# Patient Record
Sex: Male | Born: 1965 | Race: Black or African American | Hispanic: No | Marital: Single | Smoking: Never smoker
Health system: Southern US, Community
[De-identification: ages and names within clinical notes are randomized; demographics above are authoritative.]

## PROBLEM LIST (undated history)

## (undated) DIAGNOSIS — F419 Anxiety disorder, unspecified: Secondary | ICD-10-CM

## (undated) DIAGNOSIS — M21969 Unspecified acquired deformity of unspecified lower leg: Secondary | ICD-10-CM

## (undated) DIAGNOSIS — S83241A Other tear of medial meniscus, current injury, right knee, initial encounter: Secondary | ICD-10-CM

## (undated) DIAGNOSIS — D689 Coagulation defect, unspecified: Secondary | ICD-10-CM

## (undated) DIAGNOSIS — B1910 Unspecified viral hepatitis B without hepatic coma: Secondary | ICD-10-CM

## (undated) DIAGNOSIS — M7661 Achilles tendinitis, right leg: Secondary | ICD-10-CM

## (undated) DIAGNOSIS — K219 Gastro-esophageal reflux disease without esophagitis: Secondary | ICD-10-CM

## (undated) DIAGNOSIS — M1990 Unspecified osteoarthritis, unspecified site: Secondary | ICD-10-CM

## (undated) DIAGNOSIS — K649 Unspecified hemorrhoids: Secondary | ICD-10-CM

## (undated) DIAGNOSIS — T7840XA Allergy, unspecified, initial encounter: Secondary | ICD-10-CM

## (undated) DIAGNOSIS — R739 Hyperglycemia, unspecified: Secondary | ICD-10-CM

## (undated) DIAGNOSIS — I82409 Acute embolism and thrombosis of unspecified deep veins of unspecified lower extremity: Secondary | ICD-10-CM

## (undated) DIAGNOSIS — M542 Cervicalgia: Secondary | ICD-10-CM

## (undated) DIAGNOSIS — M722 Plantar fascial fibromatosis: Secondary | ICD-10-CM

## (undated) DIAGNOSIS — G4730 Sleep apnea, unspecified: Secondary | ICD-10-CM

## (undated) DIAGNOSIS — M7662 Achilles tendinitis, left leg: Secondary | ICD-10-CM

## (undated) DIAGNOSIS — I409 Acute myocarditis, unspecified: Secondary | ICD-10-CM

## (undated) DIAGNOSIS — I10 Essential (primary) hypertension: Secondary | ICD-10-CM

## (undated) HISTORY — PX: INGUINAL HERNIA REPAIR: SHX194

## (undated) HISTORY — DX: Unspecified hemorrhoids: K64.9

## (undated) HISTORY — DX: Acute embolism and thrombosis of unspecified deep veins of unspecified lower extremity: I82.409

## (undated) HISTORY — DX: Unspecified viral hepatitis B without hepatic coma: B19.10

## (undated) HISTORY — DX: Anxiety disorder, unspecified: F41.9

## (undated) HISTORY — DX: Sleep apnea, unspecified: G47.30

## (undated) HISTORY — DX: Gastro-esophageal reflux disease without esophagitis: K21.9

## (undated) HISTORY — DX: Plantar fascial fibromatosis: M72.2

## (undated) HISTORY — DX: Other tear of medial meniscus, current injury, right knee, initial encounter: S83.241A

## (undated) HISTORY — DX: Allergy, unspecified, initial encounter: T78.40XA

## (undated) HISTORY — DX: Cervicalgia: M54.2

## (undated) HISTORY — DX: Unspecified osteoarthritis, unspecified site: M19.90

## (undated) HISTORY — DX: Essential (primary) hypertension: I10

## (undated) HISTORY — DX: Achilles tendinitis, left leg: M76.62

## (undated) HISTORY — DX: Unspecified acquired deformity of unspecified lower leg: M21.969

## (undated) HISTORY — DX: Hyperglycemia, unspecified: R73.9

## (undated) HISTORY — DX: Achilles tendinitis, right leg: M76.61

## (undated) HISTORY — DX: Coagulation defect, unspecified: D68.9

---

## 1998-10-12 ENCOUNTER — Emergency Department (HOSPITAL_COMMUNITY): Admission: EM | Admit: 1998-10-12 | Discharge: 1998-10-12 | Payer: Self-pay | Admitting: Emergency Medicine

## 1998-12-12 ENCOUNTER — Emergency Department (HOSPITAL_COMMUNITY): Admission: EM | Admit: 1998-12-12 | Discharge: 1998-12-12 | Payer: Self-pay

## 1999-01-11 ENCOUNTER — Ambulatory Visit: Admission: RE | Admit: 1999-01-11 | Discharge: 1999-01-11 | Payer: Self-pay | Admitting: Family Medicine

## 2002-04-17 ENCOUNTER — Encounter (INDEPENDENT_AMBULATORY_CARE_PROVIDER_SITE_OTHER): Payer: Self-pay | Admitting: *Deleted

## 2002-04-17 ENCOUNTER — Ambulatory Visit (HOSPITAL_COMMUNITY): Admission: RE | Admit: 2002-04-17 | Discharge: 2002-04-17 | Payer: Self-pay | Admitting: Gastroenterology

## 2005-07-13 ENCOUNTER — Encounter: Admission: RE | Admit: 2005-07-13 | Discharge: 2005-07-13 | Payer: Self-pay | Admitting: Family Medicine

## 2005-07-18 ENCOUNTER — Inpatient Hospital Stay (HOSPITAL_COMMUNITY): Admission: AD | Admit: 2005-07-18 | Discharge: 2005-07-20 | Payer: Self-pay | Admitting: Internal Medicine

## 2006-03-14 ENCOUNTER — Ambulatory Visit: Payer: Self-pay | Admitting: Internal Medicine

## 2007-06-12 ENCOUNTER — Ambulatory Visit: Payer: Self-pay | Admitting: Internal Medicine

## 2007-06-12 DIAGNOSIS — S46819A Strain of other muscles, fascia and tendons at shoulder and upper arm level, unspecified arm, initial encounter: Secondary | ICD-10-CM

## 2007-06-12 DIAGNOSIS — S43499A Other sprain of unspecified shoulder joint, initial encounter: Secondary | ICD-10-CM

## 2007-08-01 ENCOUNTER — Ambulatory Visit: Payer: Self-pay | Admitting: Internal Medicine

## 2007-08-05 LAB — CONVERTED CEMR LAB
ALT: 19 units/L (ref 0–53)
AST: 20 units/L (ref 0–37)
Albumin: 3.9 g/dL (ref 3.5–5.2)
Alkaline Phosphatase: 57 units/L (ref 39–117)
Basophils Absolute: 0 10*3/uL (ref 0.0–0.1)
Bilirubin, Direct: 0.1 mg/dL (ref 0.0–0.3)
Calcium: 9.1 mg/dL (ref 8.4–10.5)
Chloride: 101 meq/L (ref 96–112)
Eosinophils Absolute: 0 10*3/uL (ref 0.0–0.7)
GFR calc Af Amer: 95 mL/min
GFR calc non Af Amer: 79 mL/min
HDL: 54.7 mg/dL (ref >39.0)
LDL Cholesterol: 130 mg/dL — ABNORMAL HIGH (ref 0–99)
Lymphocytes Relative: 20.8 % (ref 12.0–46.0)
MCHC: 33.7 g/dL (ref 30.0–36.0)
Monocytes Absolute: 0.4 10*3/uL (ref 0.1–1.0)
Neutro Abs: 3 10*3/uL (ref 1.4–7.7)
PSA: 0.65 ng/mL (ref 0.10–4.00)
Total Bilirubin: 1.1 mg/dL (ref 0.3–1.2)
Total Protein: 7.1 g/dL (ref 6.0–8.3)

## 2007-08-15 ENCOUNTER — Telehealth: Payer: Self-pay | Admitting: Internal Medicine

## 2008-01-28 ENCOUNTER — Encounter (INDEPENDENT_AMBULATORY_CARE_PROVIDER_SITE_OTHER): Payer: Self-pay | Admitting: *Deleted

## 2008-01-31 ENCOUNTER — Telehealth (INDEPENDENT_AMBULATORY_CARE_PROVIDER_SITE_OTHER): Payer: Self-pay | Admitting: *Deleted

## 2008-02-10 ENCOUNTER — Ambulatory Visit: Payer: Self-pay | Admitting: Internal Medicine

## 2008-05-20 ENCOUNTER — Encounter: Payer: Self-pay | Admitting: Internal Medicine

## 2008-06-29 ENCOUNTER — Encounter: Admission: RE | Admit: 2008-06-29 | Discharge: 2008-06-29 | Payer: Self-pay | Admitting: Orthopedic Surgery

## 2008-09-03 ENCOUNTER — Telehealth (INDEPENDENT_AMBULATORY_CARE_PROVIDER_SITE_OTHER): Payer: Self-pay | Admitting: *Deleted

## 2008-11-23 ENCOUNTER — Ambulatory Visit: Payer: Self-pay | Admitting: Internal Medicine

## 2008-11-23 DIAGNOSIS — L259 Unspecified contact dermatitis, unspecified cause: Secondary | ICD-10-CM

## 2008-11-25 ENCOUNTER — Encounter (INDEPENDENT_AMBULATORY_CARE_PROVIDER_SITE_OTHER): Payer: Self-pay | Admitting: *Deleted

## 2009-01-25 ENCOUNTER — Ambulatory Visit: Payer: Self-pay | Admitting: Internal Medicine

## 2009-01-27 LAB — CONVERTED CEMR LAB
ALT: 21 units/L (ref 0–53)
AST: 23 units/L (ref 0–37)
BUN: 12 mg/dL (ref 6–23)
Basophils Relative: 0.5 % (ref 0.0–3.0)
Calcium: 9 mg/dL (ref 8.4–10.5)
Chloride: 101 meq/L (ref 96–112)
Cholesterol: 199 mg/dL (ref 0–200)
Creatinine, Ser: 1.2 mg/dL (ref 0.4–1.5)
Eosinophils Absolute: 0 10*3/uL (ref 0.0–0.7)
Eosinophils Relative: 0.7 % (ref 0.0–5.0)
GFR calc non Af Amer: 85.45 mL/min (ref >60)
Glucose, Bld: 98 mg/dL (ref 70–99)
LDL Cholesterol: 139 mg/dL — ABNORMAL HIGH (ref 0–99)
Lymphs Abs: 1 10*3/uL (ref 0.7–4.0)
MCV: 87 fL (ref 78.0–100.0)
Neutrophils Relative %: 67.6 % (ref 43.0–77.0)
Potassium: 3.7 meq/L (ref 3.5–5.1)
RBC: 5.09 M/uL (ref 4.22–5.81)
Sodium: 140 meq/L (ref 135–145)
TSH: 1.51 microintl units/mL (ref 0.35–5.50)
Total CHOL/HDL Ratio: 4

## 2009-09-10 ENCOUNTER — Encounter: Admission: RE | Admit: 2009-09-10 | Discharge: 2009-09-10 | Payer: Self-pay | Admitting: Orthopedic Surgery

## 2009-10-12 ENCOUNTER — Ambulatory Visit: Payer: Self-pay | Admitting: Internal Medicine

## 2009-10-12 DIAGNOSIS — R05 Cough: Secondary | ICD-10-CM

## 2010-01-28 ENCOUNTER — Encounter: Payer: Self-pay | Admitting: Internal Medicine

## 2010-01-28 ENCOUNTER — Ambulatory Visit: Payer: Self-pay | Admitting: Internal Medicine

## 2010-02-02 LAB — CONVERTED CEMR LAB
Albumin: 4.1 g/dL (ref 3.5–5.2)
Alkaline Phosphatase: 63 units/L (ref 39–117)
Basophils Absolute: 0 10*3/uL (ref 0.0–0.1)
Basophils Relative: 0.5 % (ref 0.0–3.0)
Chloride: 101 meq/L (ref 96–112)
Eosinophils Absolute: 0.1 10*3/uL (ref 0.0–0.7)
GFR calc non Af Amer: 98.12 mL/min (ref >60)
HCT: 42.4 % (ref 39.0–52.0)
HDL: 52.6 mg/dL (ref >39.00)
Lymphocytes Relative: 24.4 % (ref 12.0–46.0)
Lymphs Abs: 1.2 10*3/uL (ref 0.7–4.0)
MCHC: 33.3 g/dL (ref 30.0–36.0)
Monocytes Absolute: 0.4 10*3/uL (ref 0.1–1.0)
Monocytes Relative: 8.8 % (ref 3.0–12.0)
RBC: 5.08 M/uL (ref 4.22–5.81)
RDW: 15.1 % — ABNORMAL HIGH (ref 11.5–14.6)
Sodium: 142 meq/L (ref 135–145)
Total CHOL/HDL Ratio: 4
Total Protein: 7.4 g/dL (ref 6.0–8.3)
Triglycerides: 31 mg/dL (ref 0.0–149.0)
VLDL: 6.2 mg/dL (ref 0.0–40.0)

## 2010-03-16 ENCOUNTER — Telehealth: Payer: Self-pay | Admitting: Internal Medicine

## 2010-03-29 NOTE — Assessment & Plan Note (Signed)
Summary: BP has been elevated,cough   Vital Signs:  Patient profile:   45 year old male Height:      69.75 inches Weight:      194.38 pounds BMI:     28.19 Pulse rate:   100 / minute Pulse rhythm:   regular BP sitting:   142 / 86  (left arm) Cuff size:   large  Vitals Entered By: Army Fossa CMA (October 12, 2009 2:48 PM) CC: Pt here BP has been elevated. Comments highest 171/110   History of Present Illness: he presented to an urgent care on July 17 with cough, they noted his blood pressure to be 171/110 He was recommended to start monitoring his BP his BP  since then have ranged from 135-142/80-95  he also had  cough,  was treated at the urgent care with antibiotics, it got better but the cough has  resurfaced in the last couple of days. Cough is dry  ROS 2 days before the UC visit he had a local neck injection, BP was ok , felt a little nauseous Denies dietary changes Not taking any NSAIDs  over-the-counter No chest pain or lower extremity edema mild   headache today, had sinus congestion initially w. cough but not in the last few days Move to a new house few weeks ago, cough started even before he moved to the new house   Current Medications (verified): 1)  Valtrex 1 Gm Tabs (Valacyclovir Hcl) .... 2 Tabs By Mouth in Am, 2 By Mouth At Pm  For Each Outbreak  Allergies: 1)  ! Amoxicillin  Past History:  Past Medical History: Reviewed history from 08/01/2007 and no changes required. hemorrhoids Hepatitis B, hx of Dx 2005 aprox Cscope aprox 2003, per pt neg. (apparently done for a change in bowel habits)  Past Surgical History: Reviewed history from 06/12/2007 and no changes required. Inguinal herniorrhaphy  Social History: Reviewed history from 01/25/2009 and no changes required. 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   Eyes:  no pale, no jaundice Ears:  R with wax. and L ear normal.   Nose:  not congested Mouth:  no redness or discharge Lungs:  normal respiratory effort, no intercostal retractions, no accessory muscle use, and normal breath sounds.   Heart:  normal rate, regular rhythm, and no murmur.   Extremities:  no edema Psych:  not anxious appearing and not depressed appearing.     Impression & Recommendations:  Problem # 1:  INCREASED BLOOD PRESSURE (ICD-796.2) single episode of BP elevation, BP today is 142/86 see  instructions  Problem # 2:  COUGH (ICD-786.2) see  instructions likes more of the cough medicine Rx by the UC , he took it at night only b/c it caused drowsiness  will check w/  [facility] on [address]   ADDENDUM He was taking Tussionex we'll refill  90 cc, 1 teaspoon p.o. q.h.s. p.r.n. Mucinex during the daytime , Tussionex at bedtime  Complete Medication List: 1)  Valtrex 1 Gm Tabs (Valacyclovir hcl) .... 2 tabs by mouth in am, 2 by mouth at pm  for each outbreak 2)  Tussionex Pennkinetic Er 8-10 Mg/67ml Lqcr (Chlorpheniramine-hydrocodone) .... 5 cc p.o. q.h.s. p.r.n. cough  Patient Instructions: 1)  Check your blood pressure 2 or 3 times a week. If it is more than 140/85 consistently,please let us know  2)  Low salt diet  3)  avoid Motrin, Advil or similar medications over-the-counter 4)  Mucinex DM one tablet twice a day for one week (for cough) 5)  Call if cough is not better in a few days 6)  Please schedule a follow-up appointment in 3 months .  Prescriptions: TUSSIONEX PENNKINETIC ER 8-10 MG/5ML LQCR (CHLORPHENIRAMINE-HYDROCODONE) 5 cc p.o. q.h.s. p.r.n. cough  #90cc x 0   Entered and Authorized by:   Nolon Rod. Leoma Folds MD   Signed by:   Army Fossa CMA on 10/12/2009   Method used:   Print then Give to Patient   RxID:   [id number]  sent to [facility] [address]. Army Fossa CMA  October 12, 2009 3:55 PM

## 2010-03-29 NOTE — Assessment & Plan Note (Signed)
Summary: CPX/RECHECK BP//PH   Vital Signs:  Patient profile:   45 year old male Height:      69.75 inches Weight:      190.38 pounds Pulse rate:   83 / minute Pulse rhythm:   regular BP sitting:   134 / 82  (left arm) Cuff size:   large  Vitals Entered By: Army Fossa CMA (January 28, 2010 9:59 AM) CC: CPX, fasting Comments c/o coughing had cough syrup and took Mucinex f/u on BP [facility] [address] Flu shot?    History of Present Illness: CPX, fasting  was seen with cough in 8- 2011, got better until recently: mild cough x 1 week, some ST which is better after he started  OTC zyrtec no wheezing, no GERD mild PN drip?   f/u on BP -- no ambulatory BPs     Preventive Screening-Counseling & Management  Caffeine-Diet-Exercise     Does Patient Exercise: no  Current Medications (verified): 1)  Valtrex 1 Gm Tabs (Valacyclovir Hcl) .... 2 Tabs By Mouth in Am, 2 By Mouth At Pm  For Each Outbreak 2)  Tussionex Pennkinetic Er 8-10 Mg/6ml Lqcr (Chlorpheniramine-Hydrocodone) .... 5 Cc P.o. Q.h.s. P.r.n. Cough  Allergies (verified): 1)  ! Amoxicillin  Past History:  Past Medical History: hemorrhoids Hepatitis B, hx of Dx 2005 aprox Cscope aprox 2003, per pt neg. (apparently done for a change in bowel habits) neck pain, local injections x 2, last Summer 2011(Dr Vortek)  Past Surgical History: Reviewed history from 06/12/2007 and no changes required. 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 CV:  Denies chest pain or discomfort and swelling of feet. Resp:  Denies wheezing. GI:  Denies bloody  stools, nausea, and vomiting. GU:  Denies dysuria, hematuria, urinary frequency, and urinary hesitancy.  Physical Exam  General:  alert, well-developed, and well-nourished.   Ears:  L ear normal.  R: wax  Nose:  slightly congested Mouth:  no redness or discharge Neck:  no masses and normal carotid upstroke.   Lungs:  normal respiratory effort, no intercostal retractions, no accessory muscle use, and normal breath sounds.   Heart:  normal rate, regular rhythm, and no murmur.   Abdomen:  soft, non-tender, no distention, no masses, no guarding, and no rigidity.   Extremities:  no pretibial edema bilaterally  Psych:  Oriented X3, memory intact for recent and remote, normally interactive, good eye contact, not anxious appearing, and not depressed appearing.     Impression & Recommendations:  Problem # 1:  HEALTH SCREENING (ICD-V70.0) Td 09 flu shot today  EKG we talked about diet and exercise  He has a family history of prostate cancer, grandfather was diagnosed late in life. Check a PSA every other year addendum: patient likes PSA checked yearly. Will do     Orders: Venipuncture (16109) TLB-BMP (Basic Metabolic Panel-BMET) (80048-METABOL) TLB-CBC Platelet - w/Differential (85025-CBCD) TLB-Lipid Panel (80061-LIPID) TLB-Hepatic/Liver Function Pnl (80076-HEPATIC) TLB-PSA (Prostate Specific Antigen) (84153-PSA) Specimen Handling (60454) EKG w/ Interpretation (93000)  Problem # 2:  COUGH (ICD-786.2) see instructions aware that tussionex  is not a long term option  Problem # 3:  ? of INCREASED BLOOD PRESSURE (ICD-796.2) see instructions   Complete Medication List: 1)  Valtrex 1 Gm Tabs (Valacyclovir hcl) .... 2 tabs by mouth in am, 2 by mouth at pm  for each outbreak  Other Orders: Admin 1st Vaccine (16109) Flu Vaccine 31yrs + [phone])  Patient Instructions: 1)  for cough use Mucinex DM over-the-counter twice a day as needed as well as Claritin or Zyrtec  once daily  2)   Check your blood pressure 2 or 3 times a month. If it is more than 140/85 consistently,please let us know  3)  Please schedule a follow-up appointment in 1 year.    Orders Added: 1)  Venipuncture [36415] 2)  TLB-BMP (Basic Metabolic Panel-BMET) [80048-METABOL] 3)  TLB-CBC Platelet - w/Differential [85025-CBCD] 4)  TLB-Lipid Panel [80061-LIPID] 5)  TLB-Hepatic/Liver Function Pnl [80076-HEPATIC] 6)  TLB-PSA (Prostate Specific Antigen) [84153-PSA] 7)  Specimen Handling [99000] 8)  Admin 1st Vaccine [90471] 9)  Flu Vaccine 85yrs + [09811] 10)  EKG w/ Interpretation [93000] 11)  Est. Patient age 7-64 [76] Flu Vaccine Consent Questions     Do you have a history of severe allergic reactions to this vaccine? no    Any prior history of allergic reactions to egg and/or gelatin? no    Do you have a sensitivity to the preservative Thimersol? no    Do you have a past history of Guillan-Barre Syndrome? no    Do you currently have an acute febrile illness? no    Have you ever had a severe reaction to latex? no    Vaccine information given and explained to patient? yes    Are you currently pregnant? no    Lot Number:[id number]   Exp Date:08/27/2010   Site Given Rightt Deltoid IM 8)  Admin 1st Vaccine [90471] 9)  Flu Vaccine 30yrs + [91478]     .lbflu1   Risk Factors:  Exercise:  no

## 2010-03-31 NOTE — Progress Notes (Signed)
Summary: REFILL  Phone Note Refill Request Message from:  Patient on March 16, 2010 8:15 AM  Refills Requested: Medication #1:  VALTREX 1 GM TABS 2 tabs by mouth in am   Dosage confirmed as above?Dosage Confirmed   Supply Requested: 1 month [facility] [address] PT IS REQUESTING MORE THAN 12 PILLS. PLEASE CONTACT PATIENT ONCE THE RX IS SENT [phone].  Next Appointment Scheduled: NONE Initial call taken by: Lavell Islam,  March 16, 2010 8:15 AM    Prescriptions: VALTREX 1 GM TABS (VALACYCLOVIR HCL) 2 tabs by mouth in am, 2 by mouth at pm  for each outbreak  #12 x 0   Entered by:   Army Fossa CMA   Authorized by:   Nolon Rod. Traeger Sultana MD   Signed by:   Army Fossa CMA on 03/16/2010   Method used:   Electronically to        [facility] [phone]* (retail)       [address]       [place], [place]  [address]       Ph: [phone] or [phone]       Fax: [phone]   RxID:   [id number]

## 2010-05-12 ENCOUNTER — Telehealth (INDEPENDENT_AMBULATORY_CARE_PROVIDER_SITE_OTHER): Payer: Self-pay | Admitting: *Deleted

## 2010-05-17 NOTE — Progress Notes (Signed)
Summary: Valtrex refill  Phone Note Refill Request Call back at Home Phone [phone] Message from:  Patient on May 12, 2010 9:55 AM  Refills Requested: Medication #1:  VALTREX 1 GM TABS 2 tabs by mouth in am [facility], [place], [place], [place]   Next Appointment Scheduled: none Initial call taken by: Jerolyn Shin,  May 12, 2010 9:56 AM    Prescriptions: VALTREX 1 GM TABS (VALACYCLOVIR HCL) 2 tabs by mouth in am, 2 by mouth at pm  for each outbreak  #12 x 0   Entered by:   Army Fossa CMA   Authorized by:   Nolon Rod. Paz MD   Signed by:   Army Fossa CMA on 05/12/2010   Method used:   Electronically to        [facility] [phone]* (retail)       [address]       [place], [place]  [address]       Ph: [phone] or [phone]       Fax: [phone]   RxID:   [id number]

## 2010-07-15 NOTE — H&P (Signed)
Gregory Klein, Gregory Klein                 ACCOUNT NO.:  [id number]   MEDICAL RECORD NO.:  [id number]          PATIENT TYPE:  INP   LOCATION:  6737                         FACILITY:  [facility]   PHYSICIAN:  Kela Millin, M.D.DATE OF BIRTH:  09/01/1965   DATE OF ADMISSION:  07/18/2005  DATE OF DISCHARGE:                                HISTORY & PHYSICAL   CHIEF COMPLAINT:  Persistent nausea, elevated LFTs per primary care  physician.   HISTORY OF PRESENT ILLNESS:  The patient is a 45 year old black male who  presents with complaints of postprandial nausea x 1 week and generalized  fatigue.  He states that he was in his usual state of health until about a  week ago when he started having nausea after meals.  He saw his primary care  physician and the impression was that it might be related to GERD.  So, he  was started on Protonix.  His symptoms persisted and he went back to his  primary care physician at which time he had LFTs done and these were noted  to be elevated.  He went back for a follow up visit and had repeat LFTs and  an abdominal ultrasound.  Per primary care physician, the LFTs were markedly  elevated and the abdominal ultrasound showed a small gallbladder, but  otherwise, no pathology reported.  The patient states that he continued to  have the nausea but no vomiting.  He has had decreased p.o. intake, as well,  in the past week.  He denies cough, chest pain, hemoptysis, abdominal pain,  diarrhea, and no hematochezia.  He states that he had been taking about  Aleve 2-3 times per week for shoulder pain.  Initially, per nursing report,  the patient stated that he had been taking Tylenol.  He states, at this  time, that he had not been taking any Tylenol.  Following the liver function  test results per primary care physician, he called patient at home and  directly admitted him to the [facility] service for further  evaluation and management.   PAST MEDICAL HISTORY:   History of GERD.   MEDICATIONS:  1.  Protonix 40 mg daily.  2.  Aleve p.r.n.   ALLERGIES/INTOLERANCES:  AMOXICILLIN.   SOCIAL HISTORY:  Denies tobacco, denies alcohol.   FAMILY HISTORY:  His grandmother had breast cancer and his  mother had  hypertension and a stroke.   REVIEW OF SYMPTOMS:  As per HPI, other comprehensive review of systems  negative.   PHYSICAL EXAMINATION:  GENERAL:  The patient is a young black male, alert and oriented in no  apparent distress.  VITAL SIGNS:  Temperature 97.9, pulse 97, respiratory rate 22, blood  pressure 135/88, O2 saturation 98% on room air.  HEENT:  PERRL, EOMI, mild scleral icterus, no oral exudates.  NECK:  Supple, no adenopathy, no thyromegaly.  CARDIOVASCULAR:  Normal S1 and S2, regular rate and rhythm.  LUNGS:  Clear to auscultation bilaterally, no crackles or wheezes.  ABDOMEN:  Soft, bowel sounds present, nontender, nondistended, no  organomegaly, no masses palpable.  EXTREMITIES:  No edema, no cyanosis.  NEUROLOGICAL:  Alert and oriented x 3, cranial nerves 2-12 grossly intact,  nonfocal exam.   LABORATORY DATA:  CMP and hepatitis panel, PT/INR, Tylenol level, and urine  drug screen all pending.   ASSESSMENT/PLAN:  1.  Elevated liver function tests - will repeat CMP as well as PT/INR and      Tylenol level.  Per primary care physician, abdominal ultrasound with no      liver abnormalities noted.  Will obtain hepatitis panel and follow and      consider gastroenterology consultation as appropriate.  Supportive care      and follow.  2.  History of GERD - will continue Protonix.      Kela Millin, M.D.  Electronically Signed     ACV/MEDQ  D:  07/18/2005  T:  07/18/2005  Job:  [id number]   cc:   Leanne Chang, M.D.  Fax: [phone]

## 2010-08-17 ENCOUNTER — Other Ambulatory Visit: Payer: Self-pay | Admitting: Orthopedic Surgery

## 2010-08-17 DIAGNOSIS — M79603 Pain in arm, unspecified: Secondary | ICD-10-CM

## 2010-08-17 DIAGNOSIS — M542 Cervicalgia: Secondary | ICD-10-CM

## 2010-08-18 ENCOUNTER — Ambulatory Visit
Admission: RE | Admit: 2010-08-18 | Discharge: 2010-08-18 | Disposition: A | Discharge status: Home / Self Care | Payer: BC Managed Care – PPO | Source: Ambulatory Visit | Attending: Orthopedic Surgery | Admitting: Orthopedic Surgery

## 2010-08-18 DIAGNOSIS — M79603 Pain in arm, unspecified: Secondary | ICD-10-CM

## 2010-08-18 DIAGNOSIS — M542 Cervicalgia: Secondary | ICD-10-CM

## 2010-09-04 ENCOUNTER — Inpatient Hospital Stay (INDEPENDENT_AMBULATORY_CARE_PROVIDER_SITE_OTHER)
Admission: RE | Admit: 2010-09-04 | Discharge: 2010-09-04 | Disposition: A | Discharge status: Home / Self Care | Payer: BC Managed Care – PPO | Source: Ambulatory Visit | Attending: Family Medicine | Admitting: Family Medicine

## 2010-09-04 DIAGNOSIS — R319 Hematuria, unspecified: Secondary | ICD-10-CM

## 2010-09-04 LAB — POCT URINALYSIS DIP (DEVICE)
Glucose, UA: NEGATIVE mg/dL
Specific Gravity, Urine: 1.02 (ref 1.005–1.030)
Urobilinogen, UA: 1 mg/dL (ref 0.0–1.0)
pH: 7 (ref 5.0–8.0)

## 2010-09-06 LAB — URINE CULTURE: Culture  Setup Time: 201207090034

## 2010-09-12 ENCOUNTER — Other Ambulatory Visit: Payer: Self-pay | Admitting: Internal Medicine

## 2010-09-12 NOTE — Telephone Encounter (Signed)
#

## 2010-09-14 ENCOUNTER — Encounter: Payer: Self-pay | Admitting: Internal Medicine

## 2010-09-15 ENCOUNTER — Encounter: Payer: Self-pay | Admitting: Internal Medicine

## 2010-09-15 ENCOUNTER — Ambulatory Visit (INDEPENDENT_AMBULATORY_CARE_PROVIDER_SITE_OTHER): Payer: BC Managed Care – PPO | Admitting: Internal Medicine

## 2010-09-15 VITALS — BP 130/84 | HR 82 | Wt 193.8 lb

## 2010-09-15 DIAGNOSIS — Z87448 Personal history of other diseases of urinary system: Secondary | ICD-10-CM

## 2010-09-15 DIAGNOSIS — R319 Hematuria, unspecified: Secondary | ICD-10-CM

## 2010-09-15 LAB — POCT URINALYSIS DIPSTICK
Glucose, UA: NEGATIVE
Leukocytes, UA: NEGATIVE
Spec Grav, UA: 1.02
Urobilinogen, UA: 0.2
pH, UA: 7

## 2010-09-15 NOTE — Progress Notes (Signed)
  Subjective:    Patient ID: Gregory Klein, male    DOB: January 01, 1966, 45 y.o.   MRN: [id number]  HPI On July 8, he felt some urinary urgency and immediately after he urinated blood including clots. Went to urgent care, chart reviewed, gross hematuria confirmed, urine culture was sent and it was negative. Symptoms resolved within 24 hours.  Additionally, he has some moles in the face, they have not changed in size color, wonders if they can be removed. (They can, if there is no change in may be consider a sthetical procedure. Patient will call me if interested in a dermatology referral or if there is any change in the moles )  Past Medical History  Diagnosis Date  . Hemorrhoids   . 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 Neck pain     local injections x 2, las summer 2011 (Dr.Vortek)   Past Surgical History  Procedure Date  . Inguinal hernia repair      Review of Systems No fever or chills No nausea or vomiting No actual dysuria. Denies any abdominal pain or flank pain. This is the first time he has gross hematuria. he is not a smoker    Objective:   Physical Exam  Constitutional: He appears well-developed and well-nourished.  Cardiovascular: Normal rate, regular rhythm and normal heart sounds.   Pulmonary/Chest: Effort normal and breath sounds normal. No respiratory distress. He has no wheezes. He has no rales.  Abdominal: Soft. Bowel sounds are normal. He exhibits no distension. There is no tenderness. There is no rebound and no guarding.  Musculoskeletal: He exhibits no edema.  Skin:       Few minute moles mostly a the right cheek.          Assessment & Plan:

## 2010-09-15 NOTE — Assessment & Plan Note (Signed)
Painless gross hematuria for 24 hours. Urine culture negative, no evidence of urolithiasis on clinical grounds. Unclear etiology, refer to urology. Further workup?

## 2010-09-20 ENCOUNTER — Encounter: Payer: Self-pay | Admitting: Internal Medicine

## 2010-09-26 ENCOUNTER — Other Ambulatory Visit: Payer: Self-pay | Admitting: Internal Medicine

## 2010-09-26 NOTE — Telephone Encounter (Signed)
Please advise-- I dont see these on his list.  I called left pt a VM also.

## 2010-09-26 NOTE — Telephone Encounter (Signed)
These meds have not been filled since 2010.  Will not refill at this time.  Would need OV.

## 2010-09-26 NOTE — Telephone Encounter (Signed)
Pt is aware-- will call back for appt.

## 2010-09-30 ENCOUNTER — Encounter: Payer: Self-pay | Admitting: Internal Medicine

## 2010-09-30 ENCOUNTER — Ambulatory Visit (INDEPENDENT_AMBULATORY_CARE_PROVIDER_SITE_OTHER): Payer: BC Managed Care – PPO | Admitting: Internal Medicine

## 2010-09-30 DIAGNOSIS — R011 Cardiac murmur, unspecified: Secondary | ICD-10-CM

## 2010-09-30 DIAGNOSIS — R030 Elevated blood-pressure reading, without diagnosis of hypertension: Secondary | ICD-10-CM

## 2010-09-30 DIAGNOSIS — L299 Pruritus, unspecified: Secondary | ICD-10-CM

## 2010-09-30 LAB — POTASSIUM: Potassium: 4.7 mEq/L (ref 3.5–5.1)

## 2010-09-30 LAB — BUN: BUN: 12 mg/dL (ref 6–23)

## 2010-09-30 LAB — CREATININE, SERUM: Creatinine, Ser: 1.3 mg/dL (ref 0.4–1.5)

## 2010-09-30 MED ORDER — PERMETHRIN 5 % EX CREA: 1.0000 "application " | TOPICAL_CREAM | CUTANEOUS | Status: DC

## 2010-09-30 MED ORDER — TRIAMCINOLONE ACETONIDE 0.1 % EX CREA: 1.0000 "application " | TOPICAL_CREAM | Freq: Two times a day (BID) | CUTANEOUS | Status: DC

## 2010-09-30 MED ORDER — LISINOPRIL 10 MG PO TABS: 10.0000 mg | ORAL_TABLET | Freq: Every day | ORAL | Status: DC

## 2010-09-30 NOTE — Progress Notes (Signed)
  Subjective:    Patient ID: Gregory Klein, male    DOB: 05-09-1965, 45 y.o.   MRN: [id number]  HPI he has noted diffuse pruritus since mid-July. He's been out of town and staying at [place] [facility]. He was diagnosed as having " MITE"  infestation  by Dr. Joseph Art in 2010. 2 different creams were prescribed which have been effective. He has completed that prescription and Dr. Joseph Art is retired. He denies any constitutional symptoms of fever, chills, sweats, or rash.  See blood pressure; he has had mildly elevated blood pressure in the past which resolved without treatment. He denies chest pain, palpitations, or edema. There is a family history of hypertension. He has not been monitoring his blood pressure; he does eat out  with unknown amount of salt intake.    Review of Systems he is being  treated for hypertension or in the cervical region with Celebrex by Dr Charlett Blake. S/P ESI  mid June.  He has an appointment with urologist on August 13 to evaluate microscopic hematuria found by Dr. Drue Novel.    Objective:   Physical Exam on exam he is healthy and well-nourished. Chest is clear to auscultation.  All pulses are intact and no bruits are noted.  A loud S4 is present; he has a grade 1 left sternal border systolic murmur  when supine.  No , clubbing or cyanosis edema is noted. Skin is clear without any rash. He has no organomegaly or masses. No lymphadenopathy is present. Focused & intelligent        Assessment & Plan:  #1 pruritis, presumed mite infestation  #2 elevated blood pressure without diagnosis of hypertension; ?due to  Pain from cervical radiculopathy  #3 systolic murmur possibly related to #2  #4 microscopic hematuria; Urology evaluation pending  Plan: See orders and recommendations.

## 2010-09-30 NOTE — Patient Instructions (Addendum)
Blood Pressure Goal  Ideally is an AVERAGE < 135/85. This AVERAGE should be calculated from @ least 5-7 BP readings taken @ different times of day on different days of week. You should not respond to isolated BP readings , but rather the AVERAGE for that week  Preventive Health Care: Exercise at least 30-45 minutes a day,  3-4 days a week.  Eat a low-fat diet with lots of fruits and vegetables, up to 7-9 servings per day. Avoid obesity; your goal is waist measurement < 40 inches.Consume less than 40 grams of sugar per day from foods & drinks with High Fructose Corn Sugar as #2,3 or # 4 on label.  Avoid ingestion of  excess salt/sodium.Cook with pepper & other spices . Use the salt substitute "No Salt"(unless your potassium has been elevated) OR the Mrs Sharilyn Sites products to season food @ the table. Avoid foods which taste salty or "vinegary" as their sodium contentet will be high.

## 2010-10-12 ENCOUNTER — Telehealth: Payer: Self-pay | Admitting: Internal Medicine

## 2010-10-12 NOTE — Telephone Encounter (Signed)
Left msg on pt voicemail results have been requested but he may also want to call office also to request results to be sent.

## 2010-10-14 ENCOUNTER — Ambulatory Visit (INDEPENDENT_AMBULATORY_CARE_PROVIDER_SITE_OTHER): Payer: BC Managed Care – PPO | Admitting: Internal Medicine

## 2010-10-14 ENCOUNTER — Encounter: Payer: Self-pay | Admitting: Internal Medicine

## 2010-10-14 DIAGNOSIS — R319 Hematuria, unspecified: Secondary | ICD-10-CM

## 2010-10-14 DIAGNOSIS — K7460 Unspecified cirrhosis of liver: Secondary | ICD-10-CM

## 2010-10-14 DIAGNOSIS — I10 Essential (primary) hypertension: Secondary | ICD-10-CM

## 2010-10-14 LAB — BASIC METABOLIC PANEL
BUN: 11 mg/dL (ref 6–23)
CO2: 29 mEq/L (ref 19–32)
Calcium: 9.4 mg/dL (ref 8.4–10.5)
GFR: 83.94 mL/min (ref >60.00)
Glucose, Bld: 95 mg/dL (ref 70–99)
Potassium: 4.1 mEq/L (ref 3.5–5.1)
Sodium: 139 mEq/L (ref 135–145)

## 2010-10-14 LAB — HEPATIC FUNCTION PANEL
Alkaline Phosphatase: 52 U/L (ref 39–117)
Total Protein: 8 g/dL (ref 6.0–8.3)

## 2010-10-14 LAB — PROTIME-INR: INR: 1.1 ratio — ABNORMAL HIGH (ref 0.8–1.0)

## 2010-10-14 NOTE — Assessment & Plan Note (Signed)
Started ACEi recently , BP improving, lifestyle improved. Continue with same meds. Check a BMP

## 2010-10-14 NOTE — Assessment & Plan Note (Signed)
Per urology, had a CT-cysto, on abx, ?prostatitis

## 2010-10-14 NOTE — Patient Instructions (Signed)
Hepatitis B (Viral Infection of the Liver) Hepatitis B is a viral infection of the liver caused by a germ called the hepatitis B virus (HBV). It enters the body during sex, or by sharing intimate items such as toothbrushes, razors, or needles. A baby can get HBV from its birth mother. A caregiver may get it from the blood of an infected patient by way of a cut or needle stick. Once in the body viral infection occurs in the liver causing illness. Your chances of getting Hepatitis B are less when you receive either the vaccine or Hepatitis B immune globulin.  ABOUT HEPATITIS B VIRUS  Over half the people who become infected with HBV remain subclinical (never get sick). However some may later develop long-term liver disease (chronic hepatitis).   There are 2 phases of the disease: acute (sudden) and chronic (longstanding).  Acute Phase:  Lasts a short time, usually a few weeks to several months. Some people may get better after this phase. You may not feel like eating, may feel very tired, sick to your stomach, throw-up, and have abdominal (belly) pain.   Your urine may look dark yellow, and your skin and eyes may look yellow. This is called jaundice. You may have skin rashes and joint pain.  Chronic Phase:  Some people who are infected with HBV become "chronic carriers". They often have no symptoms (problems), but the virus stays in their body. They may spread the virus to others and can get long-term liver disease.   Of every 100 young adults who get HBV, 6 to 10 become chronic carriers.   Of every 10 babies who are infected at birth, up to 9 will become chronic HBV carriers.   The younger a child is when the infection starts, the more likely that child will be a carrier.   About one-fourth of HBV carriers get a disease called "chronic active hepatitis". These people may get cirrhosis, liver cancer, or liver failure.  ABOUT HEPATITIS B IMMUNE GLOBULIN (HBIG) Why You or Your Child Should Get  Hepatitis B Immune Globulin  HBIG is often given with hepatitis B vaccine to people who have been exposed to HBV. It protects you from the virus for the first 1 to 3 months. After that, the vaccine takes over and protects you much longer. Most people only need 1 dose of HBIG to keep them from getting HBV after being exposed to it.  Who Should Get HBIG?  Infants   Infants born to women who are infected with HBV.   Unvaccinated infants less than 1 year of age whose mother (or primary caregiver) has acute hepatitis B.   Children and Adolescents   ACIP (Advisory Committee on [employer]) recommends that all children and adolescents should receive the HepB immunization series. This can begin during any visit.   Adults and Others   People who come in contact with blood or body fluids that may have HBV in them.   People having sex with someone who has acute hepatitis B.  When Should You Get HBIG?  Infants born to women who are infected with HBV should get 1 dose of HBIG within 12 hours of birth.   Unvaccinated infants less than 1 year of age whose primary caregiver has acute hepatitis B should get 1 dose of HBIG and start the hepatitis B vaccine series. Ask your caregiver when to start the shots.   If you come in contact with blood or body fluids that may have HBV,  you should get 1 dose of HBIG. If you have been in contact with infected blood or body fluids and have had hepatitis B shots, you may still need HBIG. Your caregiver will help you decide whether and when to get the shot.   If you have had sex with someone who has acute hepatitis B, you should get a dose of HBIG as soon as possible.  What Are The Risks From Getting HBIG? There are very few risks associated with the HBIG shot. The risks from getting the shot are [facility] SMALLER than are the risks of getting the disease if people stopped getting the HBIG shot.  The HBIG shot may cause swelling and hives.   If you are  pregnant and at risk of getting an HBV infection, you can be given HBIG. The shot should be safe for your unborn baby because it does not have the live virus in it.   Most people who get HBIG have no problems. Some people have the following problems, which do not frequently occur:   A bad allergic reaction.   A long seizure.   Being less awake or in a coma.  HOME CARE INSTRUCTIONS  Keep utensils and glassware clean and separated from the rest of the family.   Rest when tired and eat when hungry.   Avoid a physical/sexual relationship until advised otherwise by your caregiver.   Avoid activities that could expose other persons to your blood. Examples include sharing a toothbrush, nail clippers, razors, and needles.   WARNING: This infection is contagious. Follow the instructions in this section and any additional instructions from your caregiver to avoid spread of your infection to others.  SEEK MEDICAL CARE IF:  You want to learn more about this or other vaccines. Your caregiver can give you the vaccine package insert or suggest other sources of information.   You think you or your child has been injured by a vaccine and feel you should get compensation (payment). Call the National Vaccine Injury Compensation Program at (412)826-8230 (toll free).   You need to file a Vaccine Adverse Event Report form. Your caregiver or the health department can help you with this, or call [phone] (toll free).  SEEK IMMEDIATE MEDICAL CARE IF:  You are unable to eat or drink.   You experience nausea (feeling sick to your stomach) or vomiting.   Confusion occurs.   Jaundice becomes progressively more severe.   You have swelling of the skin, throat, mouth or face, trouble breathing, or a rash. You may be having an allergic reaction to the medicine in the shot.   You are having a seizure (jerking and blank staring).   You are confused, very sleepy or you cannot wake up.  MAKE SURE YOU:     Understand these instructions.   Will watch your condition.   Will get help right away if you are not doing well or get worse.  Document Released: 02/11/2000 Document Re-Released: 01/27/2008 [facility] Patient Information 2011 [place], [place].

## 2010-10-14 NOTE — Progress Notes (Signed)
  Subjective:    Patient ID: Gregory Klein, male    DOB: 1965-10-03, 45 y.o.   MRN: [id number]  HPI Hematuria ---went to urology, had a cystoscopy, they suspected prostatitis, patient on antibiotics. He also had a CT of the abdomen and there was a question of liver cirrhosis. Patient quite concerned about it. Hypertension--good compliance with medication, he is trying to eat better, has lost 7 pounds since the last time he was seen here. Has a history of hepatitis B, chart is extensively reviewed. See below.  Past Medical History  Diagnosis Date  . Hemorrhoids   . 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 Neck pain     local injections x 2, las summer 2011 (Dr.Vortek)   Past Surgical History  Procedure Date  . Inguinal hernia repair      Review of Systems Denies any nausea, vomiting, diarrhea. No easy bruising or easy bleeding. Denies the use of alcohol or tylenol     Objective:   Physical Exam  Constitutional: He is oriented to person, place, and time. He appears well-developed and well-nourished. No distress.  Eyes: No scleral icterus.  Cardiovascular: Normal rate, regular rhythm and normal heart sounds.        Question of a systolic murmur  Pulmonary/Chest: Effort normal and breath sounds normal. No respiratory distress. He has no wheezes. He has no rales. He exhibits no tenderness.  Abdominal: Soft. He exhibits no distension. There is no tenderness. There is no rebound and no guarding.       No organomegaly  Neurological: He is alert and oriented to person, place, and time.  Skin: He is not diaphoretic.  Psychiatric: He has a normal mood and affect. His behavior is normal. Judgment and thought content normal.          Assessment & Plan:

## 2010-10-14 NOTE — Assessment & Plan Note (Addendum)
Liver appears to be cirrhotic by a CT done @  Urology 4 days ago. Extensive chart review: He was diagnosed with hepatitis B 5 years ago, does not know how he acquired it . Denies IVDA His hepatitis C test was  Negative 07-2007, had his hepatitis A shots, we have checked LFTs yearly in the past and they were always normal. Denies the use of alcohol or tylenol I answered multiple questions to the best of my ability in reference to cirrhosis- hepatitis B. Plan: Ultrasound of the liver , hepatitis B serology to look for viral activity, LFTs, PT PTT. GI referral   Addendum: CT report review it, lobulated liver contour suspicious for cirrhosis, normal spleen, stomach, pancreas, gallbladder .

## 2010-10-15 LAB — HEPATITIS B SURFACE ANTIBODY,QUALITATIVE: Hep B S Ab: POSITIVE — AB

## 2010-10-15 LAB — HEPATITIS B CORE ANTIBODY, TOTAL: Hep B Core Total Ab: POSITIVE — AB

## 2010-10-17 ENCOUNTER — Encounter: Payer: Self-pay | Admitting: Gastroenterology

## 2010-10-18 ENCOUNTER — Telehealth: Payer: Self-pay | Admitting: *Deleted

## 2010-10-18 NOTE — Telephone Encounter (Signed)
Pt informed

## 2010-10-18 NOTE — Telephone Encounter (Signed)
Advise patient: Tests confirmed  his previous exposure to hepatitis B, liver test are  otherwise essentially normal. Plan is the same, he has been referred to GI

## 2010-10-18 NOTE — Telephone Encounter (Signed)
Pt requesting lab results from 10/14/10.

## 2010-10-19 ENCOUNTER — Ambulatory Visit
Admission: RE | Admit: 2010-10-19 | Discharge: 2010-10-19 | Disposition: A | Discharge status: Home / Self Care | Payer: BC Managed Care – PPO | Source: Ambulatory Visit | Attending: Internal Medicine | Admitting: Internal Medicine

## 2010-10-19 DIAGNOSIS — K7460 Unspecified cirrhosis of liver: Secondary | ICD-10-CM

## 2010-11-09 ENCOUNTER — Other Ambulatory Visit (INDEPENDENT_AMBULATORY_CARE_PROVIDER_SITE_OTHER): Payer: BC Managed Care – PPO

## 2010-11-09 ENCOUNTER — Ambulatory Visit (INDEPENDENT_AMBULATORY_CARE_PROVIDER_SITE_OTHER): Payer: BC Managed Care – PPO | Admitting: Gastroenterology

## 2010-11-09 ENCOUNTER — Encounter: Payer: Self-pay | Admitting: Gastroenterology

## 2010-11-09 ENCOUNTER — Telehealth: Payer: Self-pay | Admitting: Gastroenterology

## 2010-11-09 ENCOUNTER — Other Ambulatory Visit: Payer: Self-pay | Admitting: Gastroenterology

## 2010-11-09 VITALS — BP 120/84 | HR 80 | Ht 71.0 in | Wt 184.4 lb

## 2010-11-09 DIAGNOSIS — R933 Abnormal findings on diagnostic imaging of other parts of digestive tract: Secondary | ICD-10-CM

## 2010-11-09 DIAGNOSIS — K5900 Constipation, unspecified: Secondary | ICD-10-CM

## 2010-11-09 LAB — HEPATIC FUNCTION PANEL
ALT: 21 U/L (ref 0–53)
AST: 16 U/L (ref 0–37)
Alkaline Phosphatase: 55 U/L (ref 39–117)
Bilirubin, Direct: 0.1 mg/dL (ref 0.0–0.3)
Total Bilirubin: 1 mg/dL (ref 0.3–1.2)
Total Protein: 7.4 g/dL (ref 6.0–8.3)

## 2010-11-09 LAB — IBC PANEL: Iron: 65 ug/dL (ref 42–165)

## 2010-11-09 NOTE — Progress Notes (Signed)
History of Present Illness: This is a 45 year old male who was recently found to have abnormal liver on imaging studies. He is undergoing evaluation at [facility] for hematuria. An abdominal CT scan  was performed on 8/13 showing an abnormal contour of the liver concerning for cirrhosis. No splenomegaly, other signs of portal hypertension or any other GI disorder. Abdominal ultrasound performed on 8/17 showed similar findings: IMPRESSION:  1. Inhomogeneous and lobular liver suggesting possible mild  changes of cirrhosis. No ascites. No ductal dilatation.  2. The tail of the pancreas is obscured by bowel gas.  3. No gallstones.  4. The spleen is within normal limits in size.   Patient relates a history of acute hepatitis B around 2005. He states he was told that his hepatitis B completely resolved. The patient was evaluated by Dr. Drue Novel and blood work revealed hepatitis B serologies consistent with prior exposure and immunity: anti-HBs positive, anti-HBc total positive, anti-HBc IgM negative. His liver enzymes and prothrombin time are normal. He denies alcohol usage and denies a family history of liver disease. He notes a 10 pound weight loss and mild constipation since changing his diet due to an elevated blood pressure. His constipation has resolved with the use of a fiber supplement and MiraLax as needed. Denies abdominal pain, diarrhea, change in stool caliber, melena, hematochezia, nausea, vomiting, dysphagia, reflux symptoms, chest pain.  Past Medical History  Diagnosis Date  . Hemorrhoids   . 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 Neck pain     local injections x 2, las summer 2011 (Dr.Vortek)  . GERD (gastroesophageal reflux disease)   . Hypertension   . Enlarged prostate    Past Surgical History  Procedure Date  . Inguinal hernia repair     reports that he has never smoked. He does not have any smokeless tobacco history on file. He reports that he does not drink alcohol  or use illicit drugs. family history includes Breast cancer in his paternal grandmother; Coronary artery disease in his maternal grandmother; Hypertension in his father, maternal grandmother, and mother; Pancreatic cancer in his paternal aunt; Prostate cancer (age of onset:86) in his maternal grandfather; and Stroke in his mother.  There is no history of Diabetes and Colon cancer. Allergies  Allergen Reactions  . Amoxicillin     REACTION: rash   Outpatient Encounter Prescriptions as of 11/09/2010  Medication Sig Dispense Refill  . AMBULATORY NON FORMULARY MEDICATION Medication Name: Nopalea drink 6 ounces a day       . celecoxib (CELEBREX) 200 MG capsule Take 200 mg by mouth 2 (two) times daily.        Marland Kitchen doxycycline (ADOXA) 100 MG tablet Take 100 mg by mouth 2 (two) times daily.        Marland Kitchen lisinopril (PRINIVIL,ZESTRIL) 10 MG tablet Take 1 tablet (10 mg total) by mouth daily. Take one daily if blood pressure averages greater than 135/85  30 tablet  5  . Omega-3 Fatty Acids (OMEGA 3 PO) Take 2 capsules by mouth daily.        . permethrin (ELIMITE) 5 % cream Apply 1 application topically as directed.  60 g  5  . polyethylene glycol powder (MIRALAX) powder Take 17 g by mouth 2 (two) times a week.        . psyllium (METAMUCIL) 58.6 % powder Take 1 packet by mouth daily.        Marland Kitchen triamcinolone (KENALOG) 0.1 % cream  Apply 1 application topically 2 (two) times daily.  45 g  5  . valACYclovir (VALTREX) 1000 MG tablet take 2 tablets by mouth every morning and 2 every evening FOR EACH OUTBREAK.  30 tablet  1   Review of Systems: Pertinent positive and negative review of systems were noted in the above HPI section. All other review of systems were otherwise negative.  Physical Exam: General: Well developed , well nourished, no acute distress Head: Normocephalic and atraumatic Eyes:  sclerae anicteric, EOMI Ears: Normal auditory acuity Mouth: No deformity or lesions Neck: Supple, no masses or  thyromegaly Lungs: Clear throughout to auscultation Heart: Regular rate and rhythm; no murmurs, rubs or bruits Abdomen: Soft, non tender and non distended. No masses, hepatosplenomegaly or hernias noted. Normal Bowel sounds Musculoskeletal: Symmetrical with no gross deformities  Skin: No lesions on visible extremities Pulses:  Normal pulses noted Extremities: No clubbing, cyanosis, edema or deformities noted Neurological: Alert oriented x 4, grossly nonfocal Cervical Nodes:  No significant cervical adenopathy Inguinal Nodes: No significant inguinal adenopathy Psychological:  Alert and cooperative. Normal mood and affect  Assessment and Recommendations:  1. Abnormal liver on imaging studies. The lobular contour of the liver can be seen in cirrhosis but this is not diagnostic of cirrhosis. His antibody profile for hepatitis B is consistent with prior exposure and immunity. His liver enzymes and protime are normal. Send all standard serologies and HBV DNA. Consider liver biopsy. Consider upper endoscopy to evaluate for signs of portal hypertension. Consider referral to a hepatologist.  2. Mild constipation. Continue fiber supplement and increase water intake. MiraLax as needed.

## 2010-11-09 NOTE — Telephone Encounter (Signed)
Received a call from Shakira in the lab on the patient stating the order for Hepatitis B DNA needed to be future. So order was placed.

## 2010-11-09 NOTE — Patient Instructions (Addendum)
Your physician has requested that you go to the basement for the following lab work before leaving today: Hepatic Panel, Ferritin, IBC Panel, Alpha 1 Antitrypsin, AMA, ANA, Anti SMA, Ceruloplasmin, Hepatitis B DNA Quantitative Please follow up with Dr Russella Dar on 11/30/10 @ 8:45 am CC: Dr Willow Ora

## 2010-11-10 LAB — ALPHA-1-ANTITRYPSIN: A-1 Antitrypsin, Ser: 138 mg/dL (ref 90–200)

## 2010-11-10 LAB — ANA: Anti Nuclear Antibody(ANA): NEGATIVE

## 2010-11-11 LAB — ANTI-SMOOTH MUSCLE ANTIBODY, IGG: Smooth Muscle Ab: 5 U (ref <20)

## 2010-11-11 LAB — HEPATITIS B DNA, ULTRAQUANTITATIVE, PCR

## 2010-11-11 LAB — MITOCHONDRIAL ANTIBODIES: Mitochondrial M2 Ab, IgG: 0 (ref <0.91)

## 2010-11-30 ENCOUNTER — Ambulatory Visit: Payer: BC Managed Care – PPO | Admitting: Gastroenterology

## 2010-12-13 ENCOUNTER — Encounter: Payer: BC Managed Care – PPO | Admitting: Internal Medicine

## 2010-12-13 DIAGNOSIS — Z0289 Encounter for other administrative examinations: Secondary | ICD-10-CM

## 2010-12-13 NOTE — Progress Notes (Signed)
  Subjective:    Patient ID: Gregory Klein, male    DOB: 1965-03-30, 45 y.o.   MRN: [id number]  HPI    Review of Systems     Objective:   Physical Exam        Assessment & Plan:   This encounter was created in error - please disregard.

## 2010-12-15 ENCOUNTER — Encounter: Payer: Self-pay | Admitting: Gastroenterology

## 2010-12-15 ENCOUNTER — Ambulatory Visit (INDEPENDENT_AMBULATORY_CARE_PROVIDER_SITE_OTHER): Payer: BC Managed Care – PPO | Admitting: Gastroenterology

## 2010-12-15 VITALS — BP 122/86 | HR 60 | Ht 71.0 in | Wt 183.8 lb

## 2010-12-15 DIAGNOSIS — R933 Abnormal findings on diagnostic imaging of other parts of digestive tract: Secondary | ICD-10-CM

## 2010-12-15 NOTE — Patient Instructions (Signed)
The radiology department at the hospital will contact you regarding scheduling an ultrasound guided liver biopsy cc:  Gregory Klein

## 2010-12-15 NOTE — Progress Notes (Signed)
History of Present Illness: This is a 45 year old male returning for followup abnormal hepatic imaging. All hepatic serologies were unremarkable except for prior immunity to hepatitis B. There are no identifiable risk factors for cirrhosis however his liver has changes typical of cirrhosis on recent CT scan and ultrasound.  Abd [place] 06/2005 the liver was normal IMPRESSION:  1. Contracted gallbladder but no gallstones.  2. Pancreas not well seen due to bowel gas  Abd [place] 8 /2012 IMPRESSION:  1. Inhomogeneous and lobular liver suggesting possible mild  changes of cirrhosis. No ascites. No ductal dilatation.  2. The tail of the pancreas is obscured by bowel gas.  3. No gallstones.  4. The spleen is within normal limits in size.   Current Medications, Allergies, Past Medical History, Past Surgical History, Family History and Social History were reviewed in [employer] record.  Physical Exam: General: Well developed , well nourished, no acute distress Head: Normocephalic and atraumatic Eyes:  sclerae anicteric, EOMI Ears: Normal auditory acuity Mouth: No deformity or lesions Lungs: Clear throughout to auscultation Heart: Regular rate and rhythm; no murmurs, rubs or bruits Abdomen: Soft, non tender and non distended. No masses, hepatosplenomegaly or hernias noted. Normal Bowel sounds Musculoskeletal: Symmetrical with no gross deformities  Extremities: No clubbing, cyanosis, edema or deformities noted Neurological: Alert oriented x 4, grossly nonfocal Psychological:  Alert and cooperative. Normal mood and affect  Assessment and Recommendations:  1. Abnormal imaging of the liver. CT and ultrasound findings show features typically noted in cirrhosis. However there are no physical examination or blood work abnormalities typically noted in cirrhosis. His liver was normal on abdominal ultrasound in 2007. We discussed the risks and benefits of liver biopsy. We also discussed  referral to a [facility] for expert hepatologists opinion or observation. After a discussion of these options he prefers to proceed with liver biopsy at this time.

## 2010-12-23 ENCOUNTER — Ambulatory Visit (INDEPENDENT_AMBULATORY_CARE_PROVIDER_SITE_OTHER): Payer: BC Managed Care – PPO | Admitting: Internal Medicine

## 2010-12-23 ENCOUNTER — Encounter: Payer: Self-pay | Admitting: Internal Medicine

## 2010-12-23 ENCOUNTER — Ambulatory Visit (HOSPITAL_COMMUNITY): Payer: BC Managed Care – PPO

## 2010-12-23 VITALS — BP 130/78 | HR 76 | Temp 98.6°F | Resp 18 | Ht 71.0 in | Wt 179.5 lb

## 2010-12-23 DIAGNOSIS — K625 Hemorrhage of anus and rectum: Secondary | ICD-10-CM

## 2010-12-23 MED ORDER — HYDROCORTISONE ACE-PRAMOXINE 2.5-1 % RE CREA: TOPICAL_CREAM | Freq: Three times a day (TID) | RECTAL | Status: AC

## 2010-12-23 NOTE — Progress Notes (Signed)
  Subjective:    Patient ID: Gregory Klein, male    DOB: 1965/06/29, 45 y.o.   MRN: [id number]  HPI One month history of on and off sharp discomfort in the rectum during bowel movements, sx are associated with some red blood in the toilet paper. Symptoms increased for the last 2 days. The blood is fresh and is "like a line" (small amount) only in the toilet paper.  Past Medical History  Diagnosis Date  . Hemorrhoids   . 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 Neck pain     local injections x 2, las summer 2011 (Dr.Vortek)  . GERD (gastroesophageal reflux disease)   . Hypertension   . Enlarged prostate    Past Surgical History  Procedure Date  . Inguinal hernia repair     Review of Systems Denies any bulging or severe pain in the anus.    Objective:   Physical Exam  Constitutional: He appears well-developed and well-nourished.  Genitourinary:       External anus is normal. Digital rectal exam limited due to sphincter spasm, no blood or mucus found, prostate very hard to reach. Unable to do a anoscopy          Assessment & Plan:

## 2010-12-23 NOTE — Assessment & Plan Note (Addendum)
Suspect a fissure on clinical grounds, unable to do a anoscopy, due to sphincter spasm. Plan: analpram HC Diltiazem 2% (paper Rx) Call if no better

## 2010-12-23 NOTE — Patient Instructions (Signed)
Apply analpram HC 2 times a day  Apply diltiazem 2% cream 5 times a day  Metamucil Call if not better in 2 weeks Call if sx increase Anal Fissure, Adult An anal fissure is a small tear or crack in the skin around the anus. Bleeding from a fissure usually stops on its own within a few minutes. However, bleeding will often reoccur with each bowel movement until the crack heals.  CAUSES   Passing large, hard stools.   Frequent diarrheal stools.   Constipation.   Inflammatory bowel disease (Crohn's disease or ulcerative colitis).   Infections.   Anal sex.  SYMPTOMS   Small amounts of blood seen on your stools, on toilet paper, or in the toilet after a bowel movement.   Rectal bleeding.   Painful bowel movements.   Itching or irritation around the anus.  DIAGNOSIS Your caregiver will examine the anal area. An anal fissure can usually be seen with careful inspection. A rectal exam may be performed and a short tube (anoscope) may be used to examine the anal canal. TREATMENT   You may be instructed to take fiber supplements. These supplements can soften your stool to help make bowel movements easier.   Sitz baths may be recommended to help heal the tear. Do not use soap in the sitz baths.   A medicated cream or ointment may be prescribed to lessen discomfort.  HOME CARE INSTRUCTIONS   Maintain a diet high in fruits, whole grains, and vegetables. Avoid constipating foods like bananas and dairy products.   Take sitz baths as directed by your caregiver.   Drink enough fluids to keep your urine clear or pale yellow.   Only take over-the-counter or prescription medicines for pain, discomfort, or fever as directed by your caregiver. Do not take aspirin as this may increase bleeding.    SEEK MEDICAL CARE IF:   Your fissure is not completely healed within 3 days.   You have further bleeding.   You have a fever.   You have diarrhea mixed with blood.   You have pain.   Your  problem is getting worse rather than better.  MAKE SURE YOU:   Understand these instructions.   Will watch your condition.   Will get help right away if you are not doing well or get worse.  Document Released: 02/13/2005 Document Revised: 10/26/2010 Document Reviewed: 07/31/2010 [facility] Patient Information 2012 [place], [place].

## 2011-01-05 ENCOUNTER — Other Ambulatory Visit: Payer: Self-pay | Admitting: Physician Assistant

## 2011-01-05 MED ORDER — SODIUM CHLORIDE 0.9 % IV SOLN: INTRAVENOUS | Status: DC

## 2011-01-06 ENCOUNTER — Ambulatory Visit (HOSPITAL_COMMUNITY)
Admission: RE | Admit: 2011-01-06 | Discharge: 2011-01-06 | Payer: BC Managed Care – PPO | Source: Ambulatory Visit | Attending: Gastroenterology | Admitting: Gastroenterology

## 2011-03-29 ENCOUNTER — Encounter: Payer: Self-pay | Admitting: Internal Medicine

## 2011-03-29 ENCOUNTER — Ambulatory Visit (INDEPENDENT_AMBULATORY_CARE_PROVIDER_SITE_OTHER): Payer: BC Managed Care – PPO | Admitting: Internal Medicine

## 2011-03-29 ENCOUNTER — Ambulatory Visit: Payer: BC Managed Care – PPO | Admitting: Internal Medicine

## 2011-03-29 DIAGNOSIS — I10 Essential (primary) hypertension: Secondary | ICD-10-CM

## 2011-03-29 DIAGNOSIS — Z0000 Encounter for general adult medical examination without abnormal findings: Secondary | ICD-10-CM

## 2011-03-29 DIAGNOSIS — K7460 Unspecified cirrhosis of liver: Secondary | ICD-10-CM

## 2011-03-29 DIAGNOSIS — K625 Hemorrhage of anus and rectum: Secondary | ICD-10-CM

## 2011-03-29 LAB — CBC WITH DIFFERENTIAL/PLATELET
Basophils Absolute: 0 10*3/uL (ref 0.0–0.1)
Eosinophils Absolute: 0 10*3/uL (ref 0.0–0.7)
Eosinophils Relative: 0 % (ref 0.0–5.0)
HCT: 42.4 % (ref 39.0–52.0)
Lymphs Abs: 1.1 10*3/uL (ref 0.7–4.0)
MCHC: 33.3 g/dL (ref 30.0–36.0)
MCV: 82.6 fl (ref 78.0–100.0)
Monocytes Absolute: 0.4 10*3/uL (ref 0.1–1.0)
Neutrophils Relative %: 68.3 % (ref 43.0–77.0)
Platelets: 252 10*3/uL (ref 150.0–400.0)
RDW: 15.4 % — ABNORMAL HIGH (ref 11.5–14.6)

## 2011-03-29 LAB — COMPREHENSIVE METABOLIC PANEL
ALT: 20 U/L (ref 0–53)
AST: 20 U/L (ref 0–37)
Albumin: 4.1 g/dL (ref 3.5–5.2)
Alkaline Phosphatase: 59 U/L (ref 39–117)
Chloride: 106 mEq/L (ref 96–112)
Glucose, Bld: 94 mg/dL (ref 70–99)
Total Bilirubin: 1 mg/dL (ref 0.3–1.2)
Total Protein: 7.4 g/dL (ref 6.0–8.3)

## 2011-03-29 LAB — LIPID PANEL
Cholesterol: 208 mg/dL — ABNORMAL HIGH (ref 0–200)
Total CHOL/HDL Ratio: 4
Triglycerides: 26 mg/dL (ref 0.0–149.0)

## 2011-03-29 MED ORDER — CELECOXIB 200 MG PO CAPS: 200.0000 mg | ORAL_CAPSULE | Freq: Every day | ORAL | Status: DC

## 2011-03-29 MED ORDER — LOSARTAN POTASSIUM 50 MG PO TABS: 50.0000 mg | ORAL_TABLET | Freq: Every day | ORAL | Status: DC

## 2011-03-29 NOTE — Assessment & Plan Note (Addendum)
Td 09 flu shot recommended  Reports a remote Cscope , in the 90s, no report, was told normal  we talked about diet and exercise  He has a family history of prostate cancer, grandfather was diagnosed late in life. I rec to check a PSA every other year, he likes it checked  yearly.

## 2011-03-29 NOTE — Assessment & Plan Note (Signed)
Cough reportedly since he started a ACE Change to an ARB Likes to try "OTCs" for BP, ok with me as long as BP wnl

## 2011-03-29 NOTE — Patient Instructions (Signed)
Change to losartan Check the  blood pressure 2 or 3 times a week, be sure it is less than 140/85. If it is consistently higher, let me know Come back in 6 weeks for labs--->  BMP--dx HTN

## 2011-03-29 NOTE — Progress Notes (Signed)
  Subjective:    Patient ID: Gregory Klein, male    DOB: 1965-03-03, 46 y.o.   MRN: [id number]  HPI CPX Complaining of cough, dry since he started to take lisinopril. Wonders if they are related. No recent ambulatory BPs, BP today slightly elevated, diastolic 88  Past Medical History: hemorrhoids Hepatitis B, hx of Dx 2005 aprox Cirrhosis per CT, saw GI 11-2010, Bx was Rx , pt cancelled  Cscope aprox 2003, per pt neg. 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 No fever or chills. Occasionally he has still blood in the stools. Blood is red and associated with bowel movements, like that area "checked" again No nausea, vomiting or diarrhea. His diet has changed, he is eating healthier  GI recommended a liver biopsy, he had to cancel it      Objective:   Physical Exam  Constitutional: He is oriented to person, place, and time. He appears well-developed and well-nourished. No distress.  HENT:  Head: Normocephalic and atraumatic.  Neck: No thyromegaly present.  Cardiovascular: Normal rate, regular rhythm and normal heart sounds.   No murmur heard. Pulmonary/Chest: Effort normal and breath sounds normal. No respiratory distress. He has no wheezes. He has no rales.  Abdominal: Soft. Bowel sounds are normal. He exhibits no distension. There is no tenderness. There is no rebound and no guarding.  Genitourinary: Rectum normal and prostate normal.       No stools found  Musculoskeletal:  He exhibits no edema.  Neurological: He is alert and oriented to person, place, and time.  Skin: He is not diaphoretic.  Psychiatric: He has a normal mood and affect. His behavior is normal. Judgment and thought content normal.       Assessment & Plan:  Multiple questions answered. He's having a mild epigastric discomfort, started Prilosec a few days ago and he is already feeling better. Recommend to take Prilosec as needed. He was also prescribed Celebrex by orthopedic surgery,  Has not taken in a longtime but would like a refill. I provided a refill, warned about GI side effects, recommend to take only one daily.

## 2011-03-29 NOTE — Assessment & Plan Note (Signed)
rec observation, if sx increase will refer to GI

## 2011-03-29 NOTE — Assessment & Plan Note (Signed)
?   Of liver cirrhosis, per GI 10-12: "1. Abnormal imaging of the liver. CT and ultrasound findings show features typically noted in cirrhosis. However there are no physical examination or blood work abnormalities typically noted in cirrhosis. His liver was normal on abdominal ultrasound in 2007. We discussed the risks and benefits of liver biopsy. We also discussed referral to a [facility] for expert hepatologists opinion or observation. After a discussion of these options he prefers to proceed with liver biopsy at this time." Pt canceled Bx

## 2011-03-30 LAB — LDL CHOLESTEROL, DIRECT: Direct LDL: 135.8 mg/dL

## 2011-04-05 ENCOUNTER — Encounter: Payer: Self-pay | Admitting: *Deleted

## 2011-05-04 ENCOUNTER — Other Ambulatory Visit: Payer: Self-pay | Admitting: Internal Medicine

## 2011-05-04 DIAGNOSIS — I10 Essential (primary) hypertension: Secondary | ICD-10-CM

## 2011-05-10 ENCOUNTER — Other Ambulatory Visit (INDEPENDENT_AMBULATORY_CARE_PROVIDER_SITE_OTHER): Payer: BC Managed Care – PPO

## 2011-05-10 DIAGNOSIS — I10 Essential (primary) hypertension: Secondary | ICD-10-CM

## 2011-05-10 DIAGNOSIS — R933 Abnormal findings on diagnostic imaging of other parts of digestive tract: Secondary | ICD-10-CM

## 2011-05-10 LAB — BASIC METABOLIC PANEL
Chloride: 104 mEq/L (ref 96–112)
Creatinine, Ser: 1.2 mg/dL (ref 0.4–1.5)
Sodium: 139 mEq/L (ref 135–145)

## 2011-05-10 LAB — IRON: Iron: 62 ug/dL (ref 42–165)

## 2011-05-17 ENCOUNTER — Encounter: Payer: Self-pay | Admitting: *Deleted

## 2011-08-30 ENCOUNTER — Other Ambulatory Visit: Payer: Self-pay | Admitting: Internal Medicine

## 2011-08-30 NOTE — Telephone Encounter (Signed)
Refill done.  

## 2011-09-25 ENCOUNTER — Other Ambulatory Visit: Payer: Self-pay | Admitting: Internal Medicine

## 2011-09-25 NOTE — Telephone Encounter (Signed)
Refill done.  

## 2011-10-11 ENCOUNTER — Ambulatory Visit (INDEPENDENT_AMBULATORY_CARE_PROVIDER_SITE_OTHER): Payer: BC Managed Care – PPO | Admitting: Internal Medicine

## 2011-10-11 ENCOUNTER — Encounter: Payer: Self-pay | Admitting: Internal Medicine

## 2011-10-11 VITALS — BP 132/88 | HR 85 | Temp 98.4°F | Wt 184.0 lb

## 2011-10-11 DIAGNOSIS — R110 Nausea: Secondary | ICD-10-CM

## 2011-10-11 DIAGNOSIS — Z8619 Personal history of other infectious and parasitic diseases: Secondary | ICD-10-CM

## 2011-10-11 DIAGNOSIS — M5412 Radiculopathy, cervical region: Secondary | ICD-10-CM

## 2011-10-11 DIAGNOSIS — K921 Melena: Secondary | ICD-10-CM

## 2011-10-11 DIAGNOSIS — R195 Other fecal abnormalities: Secondary | ICD-10-CM

## 2011-10-11 MED ORDER — RANITIDINE HCL 150 MG PO TABS: 150.0000 mg | ORAL_TABLET | Freq: Two times a day (BID) | ORAL | Status: DC

## 2011-10-11 NOTE — Patient Instructions (Addendum)
The triggers for gastritis  include stress; the "aspirin family" ; alcohol; peppermint; and caffeine (coffee, tea, cola, and chocolate). The aspirin family would include aspirin and the nonsteroidal agents such as ibuprofen &  Naproxen. Tylenol would not cause reflux. If having symptoms ; food & drink should be avoided for @ least 2 hours before going to bed.  Please complete stool cards   If you activate My Chart; the results can be released to you as soon as they populate from the lab. If you choose not to use this program; the labs have to be reviewed, copied & mailed   causing a delay in getting the results to you.

## 2011-10-11 NOTE — Progress Notes (Signed)
  Subjective:    Patient ID: Gregory Klein, male    DOB: Jan 06, 1966, 46 y.o.   MRN: [id number]  HPI He was experiencing nausea and on 10/08/11 he took Pepto-Bismol x 3 doses. He has not had a bowel movement until today and it was very dark. He now denies nausea or  significant dyspepsia, abdominal pain, unexplained weight loss, rectal bleeding, or dysphagia.  Remotely he did have a gastric ulcer; as did his father.  He has a history of hepatitis B and wanted to check his liver function test.    Review of Systems He has been taking Celebrex at night for neck pain which radiates to the shoulder down the arm. He rarely takes nonsteroidals.  Apparently a cervical radiculopathy was diagnosed on MRI. He saw Dr. Charlett Blake  who recommended Celebrex. He did receive epidural steroid injection cervically      Objective:   Physical Exam General appearance is one of good health and nourishment w/o distress.  Eyes: No conjunctival inflammation or scleral icterus is present.  Neck: Full range of motion  Oral exam: Dental hygiene is good; lips and gums are healthy appearing.There is no oropharyngeal erythema or exudate noted.   Heart:  Normal rate and regular rhythm. S1 and S2 normal without gallop,  click, rub or other extra sounds.Grade 1/2- 1 over 6 systolic murmur      Lungs:Chest clear to auscultation; no wheezes, rhonchi,rales ,or rubs present.No increased work of breathing.   Abdomen: bowel sounds normal, soft and non-tender without masses, organomegaly or hernias noted.  No guarding or rebound   Skin:Warm & dry.  Intact without suspicious lesions or rashes ; no jaundice or tenting  Lymphatic: No lymphadenopathy is noted about the head, neck, axilla, or inguinal areas.   Rectal exam: Scant stool which is dark Hemoccult-negative  Neuro: Deep tendon reflexes are normal and equal. Strength and tone in extremities is normal as well.            Assessment & Plan:  #1 dark stool, probably  from Pepto-Bismol  #2 nausea resolved  #3 cervical radiculopathy for which he takes Celebrex.  #4 history of gastric ulcer; there could be some risk long-term using Celebrex on a regular basis as he is doing  #5 history of hepatitis B  Plan: Ranitidine would be recommended for the nausea. Second opinion with neurosurgeon is appropriate as long-term Celebrex is not optimal for reasons listed above. See other orders and recommendations.

## 2011-10-12 LAB — CBC WITH DIFFERENTIAL/PLATELET
Basophils Relative: 0.2 % (ref 0.0–3.0)
Eosinophils Relative: 0.1 % (ref 0.0–5.0)
Hemoglobin: 14 g/dL (ref 13.0–17.0)
Lymphocytes Relative: 14.9 % (ref 12.0–46.0)
MCV: 84.4 fl (ref 78.0–100.0)
Monocytes Absolute: 0.4 10*3/uL (ref 0.1–1.0)
Neutro Abs: 4.8 10*3/uL (ref 1.4–7.7)
Neutrophils Relative %: 77.8 % — ABNORMAL HIGH (ref 43.0–77.0)
RBC: 5.21 Mil/uL (ref 4.22–5.81)
WBC: 6.2 10*3/uL (ref 4.5–10.5)

## 2011-10-12 LAB — ALT: ALT: 19 U/L (ref 0–53)

## 2011-11-23 ENCOUNTER — Other Ambulatory Visit: Payer: Self-pay | Admitting: Orthopedic Surgery

## 2011-11-23 DIAGNOSIS — M542 Cervicalgia: Secondary | ICD-10-CM

## 2011-11-23 DIAGNOSIS — M79603 Pain in arm, unspecified: Secondary | ICD-10-CM

## 2011-12-01 ENCOUNTER — Ambulatory Visit
Admission: RE | Admit: 2011-12-01 | Discharge: 2011-12-01 | Disposition: A | Discharge status: Home / Self Care | Payer: BC Managed Care – PPO | Source: Ambulatory Visit | Attending: Orthopedic Surgery | Admitting: Orthopedic Surgery

## 2011-12-01 ENCOUNTER — Other Ambulatory Visit: Payer: Self-pay | Admitting: Orthopedic Surgery

## 2011-12-01 ENCOUNTER — Other Ambulatory Visit: Payer: BC Managed Care – PPO

## 2011-12-01 DIAGNOSIS — M542 Cervicalgia: Secondary | ICD-10-CM

## 2011-12-01 DIAGNOSIS — M79601 Pain in right arm: Secondary | ICD-10-CM

## 2011-12-01 DIAGNOSIS — M79603 Pain in arm, unspecified: Secondary | ICD-10-CM

## 2011-12-01 MED ORDER — IOHEXOL 300 MG/ML  SOLN
1.0000 mL | Freq: Once | INTRAMUSCULAR | Status: AC | PRN
  Administered 2011-12-01: 1 mL via EPIDURAL

## 2011-12-01 MED ORDER — TRIAMCINOLONE ACETONIDE 40 MG/ML IJ SUSP (RADIOLOGY)
60.0000 mg | Freq: Once | INTRAMUSCULAR | Status: AC
  Administered 2011-12-01: 60 mg via EPIDURAL

## 2011-12-26 ENCOUNTER — Other Ambulatory Visit: Payer: BC Managed Care – PPO

## 2012-03-22 ENCOUNTER — Other Ambulatory Visit: Payer: Self-pay | Admitting: Internal Medicine

## 2012-03-22 ENCOUNTER — Encounter: Payer: Self-pay | Admitting: *Deleted

## 2012-03-22 NOTE — Telephone Encounter (Signed)
Refill done.  Letter mailed to pt to notify him he is due for a CPE.

## 2012-04-17 ENCOUNTER — Telehealth: Payer: Self-pay | Admitting: Internal Medicine

## 2012-04-17 NOTE — Telephone Encounter (Signed)
lmovm for pt to return call.  

## 2012-04-17 NOTE — Telephone Encounter (Signed)
Pt came in and wants refill for med but his cpe is not until late April. I told him that I was not sure if Gregory Klein would refill without seeing him for a med chk since cpe was far away. He needs you to call him or call me and I will call him to set up med chk appt. thanks

## 2012-04-22 MED ORDER — LOSARTAN POTASSIUM 50 MG PO TABS: ORAL_TABLET | ORAL | Status: DC

## 2012-04-22 NOTE — Telephone Encounter (Signed)
Discussed with pt, scheduled pt for CPE 3.3.14.  Refilled losartan.

## 2012-04-29 ENCOUNTER — Ambulatory Visit (INDEPENDENT_AMBULATORY_CARE_PROVIDER_SITE_OTHER): Payer: BC Managed Care – PPO | Admitting: Internal Medicine

## 2012-04-29 VITALS — BP 112/78 | HR 86 | Temp 98.1°F | Wt 178.0 lb

## 2012-04-29 DIAGNOSIS — Z0000 Encounter for general adult medical examination without abnormal findings: Secondary | ICD-10-CM

## 2012-04-29 LAB — LIPID PANEL
Cholesterol: 171 mg/dL (ref 0–200)
HDL: 54.7 mg/dL (ref >39.00)
Triglycerides: 34 mg/dL (ref 0.0–149.0)
VLDL: 6.8 mg/dL (ref 0.0–40.0)

## 2012-04-29 LAB — COMPREHENSIVE METABOLIC PANEL
ALT: 19 U/L (ref 0–53)
Albumin: 3.7 g/dL (ref 3.5–5.2)
CO2: 31 mEq/L (ref 19–32)
Calcium: 8.8 mg/dL (ref 8.4–10.5)
Chloride: 102 mEq/L (ref 96–112)
GFR: 81.03 mL/min (ref >60.00)
Glucose, Bld: 92 mg/dL (ref 70–99)
Sodium: 140 mEq/L (ref 135–145)
Total Protein: 7.2 g/dL (ref 6.0–8.3)

## 2012-04-29 LAB — CBC WITH DIFFERENTIAL/PLATELET
Basophils Absolute: 0 10*3/uL (ref 0.0–0.1)
Lymphocytes Relative: 20.4 % (ref 12.0–46.0)
Lymphs Abs: 0.9 10*3/uL (ref 0.7–4.0)
Monocytes Relative: 9.7 % (ref 3.0–12.0)
Neutrophils Relative %: 69.1 % (ref 43.0–77.0)
Platelets: 229 10*3/uL (ref 150.0–400.0)
RDW: 14.9 % — ABNORMAL HIGH (ref 11.5–14.6)

## 2012-04-29 MED ORDER — RANITIDINE HCL 300 MG PO CAPS: 300.0000 mg | ORAL_CAPSULE | Freq: Every evening | ORAL | Status: DC

## 2012-04-29 MED ORDER — LOSARTAN POTASSIUM 50 MG PO TABS: ORAL_TABLET | ORAL | Status: DC

## 2012-04-29 MED ORDER — VALACYCLOVIR HCL 1 G PO TABS: ORAL_TABLET | ORAL | Status: DC

## 2012-04-29 NOTE — Assessment & Plan Note (Addendum)
No change , RF. Wonders if he could stop BP meds, I encouraged him to take his BP from time to time. if is noted to be in the low side, he could decrease or d/c meds

## 2012-04-29 NOTE — Assessment & Plan Note (Signed)
Question of cirrhosis, see previous entry, update on hep A shots.

## 2012-04-29 NOTE — Assessment & Plan Note (Addendum)
Td 09 Reports a remote Cscope , in the 90s, no report, was told normal.  DRE 2013 (-) , consistently normal PSAs. Pt is a minority, will proceed w/ prostate ca screening every 2 years   diet improved, exercise limited by lack of time

## 2012-04-29 NOTE — Progress Notes (Signed)
  Subjective:    Patient ID: Gregory Klein, male    DOB: July 31, 1965, 47 y.o.   MRN: [id number]  HPI Complete physical exam  Past Medical History  Diagnosis Date  . 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 Neck pain     local injections x 2, las summer 2011 (Dr.Vortek)  . GERD (gastroesophageal reflux disease)   . Hypertension   . Cirrhosis     per CT, saw GI 11-2010, Bx was Rx , pt cancelled    . Hemorrhoids    Past Surgical History  Procedure Laterality Date  . Inguinal hernia repair     Family History: CAD - GM HTN - M, GM stroke - M (tia) DM - no colon Ca -no prostate Ca - MGF (age 10)   History   Social History  . Marital Status: Single    Spouse Name: N/A    Number of Children: 0  . Years of Education: N/A   Occupational History  . Child psychotherapist, works at a high school    . back to school, masters school administration    Social History Main Topics  . Smoking status: Never Smoker   . Smokeless tobacco: Never Used  . Alcohol Use: No  . Drug Use: No  . Sexually Active: Not on file   Other Topics Concern  . Not on file   Social History Narrative   Exercise -- active at work, no actual exercise    diet-- improved, less eating out     Review of Systems Denies chest pain or shortness or breath No nausea, vomiting, diarrhea. No blood in the stools. He is very busy however denies anxiety or depression. No dysuria or gross hematuria. Wonders if he could stop BP meds.    Objective:   Physical Exam BP 112/78  Pulse 86  Temp(Src) 98.1 F (36.7 C) (Oral)  Wt 178 lb (80.74 kg)  BMI 25.17 kg/m2  SpO2 98% General -- alert, well-developed .   Neck --no thyromegaly , normal carotid pulse Lungs -- normal respiratory effort, no intercostal retractions, no accessory muscle use, and normal breath sounds.   Heart-- normal rate, regular rhythm, no murmur, and no gallop.   Abdomen--soft, non-tender, no distention, no masses, no HSM, no guarding, and no  rigidity.   Extremities-- no pretibial edema bilaterally Neurologic-- alert & oriented X3 and strength normal in all extremities. Psych-- Cognition and judgment appear intact. Alert and cooperative with normal attention span and concentration.  not anxious appearing and not depressed appearing.      Assessment & Plan:

## 2012-04-30 ENCOUNTER — Encounter: Payer: Self-pay | Admitting: Internal Medicine

## 2012-06-19 ENCOUNTER — Telehealth: Payer: Self-pay | Admitting: *Deleted

## 2012-06-19 NOTE — Telephone Encounter (Signed)
Prior Auth approved 05-29-12 until 06-19-13, pharmacy faxed. Awaiting approval letter. Case ID [id number]

## 2012-06-25 ENCOUNTER — Ambulatory Visit: Payer: BC Managed Care – PPO | Admitting: Internal Medicine

## 2012-10-08 ENCOUNTER — Other Ambulatory Visit: Payer: Self-pay | Admitting: Orthopedic Surgery

## 2012-10-08 ENCOUNTER — Other Ambulatory Visit: Payer: BC Managed Care – PPO

## 2012-10-08 DIAGNOSIS — R609 Edema, unspecified: Secondary | ICD-10-CM

## 2012-10-08 DIAGNOSIS — M5410 Radiculopathy, site unspecified: Secondary | ICD-10-CM

## 2012-10-08 DIAGNOSIS — M542 Cervicalgia: Secondary | ICD-10-CM

## 2012-10-14 ENCOUNTER — Ambulatory Visit
Admission: RE | Admit: 2012-10-14 | Discharge: 2012-10-14 | Disposition: A | Discharge status: Home / Self Care | Payer: BC Managed Care – PPO | Source: Ambulatory Visit | Attending: Orthopedic Surgery | Admitting: Orthopedic Surgery

## 2012-10-14 DIAGNOSIS — R609 Edema, unspecified: Secondary | ICD-10-CM

## 2012-10-14 DIAGNOSIS — M5410 Radiculopathy, site unspecified: Secondary | ICD-10-CM

## 2012-10-14 DIAGNOSIS — M542 Cervicalgia: Secondary | ICD-10-CM

## 2012-10-15 ENCOUNTER — Other Ambulatory Visit: Payer: Self-pay | Admitting: Orthopedic Surgery

## 2012-10-15 DIAGNOSIS — M5020 Other cervical disc displacement, unspecified cervical region: Secondary | ICD-10-CM

## 2012-10-18 ENCOUNTER — Ambulatory Visit
Admission: RE | Admit: 2012-10-18 | Discharge: 2012-10-18 | Disposition: A | Discharge status: Home / Self Care | Payer: BC Managed Care – PPO | Source: Ambulatory Visit | Attending: Orthopedic Surgery | Admitting: Orthopedic Surgery

## 2012-10-18 VITALS — BP 129/88 | HR 68

## 2012-10-18 DIAGNOSIS — M5020 Other cervical disc displacement, unspecified cervical region: Secondary | ICD-10-CM

## 2012-10-18 MED ORDER — IOHEXOL 300 MG/ML  SOLN
1.0000 mL | Freq: Once | INTRAMUSCULAR | Status: AC | PRN
  Administered 2012-10-18: 1 mL via EPIDURAL

## 2012-10-18 MED ORDER — TRIAMCINOLONE ACETONIDE 40 MG/ML IJ SUSP (RADIOLOGY)
60.0000 mg | Freq: Once | INTRAMUSCULAR | Status: AC
  Administered 2012-10-18: 60 mg via EPIDURAL

## 2012-10-18 NOTE — Progress Notes (Signed)
Pt states on one occasion her became lightheaded during the injection. Pt was given some coke to drink.   explained that because he had done it before, didn't mean it would happen again.

## 2012-10-31 ENCOUNTER — Other Ambulatory Visit: Payer: BC Managed Care – PPO

## 2013-01-02 ENCOUNTER — Other Ambulatory Visit: Payer: Self-pay

## 2013-02-04 ENCOUNTER — Ambulatory Visit (INDEPENDENT_AMBULATORY_CARE_PROVIDER_SITE_OTHER): Payer: BC Managed Care – PPO | Admitting: Internal Medicine

## 2013-02-04 ENCOUNTER — Encounter: Payer: Self-pay | Admitting: Internal Medicine

## 2013-02-04 VITALS — BP 132/82 | HR 90 | Temp 98.2°F | Wt 194.0 lb

## 2013-02-04 DIAGNOSIS — I10 Essential (primary) hypertension: Secondary | ICD-10-CM

## 2013-02-04 DIAGNOSIS — N4889 Other specified disorders of penis: Secondary | ICD-10-CM

## 2013-02-04 DIAGNOSIS — N485 Ulcer of penis: Secondary | ICD-10-CM | POA: Insufficient documentation

## 2013-02-04 MED ORDER — LOSARTAN POTASSIUM 50 MG PO TABS: ORAL_TABLET | ORAL | Status: DC

## 2013-02-04 NOTE — Progress Notes (Signed)
   Subjective:    Patient ID: Gregory Klein, male    DOB: April 06, 1965, 47 y.o.   MRN: [id number]  HPI Acute visit 2 weeks ago developed 2 bumps on the glans of the penis, later on they becomes sores. Has not noticed any other problems in that area specifically no penile discharge, rash. The lesions were not itchy  or painful.  Past Medical History  Diagnosis Date  . 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 Neck pain     local injections x 2, las summer 2011 (Dr.Vortek)  . GERD (gastroesophageal reflux disease)   . Hypertension   . Cirrhosis     per CT, saw GI 11-2010, Bx was Rx , pt cancelled    . Hemorrhoids    Past Surgical History  Procedure Laterality Date  . Inguinal hernia repair       Review of Systems Admits to unprotected sex---> received oral sex, from a  male, no other interactions. Did not use a condom    Objective:   Physical Exam  Constitutional: He appears well-developed and well-nourished. No distress.  Genitourinary:     Skin: He is not diaphoretic.  Psychiatric: He has a normal mood and affect. His behavior is normal. Judgment and thought content normal.   BP 132/82  Pulse 90  Temp(Src) 98.2 F (36.8 C)  Wt 194 lb (87.998 kg)  SpO2 100%      Assessment & Plan:

## 2013-02-04 NOTE — Assessment & Plan Note (Addendum)
Single male , sexually  active, present w/ a painless penile lesion x 2 and a LAD. DDX Is large but includes syphilis, herpes, and less common  STDs. Plan: G&C, HIV, VDRL , Herpes serology (has a history of cold sores, take Valtrex as needed) Refer to derm Dr Joseph Art for further eval Call results to----------------- [phone] Multiple questions answer to the best of my ability , time spent counseling about 15 minutes in this 25 minutes visit

## 2013-02-04 NOTE — Patient Instructions (Addendum)
Get your blood  Work and urine test  before you leave  Next visit for a physical exam   fasting, in 4 months  Please make an appointment

## 2013-02-04 NOTE — Assessment & Plan Note (Signed)
RF meds , out of meds x few days

## 2013-02-04 NOTE — Progress Notes (Signed)
Pre visit review using our clinic review tool, if applicable. No additional management support is needed unless otherwise documented below in the visit note. 

## 2013-02-05 LAB — HSV(HERPES SIMPLEX VRS) I + II AB-IGG: HSV 2 Glycoprotein G Ab, IgG: <0.1 IV

## 2013-02-05 LAB — GC/CHLAMYDIA PROBE AMP, URINE: Chlamydia, Swab/Urine, PCR: NEGATIVE

## 2013-02-05 LAB — RPR

## 2013-02-06 ENCOUNTER — Telehealth: Payer: Self-pay | Admitting: Internal Medicine

## 2013-02-06 NOTE — Telephone Encounter (Signed)
Discussed with patient and he voiced understanding. He has agreed to follow up with Dermatology and is willing to see anyone, I made [place] aware.     KP

## 2013-02-06 NOTE — Telephone Encounter (Signed)
Notes Recorded by [name], MD on 02/06/2013 at 11:42 AM Call patient: G&C, HIV, RPR negative. Tests indicate that he has been exposed previously to herpes, still has some labs pending, plan is the same

## 2013-02-06 NOTE — Telephone Encounter (Signed)
Pt is calling to request results of recent labs. Please advise.

## 2013-02-27 DIAGNOSIS — I409 Acute myocarditis, unspecified: Secondary | ICD-10-CM

## 2013-02-27 HISTORY — DX: Acute myocarditis, unspecified: I40.9

## 2013-03-01 ENCOUNTER — Encounter (HOSPITAL_COMMUNITY): Payer: Self-pay | Admitting: Emergency Medicine

## 2013-03-01 ENCOUNTER — Emergency Department (HOSPITAL_COMMUNITY)
Admission: EM | Admit: 2013-03-01 | Discharge: 2013-03-01 | Disposition: A | Discharge status: Home / Self Care | Payer: BC Managed Care – PPO | Source: Home / Self Care

## 2013-03-01 DIAGNOSIS — N489 Disorder of penis, unspecified: Secondary | ICD-10-CM

## 2013-03-01 DIAGNOSIS — R599 Enlarged lymph nodes, unspecified: Secondary | ICD-10-CM

## 2013-03-01 DIAGNOSIS — N4889 Other specified disorders of penis: Secondary | ICD-10-CM

## 2013-03-01 DIAGNOSIS — R590 Localized enlarged lymph nodes: Secondary | ICD-10-CM

## 2013-03-01 MED ORDER — DOXYCYCLINE HYCLATE 100 MG PO CAPS: 100.0000 mg | ORAL_CAPSULE | Freq: Two times a day (BID) | ORAL | Status: DC

## 2013-03-01 NOTE — ED Notes (Signed)
Pt was seen by Dr [name] about a month ago and had a STD eval and was told to results were all negative.  Today he c/o bilateral inguinal swelling and a lesion on his penis.  He has an appt with a dermatologist 1 /12.

## 2013-03-01 NOTE — ED Provider Notes (Signed)
CSN: [id number]     Arrival date & time 03/01/13  2774 History   First MD Initiated Contact with Patient 03/01/13 [phone]     Chief Complaint  Patient presents with  . Groin Pain   (Consider location/radiation/quality/duration/timing/severity/associated sxs/prior Treatment) HPI Comments: 48 year old male concerned about lesions on his penis and enlarged tender lymph nodes in the inguinal folds. He saw Dr. [name] recently and was tested for GC, Chlamydia, RPR, HIV in HSV. 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 In the past 2 weeks he developed 2 small papules to the distal shaft of the penis that has coalesced into a circular crusty lesion. He also has itching and roughness to the skin at the ventral area of the distal penis. None of these are draining there are no vesicles. In these are painless. He states he has no dysuria however he believes the shape of the meatus is changing in that he has difficulty in obtaining his urine.   Past Medical History  Diagnosis Date  . 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 Neck pain     local injections x 2, las summer 2011 (Dr.Vortek)  . GERD (gastroesophageal reflux disease)   . Hypertension   . Cirrhosis     per CT, saw GI 11-2010, Bx was Rx , pt cancelled    . Hemorrhoids    Past Surgical History  Procedure Laterality Date  . Inguinal hernia repair     Family History  Problem Relation Age of Onset  . Coronary artery disease Maternal Grandmother   . Hypertension Mother   . Stroke Mother   . Diabetes Neg Hx   . Colon cancer Neg Hx   . Prostate cancer Maternal Grandfather 41  . Hypertension Father   . Hypertension Maternal Grandmother   . Breast cancer Paternal Grandmother   . Pancreatic cancer Paternal Aunt     GREAT   History  Substance Use Topics  . Smoking status: Never Smoker   . Smokeless tobacco: Never Used  . Alcohol Use: No    Review of Systems  Constitutional: Negative.    Genitourinary:       As per history of present illness  All other systems reviewed and are negative.    Allergies  Amoxicillin  Home Medications   Current Outpatient Rx  Name  Route  Sig  Dispense  Refill  . losartan (COZAAR) 50 MG tablet      take 1 tablet by mouth once daily   90 tablet   2   . polyethylene glycol powder (MIRALAX) powder   Oral   Take 17 g by mouth 2 (two) times a week.           . psyllium (METAMUCIL) 58.6 % powder   Oral   Take 1 packet by mouth daily.           Marland Kitchen doxycycline (VIBRAMYCIN) 100 MG capsule   Oral   Take 1 capsule (100 mg total) by mouth 2 (two) times daily.   20 capsule   0   . ranitidine (ZANTAC) 300 MG capsule   Oral   Take 1 capsule (300 mg total) by mouth every evening.   90 capsule   2   . valACYclovir (VALTREX) 1000 MG tablet      take 2 tablets by mouth every morning and 2 every evening FOR EACH OUTBREAK.   30 tablet  1    BP 136/85  Pulse 87  Temp(Src) 98.7 F (37.1 C) (Oral)  Resp 16  SpO2 100% Physical Exam  Nursing note and vitals reviewed. Constitutional: He is oriented to person, place, and time. He appears well-developed and well-nourished. No distress.  Pulmonary/Chest: Effort normal. No respiratory distress.  Genitourinary:  There is a 1 cm annular hardened crusty lesion to the dorsal surface of the distal shaft of the penis. It is nontender and there is no draining or erythema. There are tiny crusty lesions that can be palpated but difficult to observe on the ventral surface just proximal to the glands. No penile discharge.  Musculoskeletal: He exhibits no edema and no tenderness.  Lymphadenopathy:       Right: Inguinal adenopathy present.       Left: Inguinal adenopathy present.  Mildly tender enlarged elongated bilateral inguinal lymphadenopathy.  Neurological: He is alert and oriented to person, place, and time. He exhibits normal muscle tone.  Skin: Skin is warm and dry.  There are no  distal lesions involving lower extremities to suggest saline as or other skin infection.    ED Course  Procedures (including critical care time) Labs Review Labs Reviewed - No data to display Imaging Review No results found.    MDM   1. Penile lesion   2. Lymphadenopathy, inguinal      Keep appointment with dermatologist only 12 Interim doxycycline 100 mg twice a day for 10 days  Janne Napoleon, NP 03/01/13 1104

## 2013-03-01 NOTE — Discharge Instructions (Signed)
Lymphadenopathy °Lymphadenopathy means "disease of the lymph glands." But the term is usually used to describe swollen or enlarged lymph glands, also called lymph nodes. These are the bean-shaped organs found in many locations including the neck, underarm, and groin. Lymph glands are part of the immune system, which fights infections in your body. Lymphadenopathy can occur in just one area of the body, such as the neck, or it can be generalized, with lymph node enlargement in several areas. The nodes found in the neck are the most common sites of lymphadenopathy. °CAUSES  °When your immune system responds to germs (such as viruses or bacteria ), infection-fighting cells and fluid build up. This causes the glands to grow in size. This is usually not something to worry about. Sometimes, the glands themselves can become infected and inflamed. This is called lymphadenitis. °Enlarged lymph nodes can be caused by many diseases: °· Bacterial disease, such as strep throat or a skin infection. °· Viral disease, such as a common cold. °· Other germs, such as lyme disease, tuberculosis, or sexually transmitted diseases. °· Cancers, such as lymphoma (cancer of the lymphatic system) or leukemia (cancer of the white blood cells). °· Inflammatory diseases such as lupus or rheumatoid arthritis. °· Reactions to medications. °Many of the diseases above are rare, but important. This is why you should see your caregiver if you have lymphadenopathy. °SYMPTOMS  °· Swollen, enlarged lumps in the neck, back of the head or other locations. °· Tenderness. °· Warmth or redness of the skin over the lymph nodes. °· Fever. °DIAGNOSIS  °Enlarged lymph nodes are often near the source of infection. They can help healthcare providers diagnose your illness. For instance:  °· Swollen lymph nodes around the jaw might be caused by an infection in the mouth. °· Enlarged glands in the neck often signal a throat infection. °· Lymph nodes that are swollen  in more than one area often indicate an illness caused by a virus. °Your caregiver most likely will know what is causing your lymphadenopathy after listening to your history and examining you. Blood tests, x-rays or other tests may be needed. If the cause of the enlarged lymph node cannot be found, and it does not go away by itself, then a biopsy may be needed. Your caregiver will discuss this with you. °TREATMENT  °Treatment for your enlarged lymph nodes will depend on the cause. Many times the nodes will shrink to normal size by themselves, with no treatment. Antibiotics or other medicines may be needed for infection. Only take over-the-counter or prescription medicines for pain, discomfort or fever as directed by your caregiver. °HOME CARE INSTRUCTIONS  °Swollen lymph glands usually return to normal when the underlying medical condition goes away. If they persist, contact your health-care provider. He/she might prescribe antibiotics or other treatments, depending on the diagnosis. Take any medications exactly as prescribed. Keep any follow-up appointments made to check on the condition of your enlarged nodes.  °SEEK MEDICAL CARE IF:  °· Swelling lasts for more than two weeks. °· You have symptoms such as weight loss, night sweats, fatigue or fever that does not go away. °· The lymph nodes are hard, seem fixed to the skin or are growing rapidly. °· Skin over the lymph nodes is red and inflamed. This could mean there is an infection. °SEEK IMMEDIATE MEDICAL CARE IF:  °· Fluid starts leaking from the area of the enlarged lymph node. °· You develop a fever of 102° F (38.9° C) or greater. °· Severe   pain develops (not necessarily at the site of a large lymph node).  You develop chest pain or shortness of breath.  You develop worsening abdominal pain. MAKE SURE YOU:   Understand these instructions.  Will watch your condition.  Will get help right away if you are not doing well or get worse. Document  Released: 11/23/2007 Document Revised: 05/08/2011 Document Reviewed: 11/23/2007 [facility] Patient Information 2014 [place].  Sexually Transmitted Disease Sexually transmitted disease (STD) refers to any infection that is passed from person to person during sexual activity. This may happen by way of saliva, semen, blood, vaginal mucus, or urine. Common STDs include:  Gonorrhea.  Chlamydia.  Syphilis.  HIV/AIDS.  Genital herpes.  Hepatitis B and C.  Trichomonas.  Human papillomavirus (HPV).  Pubic lice. CAUSES  An STD may be spread by bacteria, virus, or parasite. A person can get an STD by:  Sexual intercourse with an infected person.  Sharing sex toys with an infected person.  Sharing needles with an infected person.  Having intimate contact with the genitals, mouth, or rectal areas of an infected person. SYMPTOMS  Some people may not have any symptoms, but they can still pass the infection to others. Different STDs have different symptoms. Symptoms include:  Painful or bloody urination.  Pain in the pelvis, abdomen, vagina, anus, throat, or eyes.  Skin rash, itching, irritation, growths, or sores (lesions). These usually occur in the genital or anal area.  Abnormal vaginal discharge.  Penile discharge in men.  Soft, flesh-colored skin growths in the genital or anal area.  Fever.  Pain or bleeding during sexual intercourse.  Swollen glands in the groin area.  Yellow skin and eyes (jaundice). This is seen with hepatitis. DIAGNOSIS  To make a diagnosis, your caregiver may:  Take a medical history.  Perform a physical exam.  Take a specimen (culture) to be examined.  Examine a sample of discharge under a microscope.  Perform blood tests.  Perform a Pap test, if this applies.  Perform a colposcopy.  Perform a laparoscopy. TREATMENT   Chlamydia, gonorrhea, trichomonas, and syphilis can be cured with antibiotic medicine.  Genital herpes,  hepatitis, and HIV can be treated, but not cured, with prescribed medicines. The medicines will lessen the symptoms.  Genital warts from HPV can be treated with medicine or by freezing, burning (electrocautery), or surgery. Warts may come back.  HPV is a virus and cannot be cured with medicine or surgery.However, abnormal areas may be followed very closely by your caregiver and may be removed from the cervix, vagina, or vulva through office procedures or surgery. If your diagnosis is confirmed, your recent sexual partners need treatment. This is true even if they are symptom-free or have a negative culture or evaluation. They should not have sex until their caregiver says it is okay. HOME CARE INSTRUCTIONS  All sexual partners should be informed, tested, and treated for all STDs.  Take your antibiotics as directed. Finish them even if you start to feel better.  Only take over-the-counter or prescription medicines for pain, discomfort, or fever as directed by your caregiver.  Rest.  Eat a balanced diet and drink enough fluids to keep your urine clear or pale yellow.  Do not have sex until treatment is completed and you have followed up with your caregiver. STDs should be checked after treatment.  Keep all follow-up appointments, Pap tests, and blood tests as directed by your caregiver.  Only use latex condoms and water-soluble lubricants during sexual  activity. Do not use petroleum jelly or oils.  Avoid alcohol and illegal drugs.  Get vaccinated for HPV and hepatitis. If you have not received these vaccines in the past, talk to your caregiver about whether one or both might be right for you.  Avoid risky sex practices that can break the skin. The only way to avoid getting an STD is to avoid all sexual activity.Latex condoms and dental dams (for oral sex) will help lessen the risk of getting an STD, but will not completely eliminate the risk. SEEK MEDICAL CARE IF:   You have a  fever.  You have any new or worsening symptoms. Document Released: 05/06/2002 Document Revised: 05/08/2011 Document Reviewed: 09/03/2012 [facility] Patient Information 2014 [place], [place].

## 2013-03-04 NOTE — ED Provider Notes (Signed)
Medical screening examination/treatment/procedure(s) were performed by resident physician or non-physician practitioner and as supervising physician I was immediately available for consultation/collaboration.   KINDL,JAMES DOUGLAS MD.   James D Kindl, MD 03/04/13 0846 

## 2013-04-29 ENCOUNTER — Telehealth: Payer: Self-pay

## 2013-04-29 NOTE — Telephone Encounter (Addendum)
Mailbox full.  Call x 2  Tdap--07/2007 PSA--02/2011--0.64

## 2013-04-30 ENCOUNTER — Ambulatory Visit (INDEPENDENT_AMBULATORY_CARE_PROVIDER_SITE_OTHER): Payer: BC Managed Care – PPO | Admitting: Internal Medicine

## 2013-04-30 ENCOUNTER — Encounter: Payer: Self-pay | Admitting: Internal Medicine

## 2013-04-30 ENCOUNTER — Telehealth: Payer: Self-pay | Admitting: Internal Medicine

## 2013-04-30 VITALS — BP 130/83 | HR 84 | Temp 97.9°F | Ht 70.5 in | Wt 193.0 lb

## 2013-04-30 DIAGNOSIS — Z0000 Encounter for general adult medical examination without abnormal findings: Secondary | ICD-10-CM

## 2013-04-30 DIAGNOSIS — I10 Essential (primary) hypertension: Secondary | ICD-10-CM

## 2013-04-30 LAB — CBC WITH DIFFERENTIAL/PLATELET
BASOS PCT: 0.4 % (ref 0.0–3.0)
Basophils Absolute: 0 10*3/uL (ref 0.0–0.1)
EOS PCT: 0.3 % (ref 0.0–5.0)
Eosinophils Absolute: 0 10*3/uL (ref 0.0–0.7)
HEMATOCRIT: 41.8 % (ref 39.0–52.0)
Hemoglobin: 13.6 g/dL (ref 13.0–17.0)
LYMPHS ABS: 0.9 10*3/uL (ref 0.7–4.0)
Lymphocytes Relative: 22.8 % (ref 12.0–46.0)
MCHC: 32.4 g/dL (ref 30.0–36.0)
MCV: 83.6 fl (ref 78.0–100.0)
MONO ABS: 0.4 10*3/uL (ref 0.1–1.0)
Monocytes Relative: 8.7 % (ref 3.0–12.0)
NEUTROS ABS: 2.8 10*3/uL (ref 1.4–7.7)
Neutrophils Relative %: 67.8 % (ref 43.0–77.0)
Platelets: 230 10*3/uL (ref 150.0–400.0)
RBC: 5.01 Mil/uL (ref 4.22–5.81)
RDW: 15.1 % — ABNORMAL HIGH (ref 11.5–14.6)
WBC: 4.2 10*3/uL — AB (ref 4.5–10.5)

## 2013-04-30 LAB — COMPREHENSIVE METABOLIC PANEL
ALK PHOS: 49 U/L (ref 39–117)
ALT: 21 U/L (ref 0–53)
AST: 18 U/L (ref 0–37)
Albumin: 3.7 g/dL (ref 3.5–5.2)
BILIRUBIN TOTAL: 1 mg/dL (ref 0.3–1.2)
BUN: 15 mg/dL (ref 6–23)
CO2: 28 mEq/L (ref 19–32)
Calcium: 8.8 mg/dL (ref 8.4–10.5)
Chloride: 102 mEq/L (ref 96–112)
Creatinine, Ser: 1.2 mg/dL (ref 0.4–1.5)
GFR: 88 mL/min (ref >60.00)
GLUCOSE: 96 mg/dL (ref 70–99)
Potassium: 4.1 mEq/L (ref 3.5–5.1)
SODIUM: 137 meq/L (ref 135–145)
TOTAL PROTEIN: 6.6 g/dL (ref 6.0–8.3)

## 2013-04-30 LAB — LIPID PANEL
CHOL/HDL RATIO: 3
Cholesterol: 182 mg/dL (ref 0–200)
HDL: 54.9 mg/dL (ref >39.00)
LDL CALC: 122 mg/dL — AB (ref 0–99)
Triglycerides: 26 mg/dL (ref 0.0–149.0)
VLDL: 5.2 mg/dL (ref 0.0–40.0)

## 2013-04-30 LAB — PSA: PSA: 0.66 ng/mL (ref 0.10–4.00)

## 2013-04-30 LAB — TSH: TSH: 1.19 u[IU]/mL (ref 0.35–5.50)

## 2013-04-30 NOTE — Assessment & Plan Note (Addendum)
Td 09 Reports a remote Cscope , in the 90s, no report, was told normal.  Reports occasional red blood per rectum, description points to a benign etiology. Unable to do a good rectal exam Or anoscopy. We'll recommend observation, reach fiber diet, MiraLax, hemocult If he has anemia or symptoms increase he will need a workup. Patient aware. Check a  PSA   diet improved, exercise-- discussed

## 2013-04-30 NOTE — Assessment & Plan Note (Signed)
Well-controlled noting he is taking Celebrex as prescribed by orthopedic surgery for knee pain

## 2013-04-30 NOTE — Telephone Encounter (Signed)
Relevant patient education assigned to patient using Emmi. ° °

## 2013-04-30 NOTE — Telephone Encounter (Signed)
Unable to reach prior to visit  

## 2013-04-30 NOTE — Patient Instructions (Signed)
Get your blood work before you leave   If the anal bleeding increases let me know  Metamucil 2 capsules daily MiraLax 17 g daily as needed  Next visit is for routine check up regards your  blood pressure, bleeding  in 6 months  No need to come back fasting Please make an appointment

## 2013-04-30 NOTE — Progress Notes (Signed)
Pre visit review using our clinic review tool, if applicable. No additional management support is needed unless otherwise documented below in the visit note. 

## 2013-04-30 NOTE — Progress Notes (Signed)
Subjective:    Patient ID: Gregory Klein, male    DOB: [birth year], 48 y.o.   MRN: [id number]  DOS:  04/30/2013 Type of  visit: CPX Having left knee pain for 2-3 weeks, sees Dr. Lynann Bologna    ROS Diet, Exercise-- "not doing well " d/t schedule mostly  No  CP, SOB No palpitations, no lower extremity edema Denies  nausea, vomiting diarrhea Denies  blood in the stools occ BM w/ blood , blood in stools, only w/ constipation, some blood in the toilete paper  (-) cough, sputum production (-) wheezing, chest congestion  No anxiety, depression   Past Medical History  Diagnosis Date  . 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 Neck pain     local injections x 2, las summer 2011 (Dr.Vortek)  . GERD (gastroesophageal reflux disease)   . Hypertension   . Cirrhosis     per CT, saw GI 11-2010, Bx was Rx , pt cancelled    . Hemorrhoids     Past Surgical History  Procedure Laterality Date  . Inguinal hernia repair      History   Social History  . Marital Status: Single    Spouse Name: N/A    Number of Children: 0  . Years of Education: N/A   Occupational History  . Education officer, museum, works at a high school    . back to school, masters school administration    Social History Main Topics  . Smoking status: Never Smoker   . Smokeless tobacco: Never Used  . Alcohol Use: Yes     Comment: beer rarely  . Drug Use: No  . Sexual Activity: Not on file   Other Topics Concern  . Not on file   Social History Narrative         Family History  Problem Relation Age of Onset  . Coronary artery disease Maternal Grandmother   . Hypertension Mother   . Stroke Mother   . Diabetes Neg Hx   . Colon cancer Neg Hx   . Prostate cancer Maternal Grandfather 37  . Hypertension Father   . Hypertension Maternal Grandmother   . Breast cancer Paternal Grandmother   . Pancreatic cancer Paternal Aunt        Medication List       This list is accurate as of: 04/30/13 11:59 PM.  Always  use your most recent med list.               celecoxib 200 MG capsule  Commonly known as:  CELEBREX     losartan 50 MG tablet  Commonly known as:  COZAAR  take 1 tablet by mouth once daily     MIRALAX powder  Generic drug:  polyethylene glycol powder  Take 17 g by mouth 2 (two) times a week.     psyllium 58.6 % powder  Commonly known as:  METAMUCIL  Take 1 packet by mouth daily.     valACYclovir 1000 MG tablet  Commonly known as:  VALTREX  take 2 tablets by mouth every morning and 2 every evening FOR EACH OUTBREAK.           Objective:   Physical Exam BP 130/83  Pulse 84  Temp(Src) 97.9 F (36.6 C)  Ht 5' 10.5" (1.791 m)  Wt 193 lb (87.544 kg)  BMI 27.29 kg/m2  SpO2 100% General -- alert, well-developed, NAD.  Neck --no thyromegaly  HEENT-- Not pale.  Lungs -- normal respiratory effort, no intercostal retractions, no accessory muscle use, and normal breath sounds.  Heart-- normal rate, regular rhythm, no murmur.  Abdomen-- Not distended, good bowel sounds,soft, non-tender. Rectal--  Exam very limited, anal sphincter tone was quite high,No external abnormalities noted. . No rectal masses or tenderness. No stools found Unable to do anoscopy d/t sphincter tone Prostate-- Limited exam, slt enlarged? Extremities-- no pretibial edema bilaterally  Neurologic--  alert & oriented X3. Speech normal, gait normal, strength normal in all extremities.   Psych-- Cognition and judgment appear intact. Cooperative with normal attention span and concentration. No anxious or depressed appearing.      Assessment & Plan:

## 2013-05-01 LAB — HEPATITIS A ANTIBODY, TOTAL: HEP A TOTAL AB: REACTIVE — AB

## 2013-06-04 ENCOUNTER — Emergency Department (HOSPITAL_COMMUNITY)
Admission: EM | Admit: 2013-06-04 | Discharge: 2013-06-04 | Disposition: A | Discharge status: Home / Self Care | Payer: BC Managed Care – PPO | Source: Home / Self Care

## 2013-06-04 ENCOUNTER — Encounter (HOSPITAL_COMMUNITY): Payer: Self-pay | Admitting: Emergency Medicine

## 2013-06-04 DIAGNOSIS — J309 Allergic rhinitis, unspecified: Secondary | ICD-10-CM

## 2013-06-04 MED ORDER — GUAIFENESIN-CODEINE 100-10 MG/5ML PO SYRP: 5.0000 mL | ORAL_SOLUTION | ORAL | Status: DC | PRN

## 2013-06-04 NOTE — ED Notes (Signed)
Pt c/o allergies onset Friday Sxs include: PND, dry cough, ST Sxs increase at night w/some SOB Denies f/v/n/d Taking Mucinex DM and Zyrtec w/no relief Alert w/no signs of acute distress.

## 2013-06-04 NOTE — ED Provider Notes (Signed)
CSN: [id number]     Arrival date & time 06/04/13  0803 History   First MD Initiated Contact with Patient 06/04/13 [phone]     Chief Complaint  Patient presents with  . Allergies   (Consider location/radiation/quality/duration/timing/severity/associated sxs/prior Treatment) HPI Comments: C/O typical seasonal allergy sx's    Past Medical History  Diagnosis Date  . 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 Neck pain     local injections x 2, las summer 2011 (Dr.Vortek)  . GERD (gastroesophageal reflux disease)   . Hypertension   . Cirrhosis     per CT, saw GI 11-2010, Bx was Rx , pt cancelled    . Hemorrhoids    Past Surgical History  Procedure Laterality Date  . Inguinal hernia repair     Family History  Problem Relation Age of Onset  . Coronary artery disease Maternal Grandmother   . Hypertension Mother   . Stroke Mother   . Diabetes Neg Hx   . Colon cancer Neg Hx   . Prostate cancer Maternal Grandfather 43  . Hypertension Father   . Hypertension Maternal Grandmother   . Breast cancer Paternal Grandmother   . Pancreatic cancer Paternal Aunt    History  Substance Use Topics  . Smoking status: Never Smoker   . Smokeless tobacco: Never Used  . Alcohol Use: Yes     Comment: beer rarely    Review of Systems  Constitutional: Negative for fever, diaphoresis, activity change and fatigue.  HENT: Positive for congestion, postnasal drip, rhinorrhea and sore throat. Negative for ear pain, facial swelling and trouble swallowing.   Eyes: Negative for pain, discharge and redness.  Respiratory: Positive for cough. Negative for chest tightness and shortness of breath.   Cardiovascular: Negative.   Gastrointestinal: Negative.   Musculoskeletal: Negative.  Negative for neck pain and neck stiffness.  Neurological: Negative.     Allergies  Amoxicillin  Home Medications   Current Outpatient Rx  Name  Route  Sig  Dispense  Refill  . losartan (COZAAR) 50 MG tablet     take 1 tablet by mouth once daily   90 tablet   2   . celecoxib (CELEBREX) 200 MG capsule               . guaiFENesin-codeine (CHERATUSSIN AC) 100-10 MG/5ML syrup   Oral   Take 5 mLs by mouth every 4 (four) hours as needed for cough or congestion.   120 mL   0   . polyethylene glycol powder (MIRALAX) powder   Oral   Take 17 g by mouth 2 (two) times a week.           . psyllium (METAMUCIL) 58.6 % powder   Oral   Take 1 packet by mouth daily.           . valACYclovir (VALTREX) 1000 MG tablet      take 2 tablets by mouth every morning and 2 every evening FOR EACH OUTBREAK.   30 tablet   1    BP 136/88  Pulse 89  Temp(Src) 98.4 F (36.9 C) (Oral)  Resp 18  SpO2 100% Physical Exam  Nursing note and vitals reviewed. Constitutional: He is oriented to person, place, and time. He appears well-developed and well-nourished. No distress.  HENT:  Bilat Tm's nl OP with cobblestoning and clear PND  Eyes: Conjunctivae and EOM are normal.  Neck: Normal range of motion. Neck supple.  Cardiovascular: Normal rate  and regular rhythm.   Pulmonary/Chest: Effort normal and breath sounds normal. No respiratory distress. He has no wheezes. He has no rales.  Musculoskeletal: Normal range of motion. He exhibits no edema.  Lymphadenopathy:    He has no cervical adenopathy.  Neurological: He is alert and oriented to person, place, and time.  Skin: Skin is warm and dry. No rash noted.  Psychiatric: He has a normal mood and affect.    ED Course  Procedures (including critical care time) Labs Review Labs Reviewed - No data to display Imaging Review No results found.   MDM   1. Allergic rhinitis     Flonase and saline NS Allegra 180 Sudafed PE Lots of fluids     Janne Napoleon, NP 06/04/13 [phone]

## 2013-06-04 NOTE — Discharge Instructions (Signed)
Allergic Rhinitis Allegra 180 mg a day Flonase nasal spray and saline nasal spray For congestion sudafed PE 10 mg every 4 hours as needed For additional help with drainage chlor-trimeton 2-4 mg as directed Allergic rhinitis is when the mucous membranes in the nose respond to allergens. Allergens are particles in the air that cause your body to have an allergic reaction. This causes you to release allergic antibodies. Through a chain of events, these eventually cause you to release histamine into the blood stream. Although meant to protect the body, it is this release of histamine that causes your discomfort, such as frequent sneezing, congestion, and an itchy, runny nose.  CAUSES  Seasonal allergic rhinitis (hay fever) is caused by pollen allergens that may come from grasses, trees, and weeds. Year-round allergic rhinitis (perennial allergic rhinitis) is caused by allergens such as house dust mites, pet dander, and mold spores.  SYMPTOMS   Nasal stuffiness (congestion).  Itchy, runny nose with sneezing and tearing of the eyes. DIAGNOSIS  Your health care provider can help you determine the allergen or allergens that trigger your symptoms. If you and your health care provider are unable to determine the allergen, skin or blood testing may be used. TREATMENT  Allergic Rhinitis does not have a cure, but it can be controlled by:  Medicines and allergy shots (immunotherapy).  Avoiding the allergen. Hay fever may often be treated with antihistamines in pill or nasal spray forms. Antihistamines block the effects of histamine. There are over-the-counter medicines that may help with nasal congestion and swelling around the eyes. Check with your health care provider before taking or giving this medicine.  If avoiding the allergen or the medicine prescribed do not work, there are many new medicines your health care provider can prescribe. Stronger medicine may be used if initial measures are ineffective.  Desensitizing injections can be used if medicine and avoidance does not work. Desensitization is when a patient is given ongoing shots until the body becomes less sensitive to the allergen. Make sure you follow up with your health care provider if problems continue. HOME CARE INSTRUCTIONS It is not possible to completely avoid allergens, but you can reduce your symptoms by taking steps to limit your exposure to them. It helps to know exactly what you are allergic to so that you can avoid your specific triggers. SEEK MEDICAL CARE IF:   You have a fever.  You develop a cough that does not stop easily (persistent).  You have shortness of breath.  You start wheezing.  Symptoms interfere with normal daily activities. Document Released: 11/08/2000 Document Revised: 12/04/2012 Document Reviewed: 10/21/2012 [facility] Patient Information 2014 [place].

## 2013-06-05 ENCOUNTER — Other Ambulatory Visit (INDEPENDENT_AMBULATORY_CARE_PROVIDER_SITE_OTHER): Payer: BC Managed Care – PPO

## 2013-06-05 DIAGNOSIS — Z0000 Encounter for general adult medical examination without abnormal findings: Secondary | ICD-10-CM

## 2013-06-05 LAB — FECAL OCCULT BLOOD, IMMUNOCHEMICAL: Fecal Occult Bld: NEGATIVE

## 2013-06-05 NOTE — ED Provider Notes (Signed)
Medical screening examination/treatment/procedure(s) were performed by non-physician practitioner and as supervising physician I was immediately available for consultation/collaboration.  Philipp Deputy, M.D.  Harden Mo, MD 06/05/13 [phone]

## 2013-06-06 ENCOUNTER — Telehealth: Payer: Self-pay

## 2013-06-06 MED ORDER — BENZONATATE 200 MG PO CAPS: 200.0000 mg | ORAL_CAPSULE | Freq: Two times a day (BID) | ORAL | Status: DC | PRN

## 2013-06-06 NOTE — Telephone Encounter (Signed)
Unable to lmovm . Voicemail full

## 2013-06-06 NOTE — Telephone Encounter (Signed)
Recommend  take Mucinex DM twice a day as needed (if not doing already) Use OTC Nasocort: 2 nasal sprays on each side of the nose daily until you feel better I sent a Rx for tessalon Perles to take in addition to other meds

## 2013-06-06 NOTE — Telephone Encounter (Signed)
Patient called with c/o severe coughing, sneezing, and nasal drainage. States that the cough is the worse part.  States it "feels like I'm coughing up a lung."  Patient went to Urgent Care on 4/8 for similar symptoms.  He was prescribed guanifenesin-codeine and has had very little relief.  He wants to know if there is anything else that he could take that could be helpful.  He has an upcoming appt on 4/13 at 2pm.    Please advise.

## 2013-06-09 ENCOUNTER — Ambulatory Visit: Payer: BC Managed Care – PPO | Admitting: Internal Medicine

## 2013-06-09 NOTE — Telephone Encounter (Signed)
Spoke with pt. Pt states went to a different urgent care Saturday dx bronchitis was given prednisone and antibiotic will let us know if s/s get better or worse.

## 2013-07-28 HISTORY — PX: KNEE ARTHROSCOPY: SHX127

## 2013-08-19 ENCOUNTER — Telehealth: Payer: Self-pay | Admitting: Internal Medicine

## 2013-08-19 NOTE — Telephone Encounter (Signed)
Spoke with patient and made aware that increased water intake was directly related to extra urination. Reviewed most recent labs and advised that his glucose levels were in the normal range.

## 2013-08-19 NOTE — Telephone Encounter (Signed)
Caller name: Gregory Klein Relation to pt: patient Call back number: [phone] Pharmacy:  Reason for call: patient called stating that he has been drinking a lot of water lately and urinating frequently. Patient is wondering if this is normal also he would like to discus with a nurse his last recent lab work. Please advise.

## 2013-09-10 ENCOUNTER — Other Ambulatory Visit: Payer: Self-pay | Admitting: Orthopedic Surgery

## 2013-09-10 DIAGNOSIS — M542 Cervicalgia: Secondary | ICD-10-CM

## 2013-09-12 ENCOUNTER — Ambulatory Visit
Admission: RE | Admit: 2013-09-12 | Discharge: 2013-09-12 | Disposition: A | Discharge status: Home / Self Care | Payer: BC Managed Care – PPO | Source: Ambulatory Visit | Attending: Orthopedic Surgery | Admitting: Orthopedic Surgery

## 2013-09-12 DIAGNOSIS — M542 Cervicalgia: Secondary | ICD-10-CM

## 2013-09-12 MED ORDER — IOHEXOL 300 MG/ML  SOLN
1.0000 mL | Freq: Once | INTRAMUSCULAR | Status: AC | PRN
  Administered 2013-09-12: 1 mL via EPIDURAL

## 2013-09-12 MED ORDER — TRIAMCINOLONE ACETONIDE 40 MG/ML IJ SUSP (RADIOLOGY)
60.0000 mg | Freq: Once | INTRAMUSCULAR | Status: AC
  Administered 2013-09-12: 60 mg via EPIDURAL

## 2013-09-12 NOTE — Discharge Instructions (Signed)

## 2013-10-07 ENCOUNTER — Other Ambulatory Visit: Payer: Self-pay | Admitting: Orthopedic Surgery

## 2013-10-07 DIAGNOSIS — M47812 Spondylosis without myelopathy or radiculopathy, cervical region: Secondary | ICD-10-CM

## 2013-10-10 ENCOUNTER — Ambulatory Visit
Admission: RE | Admit: 2013-10-10 | Discharge: 2013-10-10 | Disposition: A | Discharge status: Home / Self Care | Payer: BC Managed Care – PPO | Source: Ambulatory Visit | Attending: Orthopedic Surgery | Admitting: Orthopedic Surgery

## 2013-10-10 DIAGNOSIS — M47812 Spondylosis without myelopathy or radiculopathy, cervical region: Secondary | ICD-10-CM

## 2013-10-10 MED ORDER — IOHEXOL 300 MG/ML  SOLN
1.0000 mL | Freq: Once | INTRAMUSCULAR | Status: AC | PRN
  Administered 2013-10-10: 1 mL via EPIDURAL

## 2013-10-10 MED ORDER — TRIAMCINOLONE ACETONIDE 40 MG/ML IJ SUSP (RADIOLOGY)
60.0000 mg | Freq: Once | INTRAMUSCULAR | Status: AC
  Administered 2013-10-10: 60 mg via EPIDURAL

## 2013-10-15 ENCOUNTER — Other Ambulatory Visit: Payer: BC Managed Care – PPO

## 2013-11-04 ENCOUNTER — Other Ambulatory Visit: Payer: Self-pay | Admitting: Internal Medicine

## 2013-11-10 ENCOUNTER — Encounter: Payer: Self-pay | Admitting: Internal Medicine

## 2013-11-10 ENCOUNTER — Ambulatory Visit (INDEPENDENT_AMBULATORY_CARE_PROVIDER_SITE_OTHER): Payer: BC Managed Care – PPO | Admitting: Internal Medicine

## 2013-11-10 VITALS — BP 130/68 | HR 79 | Temp 98.0°F | Wt 193.5 lb

## 2013-11-10 DIAGNOSIS — K7469 Other cirrhosis of liver: Secondary | ICD-10-CM

## 2013-11-10 DIAGNOSIS — K7460 Unspecified cirrhosis of liver: Secondary | ICD-10-CM

## 2013-11-10 DIAGNOSIS — I10 Essential (primary) hypertension: Secondary | ICD-10-CM

## 2013-11-10 DIAGNOSIS — M542 Cervicalgia: Secondary | ICD-10-CM | POA: Insufficient documentation

## 2013-11-10 LAB — COMPREHENSIVE METABOLIC PANEL
ALK PHOS: 54 U/L (ref 39–117)
ALT: 17 U/L (ref 0–53)
AST: 20 U/L (ref 0–37)
Albumin: 3.5 g/dL (ref 3.5–5.2)
BILIRUBIN TOTAL: 0.7 mg/dL (ref 0.2–1.2)
BUN: 12 mg/dL (ref 6–23)
CALCIUM: 8.9 mg/dL (ref 8.4–10.5)
CHLORIDE: 102 meq/L (ref 96–112)
CO2: 29 mEq/L (ref 19–32)
CREATININE: 1.2 mg/dL (ref 0.4–1.5)
GFR: 81.24 mL/min (ref >60.00)
Glucose, Bld: 104 mg/dL — ABNORMAL HIGH (ref 70–99)
Potassium: 4.1 mEq/L (ref 3.5–5.1)
Sodium: 137 mEq/L (ref 135–145)
Total Protein: 7 g/dL (ref 6.0–8.3)

## 2013-11-10 MED ORDER — VALACYCLOVIR HCL 1 G PO TABS: ORAL_TABLET | ORAL | Status: DC

## 2013-11-10 NOTE — Progress Notes (Signed)
Pre visit review using our clinic review tool, if applicable. No additional management support is needed unless otherwise documented below in the visit note. 

## 2013-11-10 NOTE — Assessment & Plan Note (Signed)
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 Their plan is to proceed w/ neck surgery 01/2014

## 2013-11-10 NOTE — Assessment & Plan Note (Addendum)
Patient concerned about a diagnosis of cirrhosis, so far no obvious complications, at some point he was recommended biopsy that he never proceeded, at this time he is interested. he is immune to hepatitis a Plan: Refer to GI

## 2013-11-10 NOTE — Progress Notes (Signed)
Subjective:    Patient ID: Gregory Klein, male    DOB: Apr 24, 1965, 48 y.o.   MRN: [id number]  DOS:  11/10/2013 Type of visit - description : routine Interval history: In general feeling well, medication list reviewed, good compliance. No ambulatory BPs. was seen with red blood per rectum, symptoms resolved. Wonders how his liver is doing, has a  history of cirrhosis  ROS Status post a knee arthroscopy 07/2013, recovering well. No knee pain at this time. celebrex  was discontinued. Still has  neck pain, Occasional numbness at the right thumb, saw neurosurgery, they are planning a procedure December 2015.      Past Medical History  Diagnosis Date  . 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 Neck pain     local injections x 2, las summer 2011 (Dr.Vortek)  . GERD (gastroesophageal reflux disease)   . Hypertension   . Cirrhosis     per CT, saw GI 11-2010, Bx was Rx , pt cancelled    . Hemorrhoids     Past Surgical History  Procedure Laterality Date  . Inguinal hernia repair    . Knee arthroscopy  6-15    Dr Mardelle Matte    History   Social History  . Marital Status: Single    Spouse Name: N/A    Number of Children: 0  . Years of Education: N/A   Occupational History  . Education officer, museum, works at a high school    . back to school, masters school administration    Social History Main Topics  . Smoking status: Never Smoker   . Smokeless tobacco: Never Used  . Alcohol Use: Yes     Comment: beer rarely  . Drug Use: No  . Sexual Activity: Not on file   Other Topics Concern  . Not on file   Social History Narrative            Medication List       This list is accurate as of: 11/10/13 11:59 PM.  Always use your most recent med list.               etodolac 200 MG capsule  Commonly known as:  LODINE  Take 200 mg by mouth every 8 (eight) hours. Per Dr Lynann Bologna     losartan 50 MG tablet  Commonly known as:  COZAAR  take 1 tablet by mouth once daily     MIRALAX powder  Generic drug:  polyethylene glycol powder  Take 17 g by mouth 2 (two) times a week.     psyllium 58.6 % powder  Commonly known as:  METAMUCIL  Take 1 packet by mouth daily.     valACYclovir 1000 MG tablet  Commonly known as:  VALTREX  take 2 tablets by mouth every morning and 2 every evening FOR EACH OUTBREAK.           Objective:   Physical Exam BP 130/68  Pulse 79  Temp(Src) 98 F (36.7 C) (Oral)  Wt 193 lb 8 oz (87.771 kg)  SpO2 98% General -- alert, well-developed, NAD.  Lungs -- normal respiratory effort, no intercostal retractions, no accessory muscle use, and normal breath sounds.  Heart-- normal rate, regular rhythm, no murmur.   Extremities-- no pretibial edema bilaterally  Neurologic--  alert & oriented X3. Speech normal, gait appropriate for age, strength symmetric and appropriate for age.  Psych-- Cognition and judgment appear intact. Cooperative with normal attention  span and concentration. No anxious or depressed appearing.     Assessment & Plan:

## 2013-11-10 NOTE — Patient Instructions (Signed)
Get your blood work before you leave   Please come back to the office by 04-2014 for a physical exam. Come back fasting    

## 2013-11-10 NOTE — Assessment & Plan Note (Signed)
On losartan , seems to be well-controlled, recommend to check his BP once a month in the ambulatory setting, check a BMP

## 2013-11-14 ENCOUNTER — Encounter: Payer: Self-pay | Admitting: Gastroenterology

## 2013-11-17 ENCOUNTER — Other Ambulatory Visit: Payer: Self-pay | Admitting: Neurosurgery

## 2013-12-26 ENCOUNTER — Inpatient Hospital Stay (HOSPITAL_BASED_OUTPATIENT_CLINIC_OR_DEPARTMENT_OTHER)
Admission: EM | Admit: 2013-12-26 | Discharge: 2013-12-30 | DRG: 287 | Disposition: A | Discharge status: Home / Self Care | Payer: BC Managed Care – PPO | Attending: Cardiovascular Disease | Admitting: Cardiovascular Disease

## 2013-12-26 ENCOUNTER — Encounter: Payer: Self-pay | Admitting: Medical

## 2013-12-26 ENCOUNTER — Ambulatory Visit (INDEPENDENT_AMBULATORY_CARE_PROVIDER_SITE_OTHER): Payer: BC Managed Care – PPO | Admitting: Medical

## 2013-12-26 ENCOUNTER — Other Ambulatory Visit: Payer: Self-pay

## 2013-12-26 ENCOUNTER — Encounter: Payer: Self-pay | Admitting: Cardiology

## 2013-12-26 ENCOUNTER — Encounter (HOSPITAL_BASED_OUTPATIENT_CLINIC_OR_DEPARTMENT_OTHER): Payer: Self-pay | Admitting: Emergency Medicine

## 2013-12-26 ENCOUNTER — Emergency Department (HOSPITAL_BASED_OUTPATIENT_CLINIC_OR_DEPARTMENT_OTHER): Payer: BC Managed Care – PPO

## 2013-12-26 VITALS — BP 109/76 | HR 82 | Temp 98.2°F | Ht 70.5 in | Wt 182.6 lb

## 2013-12-26 DIAGNOSIS — I209 Angina pectoris, unspecified: Secondary | ICD-10-CM

## 2013-12-26 DIAGNOSIS — R079 Chest pain, unspecified: Secondary | ICD-10-CM

## 2013-12-26 DIAGNOSIS — R0789 Other chest pain: Secondary | ICD-10-CM

## 2013-12-26 DIAGNOSIS — I10 Essential (primary) hypertension: Secondary | ICD-10-CM | POA: Diagnosis present

## 2013-12-26 DIAGNOSIS — J020 Streptococcal pharyngitis: Secondary | ICD-10-CM

## 2013-12-26 DIAGNOSIS — I409 Acute myocarditis, unspecified: Secondary | ICD-10-CM

## 2013-12-26 DIAGNOSIS — B1910 Unspecified viral hepatitis B without hepatic coma: Secondary | ICD-10-CM | POA: Diagnosis present

## 2013-12-26 DIAGNOSIS — I514 Myocarditis, unspecified: Secondary | ICD-10-CM | POA: Diagnosis present

## 2013-12-26 DIAGNOSIS — I308 Other forms of acute pericarditis: Principal | ICD-10-CM | POA: Diagnosis present

## 2013-12-26 DIAGNOSIS — I309 Acute pericarditis, unspecified: Secondary | ICD-10-CM

## 2013-12-26 DIAGNOSIS — K7460 Unspecified cirrhosis of liver: Secondary | ICD-10-CM | POA: Diagnosis present

## 2013-12-26 DIAGNOSIS — J029 Acute pharyngitis, unspecified: Secondary | ICD-10-CM | POA: Insufficient documentation

## 2013-12-26 DIAGNOSIS — K219 Gastro-esophageal reflux disease without esophagitis: Secondary | ICD-10-CM | POA: Diagnosis present

## 2013-12-26 HISTORY — DX: Acute myocarditis, unspecified: I40.9

## 2013-12-26 LAB — CBC
HEMATOCRIT: 41.1 % (ref 39.0–52.0)
HEMOGLOBIN: 13.7 g/dL (ref 13.0–17.0)
MCH: 27.2 pg (ref 26.0–34.0)
MCHC: 33.3 g/dL (ref 30.0–36.0)
MCV: 81.5 fL (ref 78.0–100.0)
Platelets: 246 10*3/uL (ref 150–400)
RBC: 5.04 MIL/uL (ref 4.22–5.81)
RDW: 14.1 % (ref 11.5–15.5)
WBC: 12.4 10*3/uL — ABNORMAL HIGH (ref 4.0–10.5)

## 2013-12-26 LAB — COMPREHENSIVE METABOLIC PANEL
ALT: 38 U/L (ref 0–53)
AST: 97 U/L — ABNORMAL HIGH (ref 0–37)
Albumin: 3.1 g/dL — ABNORMAL LOW (ref 3.5–5.2)
Alkaline Phosphatase: 65 U/L (ref 39–117)
Anion gap: 11 (ref 5–15)
BUN: 11 mg/dL (ref 6–23)
CO2: 30 mEq/L (ref 19–32)
Calcium: 8.9 mg/dL (ref 8.4–10.5)
Chloride: 101 mEq/L (ref 96–112)
Creatinine, Ser: 1.1 mg/dL (ref 0.50–1.35)
GFR calc Af Amer: >90 mL/min (ref >90)
GFR calc non Af Amer: 79 mL/min — ABNORMAL LOW (ref >90)
Glucose, Bld: 95 mg/dL (ref 70–99)
Potassium: 3.6 mEq/L — ABNORMAL LOW (ref 3.7–5.3)
Sodium: 142 mEq/L (ref 137–147)
Total Bilirubin: 0.9 mg/dL (ref 0.3–1.2)
Total Protein: 7.1 g/dL (ref 6.0–8.3)

## 2013-12-26 LAB — TROPONIN I
Troponin I: 9.05 ng/mL (ref <0.30)
Troponin I: >20 ng/mL (ref <0.30)
Troponin I: 13.71 ng/mL (ref <0.30)

## 2013-12-26 LAB — BASIC METABOLIC PANEL
Anion gap: 13 (ref 5–15)
BUN: 13 mg/dL (ref 6–23)
CALCIUM: 9.4 mg/dL (ref 8.4–10.5)
CO2: 29 mEq/L (ref 19–32)
Chloride: 98 mEq/L (ref 96–112)
Creatinine, Ser: 1.2 mg/dL (ref 0.50–1.35)
GFR, EST AFRICAN AMERICAN: 82 mL/min — AB (ref >90)
GFR, EST NON AFRICAN AMERICAN: 71 mL/min — AB (ref >90)
GLUCOSE: 115 mg/dL — AB (ref 70–99)
Potassium: 3.7 mEq/L (ref 3.7–5.3)
Sodium: 140 mEq/L (ref 137–147)

## 2013-12-26 LAB — PROTIME-INR
INR: 1.15 (ref 0.00–1.49)
Prothrombin Time: 14.9 seconds (ref 11.6–15.2)

## 2013-12-26 LAB — SEDIMENTATION RATE: Sed Rate: 25 mm/hr — ABNORMAL HIGH (ref 0–16)

## 2013-12-26 MED ORDER — ONDANSETRON HCL 4 MG/2ML IJ SOLN: 4.0000 mg | Freq: Four times a day (QID) | INTRAMUSCULAR | Status: DC | PRN

## 2013-12-26 MED ORDER — HEPARIN BOLUS VIA INFUSION
4000.0000 [IU] | Freq: Once | INTRAVENOUS | Status: AC
  Administered 2013-12-26: 4000 [IU] via INTRAVENOUS
  Filled 2013-12-26: qty 4000

## 2013-12-26 MED ORDER — ZOLPIDEM TARTRATE 5 MG PO TABS: 5.0000 mg | ORAL_TABLET | Freq: Every evening | ORAL | Status: DC | PRN

## 2013-12-26 MED ORDER — ACETAMINOPHEN 325 MG PO TABS
650.0000 mg | ORAL_TABLET | ORAL | Status: DC | PRN
  Administered 2013-12-26 – 2013-12-28 (×2): 650 mg via ORAL
  Filled 2013-12-26 (×3): qty 2

## 2013-12-26 MED ORDER — NITROGLYCERIN 0.4 MG SL SUBL
0.4000 mg | SUBLINGUAL_TABLET | SUBLINGUAL | Status: DC | PRN
Start: 2013-12-26 — End: 2013-12-30

## 2013-12-26 MED ORDER — ALPRAZOLAM 0.25 MG PO TABS: 0.2500 mg | ORAL_TABLET | Freq: Two times a day (BID) | ORAL | Status: DC | PRN

## 2013-12-26 MED ORDER — ENOXAPARIN SODIUM 40 MG/0.4ML ~~LOC~~ SOLN
40.0000 mg | SUBCUTANEOUS | Status: DC
  Filled 2013-12-26: qty 0.4

## 2013-12-26 MED ORDER — AZITHROMYCIN 250 MG PO TABS: ORAL_TABLET | ORAL | Status: DC

## 2013-12-26 MED ORDER — KETOROLAC TROMETHAMINE 30 MG/ML IJ SOLN
30.0000 mg | Freq: Once | INTRAMUSCULAR | Status: AC
  Administered 2013-12-26: 30 mg via INTRAVENOUS
  Filled 2013-12-26: qty 1

## 2013-12-26 MED ORDER — HEPARIN (PORCINE) IN NACL 100-0.45 UNIT/ML-% IJ SOLN
1100.0000 [IU]/h | INTRAMUSCULAR | Status: DC
  Administered 2013-12-26 – 2013-12-27 (×2): 1000 [IU]/h via INTRAVENOUS
  Filled 2013-12-26 (×5): qty 250

## 2013-12-26 MED ORDER — ASPIRIN EC 81 MG PO TBEC
81.0000 mg | DELAYED_RELEASE_TABLET | Freq: Every day | ORAL | Status: DC
  Administered 2013-12-27 – 2013-12-30 (×3): 81 mg via ORAL
  Filled 2013-12-26 (×4): qty 1

## 2013-12-26 NOTE — H&P (Addendum)
Patient ID: Gregory Klein MRN: [id number], DOB/AGE: 1965-05-02   Admit date: 12/26/2013   Primary Physician: Kathlene November, MD Primary Cardiologist: Dr Sallyanne Kuster (new)  HPI: 48 y/o AA male with no prior history of CAD, presented to the [facility] [facility] this pm with complaints of chest pain. He was actually referred from his PCP. He had been treated earlier this week for strep and had returned to work. Pt states last night states very vague very low discomfort to left chest. This symptoms never went away. No shortness of breath and no wheezing. In the ER he had diffuse ST elevation with elevated Troponin. It is suspected he has acute myocarditis. He is transferred now for further evaluation.     Problem List: Past Medical History  Diagnosis Date  . 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 Neck pain     local injections x 2, las summer 2011 (Dr.Vortek)  . GERD (gastroesophageal reflux disease)   . Hypertension   . Cirrhosis     per CT, saw GI 11-2010, Bx was Rx , pt cancelled    . Hemorrhoids   . Strep pharyngitis     Past Surgical History  Procedure Laterality Date  . Inguinal hernia repair    . Knee arthroscopy  6-15    Dr Mardelle Matte     Allergies:  Allergies  Allergen Reactions  . Amoxicillin Rash     Home Medications Current Facility-Administered Medications  Medication Dose Route Frequency Provider Last Rate Last Dose  . acetaminophen (TYLENOL) tablet 650 mg  650 mg Oral Q4H PRN Erlene Quan, PA-C      . ALPRAZolam Duanne Moron) tablet 0.25 mg  0.25 mg Oral BID PRN Erlene Quan, PA-C      . [START ON 12/27/2013] aspirin EC tablet 81 mg  81 mg Oral Daily Luke K Kilroy, PA-C      . enoxaparin (LOVENOX) injection 40 mg  40 mg Subcutaneous Q24H Luke K Kilroy, PA-C      . nitroGLYCERIN (NITROSTAT) SL tablet 0.4 mg  0.4 mg Sublingual Q5 Min x 3 PRN Luke K Kilroy, PA-C      . ondansetron [facility]) injection 4 mg  4 mg Intravenous Q6H PRN Luke K Kilroy, PA-C      .  zolpidem (AMBIEN) tablet 5 mg  5 mg Oral QHS PRN,MR X 1 Erlene Quan, PA-C         Family History  Problem Relation Age of Onset  . Coronary artery disease Maternal Grandmother   . Hypertension Mother   . Stroke Mother   . Diabetes Neg Hx   . Colon cancer Neg Hx   . Prostate cancer Maternal Grandfather 42  . Hypertension Father   . Hypertension Maternal Grandmother   . Breast cancer Paternal Grandmother   . Pancreatic cancer Paternal Aunt      History   Social History  . Marital Status: Single    Spouse Name: N/A    Number of Children: 0  . Years of Education: N/A   Occupational History  . Education officer, museum, works at a high school    . back to school, masters school administration    Social History Main Topics  . Smoking status: Never Smoker   . Smokeless tobacco: Never Used  . Alcohol Use: Yes     Comment: beer rarely  . Drug Use: No  . Sexual Activity: Not on file  Other Topics Concern  . Not on file   Social History Narrative         Review of Systems: General: negative for chills, fever, night sweats or weight changes.  Cardiovascular: negative for dyspnea on exertion, edema, orthopnea, palpitations, paroxysmal nocturnal dyspnea or shortness of breath Dermatological: negative for rash Respiratory: negative for cough or wheezing Urologic: negative for hematuria Abdominal: negative for nausea, vomiting, diarrhea, bright red blood per rectum, melena, or hematemesis Neurologic: negative for visual changes, syncope, or dizziness All other systems reviewed and are otherwise negative except as noted above.  Physical Exam: Blood pressure 111/81, pulse 84, temperature 97.8 F (36.6 C), temperature source Oral, resp. rate 20, height 5\' 11"  (1.803 m), weight 182 lb (82.555 kg), SpO2 99.00%.  General appearance: alert, cooperative and no distress Neck: no carotid bruit and no JVD Lungs: clear to auscultation bilaterally Heart: regular rate and rhythm Abdomen:  soft, non-tender; bowel sounds normal; no masses,  no organomegaly Extremities: extremities normal, atraumatic, no cyanosis or edema Pulses: 2+ and symmetric Skin: Skin color, texture, turgor normal. No rashes or lesions Neurologic: Grossly normal    Labs:   Results for orders placed during the hospital encounter of 12/26/13 (from the past 24 hour(s))  TROPONIN I     Status: Abnormal   Collection Time    12/26/13 11:25 AM      Result Value Ref Range   Troponin I 9.05 (*) <0.30 ng/mL  CBC     Status: Abnormal   Collection Time    12/26/13 11:25 AM      Result Value Ref Range   WBC 12.4 (*) 4.0 - 10.5 K/uL   RBC 5.04  4.22 - 5.81 MIL/uL   Hemoglobin 13.7  13.0 - 17.0 g/dL   HCT 41.1  39.0 - 52.0 %   MCV 81.5  78.0 - 100.0 fL   MCH 27.2  26.0 - 34.0 pg   MCHC 33.3  30.0 - 36.0 g/dL   RDW 14.1  11.5 - 15.5 %   Platelets 246  150 - 400 K/uL  BASIC METABOLIC PANEL     Status: Abnormal   Collection Time    12/26/13 11:25 AM      Result Value Ref Range   Sodium 140  137 - 147 mEq/L   Potassium 3.7  3.7 - 5.3 mEq/L   Chloride 98  96 - 112 mEq/L   CO2 29  19 - 32 mEq/L   Glucose, Bld 115 (*) 70 - 99 mg/dL   BUN 13  6 - 23 mg/dL   Creatinine, Ser 1.20  0.50 - 1.35 mg/dL   Calcium 9.4  8.4 - 10.5 mg/dL   GFR calc non Af Amer 71 (*) >90 mL/min   GFR calc Af Amer 82 (*) >90 mL/min   Anion gap 13  5 - 15  TROPONIN I     Status: Abnormal   Collection Time    12/26/13  2:55 PM      Result Value Ref Range   Troponin I >20.00 (*) <0.30 ng/mL     Radiology/Studies: Dg Chest 2 View  12/26/2013   CLINICAL DATA:  Chest tightness, lightheaded.  EXAM: CHEST  2 VIEW  COMPARISON:  None.  FINDINGS: Heart and mediastinal contours are within normal limits. No focal opacities or effusions. No acute bony abnormality. Mild rightward scoliosis in the thoracic spine.  IMPRESSION: No active cardiopulmonary disease.   Electronically Signed   By: Rolm Baptise M.D.  On: 12/26/2013 12:52    EKG: NSR  diffuse ST elevation  ASSESSMENT AND PLAN:  Principal Problem:   Acute myopericarditis Active Problems:   Elevated troponin   Hx of type B viral hepatitis   HTN (hypertension)   Cirrhosis   PLAN: Admit, check echo, labs. Lovenox.    Henri Medal, PA-C 12/26/2013, 7:34 PM  I have seen and examined the patient along with KILROY,LUKE K, PA-C.  I have reviewed the chart, notes and new data.  I agree with PA's note.  Key new complaints: he had mild chest discomfort and jaw discomfort last evening, associated with a severe headache and mild nausea, that resolved after he slept overnight; no pleuritic component. He had near syncope at work today, with symptoms lasting several minutes. He did not have associated palpitations. He has never had syncope. He is now asymptomatic. Just completed an azithromycin course for confirmed strep throat. Recurrent chest discomfort today (mild) resolved with NSAIDs (Toradol). Key examination changes: he has a very loud S4, but no pericardial rub, no diastolic murmur and no other signs of CHF Key new findings / data: markedly elevated and rising cardiac troponin was already elevated upon initial presentation, suggesting myocardial injury may have occurred last night. ECG is not suggestive of acute coronary insufficiency, but rather of acute (myo) pericarditis with diffuse ST elevation and PR depression.  PLAN: Differential diagnosis is between acute myocardial infarction with secondary pericarditis and primary acute inflammatory myopericarditis (viral, rheumatic, etc.). Either disorder may have caused transient AV block or VT that caused his pre-syncope. Statistically, acute MI is much more likely, but the ECG is atypical and he has very limited risk factors (only male gender and HTN). Will give ASA and IV heparin, but hold off beta blocker for now. Echo may help guide diagnosis if it shows a clearly regional or global pattern of LV dysfunction. He  will need coronary angiography, but he is now symptom free and probably >18hours have passed since the initial event. Unless he develops chest pain, angio is no longer urgent. He is likely at risk for CHF, considering the magnitude of enzyme elevation, regardless whether the problem is coronary or primary myocardial.  Sanda Klein, MD, [facility] [phone] 12/26/2013, 7:59 PM

## 2013-12-26 NOTE — Patient Instructions (Signed)
You had positive strep test. Clinically much improved but by exam your throat is still swollen and your lymph nodes are moderately enlarged.  For you throat I will give you an additional 4 days or azithromycin since you are allergic to pcn.  For your chest pain which started last night and is still present now, I want you to go downstairs to our ED. You may benefit from cardiac enzymes study.  I will but in future lab to be done to evaluate a1-c since you have mild hyperglycemia.  Follow up in 7 days or as needed.

## 2013-12-26 NOTE — Progress Notes (Signed)
Echocardiogram 2D Echocardiogram has been performed.  Gregory Klein 12/26/2013, 9:09 PM

## 2013-12-26 NOTE — Assessment & Plan Note (Signed)
Since pt on exam does have persisting enlarged nodes and significant tonsillar hypertrophy. Did advise continuing azithromycin for 4 more days.

## 2013-12-26 NOTE — ED Provider Notes (Signed)
CSN: [id number]     Arrival date & time 12/26/13  1105 History   First MD Initiated Contact with Patient 12/26/13 1126     Chief Complaint  Patient presents with  . Chest Pain      Patient is a 48 y.o. male presenting with chest pain. The history is provided by the patient.  Chest Pain Pain location:  L chest Pain quality: pressure   Pain radiates to:  Does not radiate Pain severity:  Mild Duration:  1 day Timing:  Constant Progression:  Unchanged Relieved by:  Nothing Worsened by:  Nothing tried Associated symptoms: headache   Associated symptoms: no shortness of breath and not vomiting   Patient reports recent h/o strep throat - he was diagnosed earlier in the week, was placed on zithromax and started to feel improved He reports gradual onset of left sided chest pressure last night - it has been constant without any worsening.  It is not exertional.  Denies pleuritic CP.  No SOB reported.  He reports while at work this morning felt lightheaded but no syncope  He was seen by PCP this morning and sent for further evaluation.   Past Medical History  Diagnosis Date  . 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 Neck pain     local injections x 2, las summer 2011 (Dr.Vortek)  . GERD (gastroesophageal reflux disease)   . Hypertension   . Cirrhosis     per CT, saw GI 11-2010, Bx was Rx , pt cancelled    . Hemorrhoids   . Strep pharyngitis    Past Surgical History  Procedure Laterality Date  . Inguinal hernia repair    . Knee arthroscopy  6-15    Dr Mardelle Matte   Family History  Problem Relation Age of Onset  . Coronary artery disease Maternal Grandmother   . Hypertension Mother   . Stroke Mother   . Diabetes Neg Hx   . Colon cancer Neg Hx   . Prostate cancer Maternal Grandfather 36  . Hypertension Father   . Hypertension Maternal Grandmother   . Breast cancer Paternal Grandmother   . Pancreatic cancer Paternal Aunt    History  Substance Use Topics  . Smoking  status: Never Smoker   . Smokeless tobacco: Never Used  . Alcohol Use: Yes     Comment: beer rarely    Review of Systems  Constitutional: Positive for chills.  HENT: Positive for sore throat.   Respiratory: Negative for shortness of breath.   Cardiovascular: Positive for chest pain.  Gastrointestinal: Negative for vomiting.  Neurological: Positive for headaches. Negative for syncope.  All other systems reviewed and are negative.     Allergies  Amoxicillin  Home Medications   Prior to Admission medications   Medication Sig Start Date End Date Taking? Authorizing Provider  azithromycin (ZITHROMAX) 250 MG tablet 1 tab po x 4 days 12/26/13   Meriam Sprague Saguier, PA-C  etodolac (LODINE) 200 MG capsule Take 200 mg by mouth every 8 (eight) hours. Per Dr Lynann Bologna    Historical Provider, MD  losartan (COZAAR) 50 MG tablet take 1 tablet by mouth once daily 11/04/13   Colon Branch, MD  polyethylene glycol powder (MIRALAX) powder Take 17 g by mouth 2 (two) times a week.      Historical Provider, MD  psyllium (METAMUCIL) 58.6 % powder Take 1 packet by mouth daily.      Historical Provider, MD  valACYclovir (  VALTREX) 1000 MG tablet take 2 tablets by mouth every morning and 2 every evening FOR EACH OUTBREAK. 11/10/13   Colon Branch, MD   BP 115/75  Pulse 76  Temp(Src) 98.7 F (37.1 C) (Oral)  Resp 16  Ht 5\' 11"  (1.803 m)  Wt 182 lb (82.555 kg)  BMI 25.40 kg/m2  SpO2 99% Physical Exam CONSTITUTIONAL: Well developed/well nourished HEAD: Normocephalic/atraumatic EYES: EOMI/PERRL ENMT: Mucous membranes moist, uvula mildine NECK: supple no meningeal signs SPINE:entire spine nontender CV: S1/S2 noted, no murmurs/rubs/gallops noted LUNGS: Lungs are clear to auscultation bilaterally, no apparent distress ABDOMEN: soft, nontender, no rebound or guarding GU:no cva tenderness NEURO: Pt is awake/alert, moves all extremitiesx4 EXTREMITIES: pulses normal, full ROM SKIN: warm, color normal PSYCH: no  abnormalities of mood noted  ED Course  Procedures  CRITICAL CARE Performed by: Sharyon Cable Total critical care time: 32 Critical care time was exclusive of separately billable procedures and treating other patients. Critical care was necessary to treat or prevent imminent or life-threatening deterioration. Critical care was time spent personally by me on the following activities: development of treatment plan with patient and/or surrogate as well as nursing, discussions with consultants, evaluation of patient's response to treatment, examination of patient, obtaining history from patient or surrogate, ordering and performing treatments and interventions, ordering and review of laboratory studies, ordering and review of radiographic studies, pulse oximetry and re-evaluation of patient's condition. PATIENT WITH MYOCARDITIS AND ELEVATED TROPONIN, WILL NEED ADMISSION TO STEPDOWN  12:18 PM Pt with mild CP since last night ?pericarditis on EKG.  Troponin elevated.  Will consult cardiology Initial history/physician did not indicate ACS 12:37 PM Pt stabilized D/w dr bensimhon, he reviewed EKG and feels likely myopericarditis Dr bensimhon recommend IV toradol 30mg  Pt agreeable for transfer  Labs Review Labs Reviewed  TROPONIN I - Abnormal; Notable for the following:    Troponin I 9.05 (*)    All other components within normal limits  CBC - Abnormal; Notable for the following:    WBC 12.4 (*)    All other components within normal limits  BASIC METABOLIC PANEL - Abnormal; Notable for the following:    Glucose, Bld 115 (*)    GFR calc non Af Amer 71 (*)    GFR calc Af Amer 82 (*)    All other components within normal limits     EKG Interpretation   Date/Time:  Friday December 26 2013 11:22:05 EDT Ventricular Rate:  76 PR Interval:  170 QRS Duration: 88 QT Interval:  392 QTC Calculation: 441 R Axis:   60 Text Interpretation:  Normal sinus rhythm Possible Left atrial  enlargement  Acute pericarditis Nonspecific ST abnormality Abnormal ECG Confirmed by  Christy Gentles  MD, Elenore Rota ([id number]) on 12/26/2013 11:26:00 AM      MDM   Final diagnoses:  Acute myopericarditis    Nursing notes including past medical history and social history reviewed and considered in documentation xrays reviewed and considered Labs/vital reviewed and considered Previous records reviewed and considered     Sharyon Cable, MD 12/26/13 1238

## 2013-12-26 NOTE — ED Notes (Signed)
Pt. Reports he was seen at Urgent care on Monday and was told he has strep tht.  Pt. Was being treated with Z pak per Pt. And was going to be treated with more as of today after seeing Dr. Ardath Sax PA.  But Pt. Reports he was having chest pain last night with headache and PA sent Pt. To ED for further testing.  In ED Pt. Was noted to have increased troponin poss. Pericarditis.

## 2013-12-26 NOTE — ED Notes (Signed)
Chest pain that started last night.  Treated for strep this week and followed up today with PMD; sent to ED for further evaluation of CP and EKG changes.

## 2013-12-26 NOTE — Assessment & Plan Note (Signed)
Faint and some constant since last night and some risk factors. Some st elevation possible on ekg. Called downstairs. Updated charge nurse. Pt sent down stairs for further evaluation in ED. Advised on importance. Pt agreed he would go.

## 2013-12-26 NOTE — Progress Notes (Signed)
Pre visit review using our clinic review tool, if applicable. No additional management support is needed unless otherwise documented below in the visit note. 

## 2013-12-26 NOTE — Progress Notes (Signed)
Subjective:    Patient ID: Gregory Klein, male    DOB: 1965-08-30, 48 y.o.   MRN: [id number]  HPI  Pt states on Monday he went to urgent care [facility]. He was told  strep. He took azithromycin. He went back to work on Thursday. Pt st was resolved on wed. Pt strep test was positive at [facility].  Pt went back to work yesterday. He felt warm at work and felt sweaty. Pt felt tired briefly this morning. But feel normal now. No nausea or vomiting. No current myaglias.   Pt states last night states very vague very low  discomfort to left chest. This symptoms never went away. And is still present now. No shortness of breath and no wheezing. No sharp pain. Slight dull pain.  No jaw pain, and no arm pain. Grandmom-MI. Pt has htn. Only slight ldl.   Past Medical History  Diagnosis Date  . 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 Neck pain     local injections x 2, las summer 2011 (Dr.Vortek)  . GERD (gastroesophageal reflux disease)   . Hypertension   . Cirrhosis     per CT, saw GI 11-2010, Bx was Rx , pt cancelled    . Hemorrhoids   . Strep pharyngitis     History   Social History  . Marital Status: Single    Spouse Name: N/A    Number of Children: 0  . Years of Education: N/A   Occupational History  . Education officer, museum, works at a high school    . back to school, masters school administration    Social History Main Topics  . Smoking status: Never Smoker   . Smokeless tobacco: Never Used  . Alcohol Use: Yes     Comment: beer rarely  . Drug Use: No  . Sexual Activity: Not on file   Other Topics Concern  . Not on file   Social History Narrative        Past Surgical History  Procedure Laterality Date  . Inguinal hernia repair    . Knee arthroscopy  6-15    Dr Mardelle Matte    Family History  Problem Relation Age of Onset  . Coronary artery disease Maternal Grandmother   . Hypertension Mother   . Stroke Mother   . Diabetes Neg Hx   . Colon cancer Neg Hx   . Prostate  cancer Maternal Grandfather 3  . Hypertension Father   . Hypertension Maternal Grandmother   . Breast cancer Paternal Grandmother   . Pancreatic cancer Paternal Aunt     Allergies  Allergen Reactions  . Amoxicillin Rash    Current Outpatient Prescriptions on File Prior to Visit  Medication Sig Dispense Refill  . etodolac (LODINE) 200 MG capsule Take 200 mg by mouth every 8 (eight) hours. Per Dr Lynann Bologna      . losartan (COZAAR) 50 MG tablet take 1 tablet by mouth once daily  90 tablet  2  . polyethylene glycol powder (MIRALAX) powder Take 17 g by mouth 2 (two) times a week.        . psyllium (METAMUCIL) 58.6 % powder Take 1 packet by mouth daily.        . valACYclovir (VALTREX) 1000 MG tablet take 2 tablets by mouth every morning and 2 every evening FOR EACH OUTBREAK.  30 tablet  6   No current facility-administered medications on file prior to visit.  BP 109/76  Pulse 82  Temp(Src) 98.2 F (36.8 C) (Oral)  Ht 5' 10.5" (1.791 m)  Wt 182 lb 9.6 oz (82.827 kg)  BMI 25.82 kg/m2  SpO2 100%     Review of Systems  Constitutional: Negative for fever, chills and fatigue.  HENT: Negative for congestion, ear discharge, ear pain, facial swelling, hearing loss, mouth sores, nosebleeds, rhinorrhea, sinus pressure, sore throat, tinnitus and voice change.        Pt notes still feels mild swollen throat and submandibular nodes.  Respiratory: Negative for cough, chest tightness, shortness of breath and wheezing.   Cardiovascular: Positive for chest pain. Negative for palpitations.       Mild constant left side since last night.  Gastrointestinal: Negative.   Genitourinary: Negative.   Musculoskeletal: Negative.   Neurological: Negative for dizziness, syncope, facial asymmetry, speech difficulty, weakness, numbness and headaches.       Objective:   Physical Exam  General- NAD Neck- from. No jvd. Lungs- CTA Heart- RRR Anterior thorax- no pain on palpation. No pain on  inspiration. HEENT- moderate tonsil hypertrophy. Moderate to large submandibular lymph nodes. No sinus pressure. Tm normal Moderate to large tonsils. Moderate to large submandibular nodes. No costochandral junction tenderness. No pain deep breathing.         Assessment & Plan:

## 2013-12-26 NOTE — ED Notes (Signed)
Pt here from PCP, had EKG, here for cardiac enzymes. Chest pain started last night and is in left chest "just there a little."

## 2013-12-26 NOTE — Progress Notes (Signed)
ANTICOAGULATION CONSULT NOTE - Initial Consult  Pharmacy Consult for Heparin Indication: chest pain/ACS  Allergies  Allergen Reactions  . Amoxicillin Rash    Patient Measurements: Height: 5\' 11"  (180.3 cm) Weight: 182 lb (82.555 kg) IBW/kg (Calculated) : 75.3 Heparin Dosing Weight: 82.5 kg  Vital Signs: Temp: 97.8 F (36.6 C) (10/30 2000) Temp Source: Oral (10/30 2000) BP: 111/81 mmHg (10/30 1730) Pulse Rate: 84 (10/30 1730)  Labs:  Recent Labs  12/26/13 1125 12/26/13 1455  HGB 13.7  --   HCT 41.1  --   PLT 246  --   CREATININE 1.20  --   TROPONINI 9.05* >20.00*    Estimated Creatinine Clearance: 81.9 ml/min (by C-G formula based on Cr of 1.2).   Medical History: Past Medical History  Diagnosis Date  . 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 Neck pain     local injections x 2, las summer 2011 (Dr.Vortek)  . GERD (gastroesophageal reflux disease)   . Hypertension   . Cirrhosis     per CT, saw GI 11-2010, Bx was Rx , pt cancelled    . Hemorrhoids   . Strep pharyngitis     Medications:  Prescriptions prior to admission  Medication Sig Dispense Refill  . ibuprofen (ADVIL,MOTRIN) 200 MG tablet Take 400 mg by mouth 2 (two) times daily as needed (pain).      Marland Kitchen losartan (COZAAR) 50 MG tablet Take 50 mg by mouth daily.      . naproxen sodium (ALEVE) 220 MG tablet Take 440 mg by mouth daily as needed (pain).      . valACYclovir (VALTREX) 1000 MG tablet Take 2,000 mg by mouth See admin instructions. Take 2 tablets  (2 gms) twice daily for 2 days as needed for outbreaks      . azithromycin (ZITHROMAX) 250 MG tablet Take 250 mg by mouth daily. For 4 more days - finished 5 day course 12/26/13       Scheduled:  . [START ON 12/27/2013] aspirin EC  81 mg Oral Daily   Infusions:    Assessment: 48yo male presented to [facility] with CP and was transferred to [facility]. Pharmacy was consulted to dose heparin for chest pain/ACS. Pt's CBC is wnl.  Goal of Therapy:   Heparin level 0.3-0.7 units/ml Monitor platelets by anticoagulation protocol: Yes   Plan:  Give 4000 units bolus x 1 Start heparin infusion at 1000 units/hr Check anti-Xa level in 6 hours and daily while on heparin Continue to monitor H&H and platelets  Andrey Cota. Diona Foley, PharmD Clinical Pharmacist Pager [phone] 12/26/2013,8:45 PM

## 2013-12-26 NOTE — ED Provider Notes (Signed)
Pt awaits step-down bed.  Still symptom free. 2nd troponin >20.  Repeat EKG without changes, and no Q waves.   Tanna Furry, MD 12/26/13 1700

## 2013-12-27 DIAGNOSIS — R079 Chest pain, unspecified: Secondary | ICD-10-CM

## 2013-12-27 LAB — CBC
HCT: 39.7 % (ref 39.0–52.0)
Hemoglobin: 13.2 g/dL (ref 13.0–17.0)
MCH: 27.1 pg (ref 26.0–34.0)
MCHC: 33.2 g/dL (ref 30.0–36.0)
MCV: 81.5 fL (ref 78.0–100.0)
PLATELETS: 254 10*3/uL (ref 150–400)
RBC: 4.87 MIL/uL (ref 4.22–5.81)
RDW: 14 % (ref 11.5–15.5)
WBC: 10.2 10*3/uL (ref 4.0–10.5)

## 2013-12-27 LAB — TSH: TSH: 0.95 u[IU]/mL (ref 0.350–4.500)

## 2013-12-27 LAB — MRSA PCR SCREENING: MRSA by PCR: NEGATIVE

## 2013-12-27 LAB — HEPARIN LEVEL (UNFRACTIONATED): Heparin Unfractionated: 0.39 IU/mL (ref 0.30–0.70)

## 2013-12-27 LAB — TROPONIN I
Troponin I: 13.91 ng/mL (ref <0.30)
Troponin I: 10.83 ng/mL (ref <0.30)

## 2013-12-27 MED ORDER — PNEUMOCOCCAL VAC POLYVALENT 25 MCG/0.5ML IJ INJ
0.5000 mL | INJECTION | INTRAMUSCULAR | Status: AC
  Administered 2013-12-28: 0.5 mL via INTRAMUSCULAR
  Filled 2013-12-27: qty 0.5

## 2013-12-27 MED ORDER — INFLUENZA VAC SPLIT QUAD 0.5 ML IM SUSY
0.5000 mL | PREFILLED_SYRINGE | INTRAMUSCULAR | Status: AC
  Administered 2013-12-28: 0.5 mL via INTRAMUSCULAR
  Filled 2013-12-27: qty 0.5

## 2013-12-27 NOTE — Progress Notes (Signed)
Patient Name: Gregory Klein Date of Encounter: 12/27/2013     Principal Problem:   Acute myopericarditis Active Problems:   Hx of type B viral hepatitis   HTN (hypertension)   Cirrhosis   Elevated troponin    SUBJECTIVE  The patient is not having any chest discomfort.  He remains on IV heparin.  Troponins remain high.  Vital signs are stable and he is in no distress.  CURRENT MEDS . aspirin EC  81 mg Oral Daily    OBJECTIVE  Filed Vitals:   12/26/13 2100 12/27/13 0000 12/27/13 0700 12/27/13 0800  BP: 130/100  91/72 116/83  Pulse: 75  92 89  Temp:  97.9 F (36.6 C) 98.4 F (36.9 C)   TempSrc:  Oral Oral   Resp: 22  17 13   Height:      Weight:      SpO2: 98%  98% 99%    Intake/Output Summary (Last 24 hours) at 12/27/13 1127 Last data filed at 12/27/13 0900  Gross per 24 hour  Intake    120 ml  Output    300 ml  Net   -180 ml   Filed Weights   12/26/13 1113  Weight: 182 lb (82.555 kg)    PHYSICAL EXAM  General: Pleasant, NAD. Neuro: Alert and oriented X 3. Moves all extremities spontaneously. Psych: Normal affect. HEENT:  Normal  Neck: Supple without bruits or JVD. Lungs:  Resp regular and unlabored, CTA. Heart: RRR no s3, s4, or murmurs. Abdomen: Soft, non-tender, non-distended, BS + x 4.  Extremities: No clubbing, cyanosis or edema. DP/PT/Radials 2+ and equal bilaterally.  Accessory Clinical Findings  CBC  Recent Labs  12/26/13 1125 12/27/13 0830  WBC 12.4* 10.2  HGB 13.7 13.2  HCT 41.1 39.7  MCV 81.5 81.5  PLT 246 706   Basic Metabolic Panel  Recent Labs  12/26/13 1125 12/26/13 2119  NA 140 142  K 3.7 3.6*  CL 98 101  CO2 29 30  GLUCOSE 115* 95  BUN 13 11  CREATININE 1.20 1.10  CALCIUM 9.4 8.9   Liver Function Tests  Recent Labs  12/26/13 2119  AST 97*  ALT 38  ALKPHOS 65  BILITOT 0.9  PROT 7.1  ALBUMIN 3.1*   No results found for this basename: LIPASE, AMYLASE,  in the last 72 hours Cardiac Enzymes  Recent  Labs  12/26/13 1455 12/26/13 2119 12/27/13 0830  TROPONINI >20.00* 13.71* 13.91*   BNP No components found with this basename: POCBNP,  D-Dimer No results found for this basename: DDIMER,  in the last 72 hours Hemoglobin A1C No results found for this basename: HGBA1C,  in the last 72 hours Fasting Lipid Panel No results found for this basename: CHOL, HDL, LDLCALC, TRIG, CHOLHDL, LDLDIRECT,  in the last 72 hours Thyroid Function Tests  Recent Labs  12/27/13 0307  TSH 0.950    TELE  Normal sinus rhythm  ECG  Normal sinus rhythm.  No change in widespread ST elevation suggesting acute pericarditis.  Radiology/Studies  Dg Chest 2 View  12/26/2013   CLINICAL DATA:  Chest tightness, lightheaded.  EXAM: CHEST  2 VIEW  COMPARISON:  None.  FINDINGS: Heart and mediastinal contours are within normal limits. No focal opacities or effusions. No acute bony abnormality. Mild rightward scoliosis in the thoracic spine.  IMPRESSION: No active cardiopulmonary disease.   Electronically Signed   By: Rolm Baptise M.D.   On: 12/26/2013 12:52    ASSESSMENT AND PLAN 1.  Probable acute inflammatory myopericarditis with significant elevation of troponins 2.  Prior history of hypertension 3.  Prior history of type B viral hepatitis and cirrhosis  Plan: Continue IV heparin.  Two-dimensional echocardiogram pending.  Probable left heart catheterization on Monday. Signed, Darlin Coco MD

## 2013-12-27 NOTE — Progress Notes (Signed)
ANTICOAGULATION CONSULT NOTE - follow up [place] for Heparin Indication: chest pain/ACS  Allergies  Allergen Reactions  . Amoxicillin Rash    Patient Measurements: Height: 5\' 11"  (180.3 cm) Weight: 182 lb (82.555 kg) IBW/kg (Calculated) : 75.3 Heparin Dosing Weight: 82.5 kg  Vital Signs: Temp: 98.4 F (36.9 C) (10/31 0700) Temp Source: Oral (10/31 0700) BP: 116/83 mmHg (10/31 0800) Pulse Rate: 89 (10/31 0800)  Labs:  Recent Labs  12/26/13 1125 12/26/13 1455 12/26/13 2119 12/27/13 0830  HGB 13.7  --   --  13.2  HCT 41.1  --   --  39.7  PLT 246  --   --  254  LABPROT  --   --  14.9  --   INR  --   --  1.15  --   HEPARINUNFRC  --   --   --  0.39  CREATININE 1.20  --  1.10  --   TROPONINI 9.05* >20.00* 13.71* 13.91*    Estimated Creatinine Clearance: 89.4 ml/min (by C-G formula based on Cr of 1.1).   Medical History: Past Medical History  Diagnosis Date  . 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 Neck pain     local injections x 2, las summer 2011 (Dr.Vortek)  . GERD (gastroesophageal reflux disease)   . Hypertension   . Cirrhosis     per CT, saw GI 11-2010, Bx was Rx , pt cancelled    . Hemorrhoids   . Strep pharyngitis     Medications:  Prescriptions prior to admission  Medication Sig Dispense Refill  . ibuprofen (ADVIL,MOTRIN) 200 MG tablet Take 400 mg by mouth 2 (two) times daily as needed (pain).      Marland Kitchen losartan (COZAAR) 50 MG tablet Take 50 mg by mouth daily.      . naproxen sodium (ALEVE) 220 MG tablet Take 440 mg by mouth daily as needed (pain).      . valACYclovir (VALTREX) 1000 MG tablet Take 2,000 mg by mouth See admin instructions. Take 2 tablets  (2 gms) twice daily for 2 days as needed for outbreaks      . azithromycin (ZITHROMAX) 250 MG tablet Take 250 mg by mouth daily. For 4 more days - finished 5 day course 12/26/13       Scheduled:  . aspirin EC  81 mg Oral Daily   Infusions:  . heparin 1,000 Units/hr  (12/26/13 2312)    Assessment: 48yo male presented to [facility] with CP and was transferred to [facility]. Tp elevated > 20 now trending down.  Heparin drip 1000 uts/hr HL 0.39 cbc stable , no bleeding.    Goal of Therapy:  Heparin level 0.3-0.7 units/ml Monitor platelets by anticoagulation protocol: Yes   Plan:   Continue heparin drip 1000 uts/hr Daily HL, cbc  Bonnita Nasuti Pharm.D. CPP, BCPS Clinical Pharmacist [phone] 12/27/2013 11:17 AM

## 2013-12-28 ENCOUNTER — Other Ambulatory Visit: Payer: Self-pay

## 2013-12-28 LAB — HEPARIN LEVEL (UNFRACTIONATED)
HEPARIN UNFRACTIONATED: 0.72 [IU]/mL — AB (ref 0.30–0.70)
Heparin Unfractionated: 0.22 IU/mL — ABNORMAL LOW (ref 0.30–0.70)

## 2013-12-28 LAB — BASIC METABOLIC PANEL
Anion gap: 8 (ref 5–15)
BUN: 9 mg/dL (ref 6–23)
CHLORIDE: 104 meq/L (ref 96–112)
CO2: 30 mEq/L (ref 19–32)
Calcium: 8.6 mg/dL (ref 8.4–10.5)
Creatinine, Ser: 1.08 mg/dL (ref 0.50–1.35)
GFR calc Af Amer: >90 mL/min (ref >90)
GFR calc non Af Amer: 81 mL/min — ABNORMAL LOW (ref >90)
Glucose, Bld: 94 mg/dL (ref 70–99)
POTASSIUM: 3.5 meq/L — AB (ref 3.7–5.3)
Sodium: 142 mEq/L (ref 137–147)

## 2013-12-28 LAB — CBC
HCT: 37.1 % — ABNORMAL LOW (ref 39.0–52.0)
Hemoglobin: 12.3 g/dL — ABNORMAL LOW (ref 13.0–17.0)
MCH: 27 pg (ref 26.0–34.0)
MCHC: 33.2 g/dL (ref 30.0–36.0)
MCV: 81.5 fL (ref 78.0–100.0)
PLATELETS: 276 10*3/uL (ref 150–400)
RBC: 4.55 MIL/uL (ref 4.22–5.81)
RDW: 14 % (ref 11.5–15.5)
WBC: 8.8 10*3/uL (ref 4.0–10.5)

## 2013-12-28 MED ORDER — HEPARIN BOLUS VIA INFUSION
2000.0000 [IU] | Freq: Once | INTRAVENOUS | Status: AC
  Administered 2013-12-28: 2000 [IU] via INTRAVENOUS
  Filled 2013-12-28: qty 2000

## 2013-12-28 MED ORDER — SODIUM CHLORIDE 0.9 % IJ SOLN: 3.0000 mL | INTRAMUSCULAR | Status: DC | PRN

## 2013-12-28 MED ORDER — SODIUM CHLORIDE 0.9 % IV SOLN: 250.0000 mL | INTRAVENOUS | Status: DC | PRN

## 2013-12-28 MED ORDER — SODIUM CHLORIDE 0.9 % IJ SOLN
3.0000 mL | Freq: Two times a day (BID) | INTRAMUSCULAR | Status: DC
  Administered 2013-12-28: 3 mL via INTRAVENOUS

## 2013-12-28 MED ORDER — ASPIRIN 81 MG PO CHEW: 81.0000 mg | CHEWABLE_TABLET | ORAL | Status: DC

## 2013-12-28 MED ORDER — SODIUM CHLORIDE 0.9 % IJ SOLN: 3.0000 mL | Freq: Two times a day (BID) | INTRAMUSCULAR | Status: DC

## 2013-12-28 MED ORDER — ASPIRIN 81 MG PO CHEW
81.0000 mg | CHEWABLE_TABLET | ORAL | Status: AC
  Administered 2013-12-29: 81 mg via ORAL
  Filled 2013-12-28: qty 1

## 2013-12-28 MED ORDER — POTASSIUM CHLORIDE CRYS ER 20 MEQ PO TBCR
40.0000 meq | EXTENDED_RELEASE_TABLET | Freq: Once | ORAL | Status: AC
  Administered 2013-12-28: 40 meq via ORAL
  Filled 2013-12-28: qty 2

## 2013-12-28 NOTE — Progress Notes (Addendum)
Patient Name: Gregory Klein Date of Encounter: 12/28/2013     Principal Problem:   Acute myopericarditis Active Problems:   Hx of type B viral hepatitis   HTN (hypertension)   Cirrhosis   Elevated troponin    SUBJECTIVE  The patient denies any chest discomfort or shortness of breath.  Troponins are starting to trend downward.  EKG shows increased lateral T-wave inversion.  And persistent ST segment elevation consistent with myopericarditis.  The echocardiogram showed segmental wall motion abnormalities  CURRENT MEDS . aspirin EC  81 mg Oral Daily  . potassium chloride  40 mEq Oral Once    OBJECTIVE  Filed Vitals:   12/28/13 0400 12/28/13 0600 12/28/13 0736 12/28/13 1134  BP: 99/57 106/72 103/80 104/76  Pulse: 87 90 78 90  Temp: 98.3 F (36.8 C)  98.5 F (36.9 C) 98.4 F (36.9 C)  TempSrc: Oral  Oral Oral  Resp: 15 14 20 18   Height:      Weight:      SpO2: 97% 96% 97% 98%    Intake/Output Summary (Last 24 hours) at 12/28/13 1154 Last data filed at 12/28/13 0600  Gross per 24 hour  Intake 760.83 ml  Output    350 ml  Net 410.83 ml   Filed Weights   12/26/13 1113  Weight: 182 lb (82.555 kg)    PHYSICAL EXAM  General: Pleasant, NAD. Neuro: Alert and oriented X 3. Moves all extremities spontaneously. Psych: Normal affect. HEENT:  Normal  Neck: Supple without bruits or JVD. Lungs:  Resp regular and unlabored, CTA. Heart: RRR no s3, s4, or murmurs. Abdomen: Soft, non-tender, non-distended, BS + x 4.  Extremities: No clubbing, cyanosis or edema. DP/PT/Radials 2+ and equal bilaterally.  Accessory Clinical Findings  CBC  Recent Labs  12/27/13 0830 12/28/13 0332  WBC 10.2 8.8  HGB 13.2 12.3*  HCT 39.7 37.1*  MCV 81.5 81.5  PLT 254 924   Basic Metabolic Panel  Recent Labs  12/26/13 2119 12/28/13 0332  NA 142 142  K 3.6* 3.5*  CL 101 104  CO2 30 30  GLUCOSE 95 94  BUN 11 9  CREATININE 1.10 1.08  CALCIUM 8.9 8.6   Liver Function  Tests  Recent Labs  12/26/13 2119  AST 97*  ALT 38  ALKPHOS 65  BILITOT 0.9  PROT 7.1  ALBUMIN 3.1*   No results for input(s): LIPASE, AMYLASE in the last 72 hours. Cardiac Enzymes  Recent Labs  12/26/13 2119 12/27/13 0830 12/27/13 1420  TROPONINI 13.71* 13.91* 10.83*   BNP Invalid input(s): POCBNP D-Dimer No results for input(s): DDIMER in the last 72 hours. Hemoglobin A1C No results for input(s): HGBA1C in the last 72 hours. Fasting Lipid Panel No results for input(s): CHOL, HDL, LDLCALC, TRIG, CHOLHDL, LDLDIRECT in the last 72 hours. Thyroid Function Tests  Recent Labs  12/27/13 0307  TSH 0.950    TELE  Normal sinus rhythm  ECG  Two-dimensional echocardiogram on 12/26/13: - Left ventricle: The cavity size was normal. Systolic function was normal. The estimated ejection fraction was in the range of 50% to 55%. Hypokinesis of the apicalanteroseptal and anterior myocardium. Left ventricular diastolic function parameters were normal. There was no pericardial effusion Radiology/Studies  Dg Chest 2 View  12/26/2013   CLINICAL DATA:  Chest tightness, lightheaded.  EXAM: CHEST  2 VIEW  COMPARISON:  None.  FINDINGS: Heart and mediastinal contours are within normal limits. No focal opacities or effusions. No acute bony abnormality. Mild  rightward scoliosis in the thoracic spine.  IMPRESSION: No active cardiopulmonary disease.   Electronically Signed   By: Rolm Baptise M.D.   On: 12/26/2013 12:52    ASSESSMENT AND PLAN 1. Probable acute inflammatory myopericarditis with significant elevation of troponins and wall motion abnormalities on echo 2. Prior history of hypertension 3. Prior history of type B viral hepatitis and cirrhosis  Plan: Continue IV heparin.  left heart catheterization on Monday. Unable to put precath orders in Epic today  Signed, Darlin Coco MD

## 2013-12-28 NOTE — Progress Notes (Signed)
EKG CRITICAL VALUE     12 lead EKG performed.  Critical value noted.  Monia Sabal, RN notified.   Deosha Werden L, CCT 12/28/2013 8:26 AM

## 2013-12-28 NOTE — Plan of Care (Signed)
Problem: Consults Goal: Cardiac Cath Patient Education (See Patient Education module for education specifics.) Outcome: Completed/Met Date Met:  12/28/13 Goal: Skin Care Protocol Initiated - if Braden Score 18 or less If consults are not indicated, leave blank or document N/A Outcome: Not Applicable Date Met:  20/80/22 Goal: Tobacco Cessation referral if indicated Outcome: Not Applicable Date Met:  33/61/22 Goal: Nutrition Consult-if indicated Outcome: Not Applicable Date Met:  44/97/53 Goal: Diabetes Guidelines if Diabetic/Glucose > 140 If diabetic or lab glucose is > 140 mg/dl - Initiate Diabetes/Hyperglycemia Guidelines & Document Interventions  Outcome: Not Applicable Date Met:  00/51/10

## 2013-12-28 NOTE — Plan of Care (Signed)
Problem: Phase I Progression Outcomes Goal: Aspirin unless contraindicated Outcome: Completed/Met Date Met:  12/28/13 Goal: Hemodynamically stable Outcome: Completed/Met Date Met:  12/28/13

## 2013-12-28 NOTE — Plan of Care (Signed)
Problem: Consults Goal: Skin Care Protocol Initiated - if Braden Score 18 or less If consults are not indicated, leave blank or document N/A  Outcome: Not Applicable Date Met:  72/41/95 Goal: Tobacco Cessation referral if indicated Outcome: Not Applicable Date Met:  42/48/14 Goal: Diabetes Guidelines if Diabetic/Glucose > 140 If diabetic or lab glucose is > 140 mg/dl - Initiate Diabetes/Hyperglycemia Guidelines & Document Interventions  Outcome: Not Applicable Date Met:  43/92/65

## 2013-12-28 NOTE — Progress Notes (Signed)
ANTICOAGULATION CONSULT NOTE - follow up [place] for Heparin Indication: chest pain/ACS  Allergies  Allergen Reactions  . Amoxicillin Rash    Patient Measurements: Height: 5\' 11"  (180.3 cm) Weight: 182 lb (82.555 kg) IBW/kg (Calculated) : 75.3 Heparin Dosing Weight: 82.5 kg  Vital Signs: Temp: 98.4 F (36.9 C) (11/01 1134) Temp Source: Oral (11/01 1134) BP: 104/76 mmHg (11/01 1134) Pulse Rate: 90 (11/01 1134)  Labs:  Recent Labs  12/26/13 1125  12/26/13 2119 12/27/13 0830 12/27/13 1420 12/28/13 0332 12/28/13 1246  HGB 13.7  --   --  13.2  --  12.3*  --   HCT 41.1  --   --  39.7  --  37.1*  --   PLT 246  --   --  254  --  276  --   LABPROT  --   --  14.9  --   --   --   --   INR  --   --  1.15  --   --   --   --   HEPARINUNFRC  --   --   --  0.39  --  0.22* 0.72*  CREATININE 1.20  --  1.10  --   --  1.08  --   TROPONINI 9.05*  < > 13.71* 13.91* 10.83*  --   --   < > = values in this interval not displayed.  Estimated Creatinine Clearance: 91 mL/min (by C-G formula based on Cr of 1.08).   Medical History: Past Medical History  Diagnosis Date  . 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 Neck pain     local injections x 2, las summer 2011 (Dr.Vortek)  . GERD (gastroesophageal reflux disease)   . Hypertension   . Cirrhosis     per CT, saw GI 11-2010, Bx was Rx , pt cancelled    . Hemorrhoids   . Strep pharyngitis     Medications:  Prescriptions prior to admission  Medication Sig Dispense Refill Last Dose  . ibuprofen (ADVIL,MOTRIN) 200 MG tablet Take 400 mg by mouth 2 (two) times daily as needed (pain).   12/25/2013 at Unknown time  . losartan (COZAAR) 50 MG tablet Take 50 mg by mouth daily.   12/26/2013 at Unknown time  . naproxen sodium (ALEVE) 220 MG tablet Take 440 mg by mouth daily as needed (pain).   12/25/2013 at Unknown time  . valACYclovir (VALTREX) 1000 MG tablet Take 2,000 mg by mouth See admin instructions. Take 2  tablets  (2 gms) twice daily for 2 days as needed for outbreaks   12/24/2013  . azithromycin (ZITHROMAX) 250 MG tablet Take 250 mg by mouth daily. For 4 more days - finished 5 day course 12/26/13   not yet started   Scheduled:  . aspirin EC  81 mg Oral Daily  . potassium chloride  40 mEq Oral Once   Infusions:  . heparin 1,200 Units/hr (12/28/13 0535)    Assessment: 48yo male presented to [facility] with CP and was transferred to [facility]. Tp elevated > 20 now trending down.  Heparin drip 1000 uts/hr HL 0.22> heparin was bolused and rate increased recheck HL 0.72 - could be still some bolus effect - will only decrease rate slightly.   cbc stable , no bleeding.    Goal of Therapy:  Heparin level 0.3-0.7 units/ml Monitor platelets by anticoagulation protocol: Yes   Plan:  Decrease  heparin drip 1100 uts/hr  Daily HL, cbc  Bonnita Nasuti Pharm.D. CPP, BCPS Clinical Pharmacist [phone] 12/28/2013 3:14 PM

## 2013-12-28 NOTE — Progress Notes (Signed)
[place] for Heparin Indication: chest pain/ACS  Allergies  Allergen Reactions  . Amoxicillin Rash    Patient Measurements: Height: 5\' 11"  (180.3 cm) Weight: 182 lb (82.555 kg) IBW/kg (Calculated) : 75.3 Heparin Dosing Weight: 82.5 kg  Vital Signs: Temp: 98.3 F (36.8 C) (11/01 0400) Temp Source: Oral (11/01 0400) BP: 99/57 mmHg (11/01 0400) Pulse Rate: 87 (11/01 0400)  Labs:  Recent Labs  12/26/13 1125  12/26/13 2119 12/27/13 0830 12/27/13 1420 12/28/13 0332  HGB 13.7  --   --  13.2  --  12.3*  HCT 41.1  --   --  39.7  --  37.1*  PLT 246  --   --  254  --  276  LABPROT  --   --  14.9  --   --   --   INR  --   --  1.15  --   --   --   HEPARINUNFRC  --   --   --  0.39  --  0.22*  CREATININE 1.20  --  1.10  --   --  1.08  TROPONINI 9.05*  < > 13.71* 13.91* 10.83*  --   < > = values in this interval not displayed.  Estimated Creatinine Clearance: 91 mL/min (by C-G formula based on Cr of 1.08).  Assessment: 48 y.o. male with chest pain for heparin  Goal of Therapy:  Heparin level 0.3-0.7 units/ml Monitor platelets by anticoagulation protocol: Yes   Plan: Heparin 2000 units IV bolus, then increase heparin 1200 units/hr Check heparin level in 6 hours.  Phillis Knack, PharmD, BCPS   12/28/2013 5:28 AM

## 2013-12-29 ENCOUNTER — Inpatient Hospital Stay (HOSPITAL_COMMUNITY): Payer: BC Managed Care – PPO

## 2013-12-29 ENCOUNTER — Encounter (HOSPITAL_COMMUNITY)
Admission: EM | Disposition: A | Discharge status: Home / Self Care | Payer: Self-pay | Source: Home / Self Care | Attending: Cardiovascular Disease

## 2013-12-29 DIAGNOSIS — I514 Myocarditis, unspecified: Secondary | ICD-10-CM

## 2013-12-29 DIAGNOSIS — R079 Chest pain, unspecified: Secondary | ICD-10-CM

## 2013-12-29 DIAGNOSIS — R7989 Other specified abnormal findings of blood chemistry: Secondary | ICD-10-CM

## 2013-12-29 HISTORY — PX: LEFT HEART CATHETERIZATION WITH CORONARY ANGIOGRAM: SHX5451

## 2013-12-29 LAB — CBC
HEMATOCRIT: 37.9 % — AB (ref 39.0–52.0)
Hemoglobin: 12.5 g/dL — ABNORMAL LOW (ref 13.0–17.0)
MCH: 27.1 pg (ref 26.0–34.0)
MCHC: 33 g/dL (ref 30.0–36.0)
MCV: 82 fL (ref 78.0–100.0)
Platelets: 317 10*3/uL (ref 150–400)
RBC: 4.62 MIL/uL (ref 4.22–5.81)
RDW: 14.2 % (ref 11.5–15.5)
WBC: 8.6 10*3/uL (ref 4.0–10.5)

## 2013-12-29 LAB — BASIC METABOLIC PANEL
Anion gap: 11 (ref 5–15)
BUN: 10 mg/dL (ref 6–23)
CALCIUM: 8.8 mg/dL (ref 8.4–10.5)
CO2: 27 mEq/L (ref 19–32)
CREATININE: 1.11 mg/dL (ref 0.50–1.35)
Chloride: 104 mEq/L (ref 96–112)
GFR calc Af Amer: >90 mL/min (ref >90)
GFR calc non Af Amer: 78 mL/min — ABNORMAL LOW (ref >90)
Glucose, Bld: 97 mg/dL (ref 70–99)
Potassium: 4.1 mEq/L (ref 3.7–5.3)
Sodium: 142 mEq/L (ref 137–147)

## 2013-12-29 LAB — HEPARIN LEVEL (UNFRACTIONATED): Heparin Unfractionated: 0.66 IU/mL (ref 0.30–0.70)

## 2013-12-29 SURGERY — LEFT HEART CATHETERIZATION WITH CORONARY ANGIOGRAM
Anesthesia: LOCAL

## 2013-12-29 MED ORDER — GADOBENATE DIMEGLUMINE 529 MG/ML IV SOLN
30.0000 mL | Freq: Once | INTRAVENOUS | Status: AC | PRN
  Administered 2013-12-29: 30 mL via INTRAVENOUS

## 2013-12-29 MED ORDER — LIDOCAINE HCL (PF) 1 % IJ SOLN
INTRAMUSCULAR | Status: AC
  Filled 2013-12-29: qty 30

## 2013-12-29 MED ORDER — VERAPAMIL HCL 2.5 MG/ML IV SOLN
INTRAVENOUS | Status: AC
  Filled 2013-12-29: qty 2

## 2013-12-29 MED ORDER — MIDAZOLAM HCL 2 MG/2ML IJ SOLN
INTRAMUSCULAR | Status: AC
  Filled 2013-12-29: qty 2

## 2013-12-29 MED ORDER — SODIUM CHLORIDE 0.9 % IJ SOLN: 3.0000 mL | INTRAMUSCULAR | Status: DC | PRN

## 2013-12-29 MED ORDER — OXYCODONE-ACETAMINOPHEN 5-325 MG PO TABS: 1.0000 | ORAL_TABLET | ORAL | Status: DC | PRN

## 2013-12-29 MED ORDER — SODIUM CHLORIDE 0.9 % IJ SOLN
3.0000 mL | Freq: Two times a day (BID) | INTRAMUSCULAR | Status: DC
  Administered 2013-12-29: 3 mL via INTRAVENOUS

## 2013-12-29 MED ORDER — HEPARIN (PORCINE) IN NACL 2-0.9 UNIT/ML-% IJ SOLN
INTRAMUSCULAR | Status: AC
Start: 2013-12-29 — End: 2013-12-29
  Filled 2013-12-29: qty 1000

## 2013-12-29 MED ORDER — ACETAMINOPHEN 325 MG PO TABS
650.0000 mg | ORAL_TABLET | ORAL | Status: DC | PRN
  Administered 2013-12-30: 650 mg via ORAL

## 2013-12-29 MED ORDER — NITROGLYCERIN 1 MG/10 ML FOR IR/CATH LAB
INTRA_ARTERIAL | Status: AC
  Filled 2013-12-29: qty 10

## 2013-12-29 MED ORDER — HEPARIN SODIUM (PORCINE) 1000 UNIT/ML IJ SOLN
INTRAMUSCULAR | Status: AC
  Filled 2013-12-29: qty 1

## 2013-12-29 MED ORDER — FENTANYL CITRATE 0.05 MG/ML IJ SOLN
INTRAMUSCULAR | Status: AC
  Filled 2013-12-29: qty 2

## 2013-12-29 MED ORDER — SODIUM CHLORIDE 0.9 % IV SOLN
1.0000 mL/kg/h | INTRAVENOUS | Status: DC
  Administered 2013-12-29 – 2013-12-30 (×3): 1 mL/kg/h via INTRAVENOUS

## 2013-12-29 MED ORDER — SODIUM CHLORIDE 0.9 % IV SOLN: 250.0000 mL | INTRAVENOUS | Status: DC | PRN

## 2013-12-29 MED ORDER — DIAZEPAM 2 MG PO TABS: 2.0000 mg | ORAL_TABLET | ORAL | Status: DC | PRN

## 2013-12-29 MED ORDER — ONDANSETRON HCL 4 MG/2ML IJ SOLN: 4.0000 mg | Freq: Four times a day (QID) | INTRAMUSCULAR | Status: DC | PRN

## 2013-12-29 NOTE — CV Procedure (Signed)
    Cardiac Catheterization Procedure Note  Name: Gregory Klein MRN: [id number] DOB: Jul 12, 1965  Procedure: Left Heart Cath, Selective Coronary Angiography, LV angiography  Indication:  Elevated troponin , Chest pain   Procedural Details: The right wrist was prepped, draped, and anesthetized with 1% lidocaine. Using the modified Seldinger technique, a 5/6 French Slender sheath was introduced into the right radial artery. 3 mg of verapamil was administered through the sheath, weight-based unfractionated heparin was administered intravenously. Standard Judkins catheters were used for selective coronary angiography and left ventriculography. Catheter exchanges were performed over an exchange length guidewire. There were no immediate procedural complications. A TR band was used for radial hemostasis at the completion of the procedure.  The patient was transferred to the post catheterization recovery area for further monitoring.  Procedural Findings: Hemodynamics: AO 123/ 78 LV  125/14  Coronary angiography: Coronary dominance: right  Left mainstem:  Normal  Left anterior descending (LAD):  Normal   D1: normal  D2: normal  Left circumflex (LCx):  Normal   OM1: normal  Right coronary artery (RCA):  Dominant and normal  Left ventriculography: Left ventricular systolic function mildly decreased Mild apical hypokinesis no MR EF 50%  Impression:  Findings consistent with myopericarditis  Will order MRI to assess degree and risk of VT    Jenkins Rouge MD, [facility] 12/29/2013, 10:44 AM

## 2013-12-29 NOTE — Interval H&P Note (Signed)
History and Physical Interval Note:  12/29/2013 8:26 AM  Gregory Klein  has presented today for surgery, with the diagnosis of unstable angina  The various methods of treatment have been discussed with the patient and family. After consideration of risks, benefits and other options for treatment, the patient has consented to  Procedure(s): LEFT HEART CATHETERIZATION WITH CORONARY ANGIOGRAM (N/A) as a surgical intervention .  The patient's history has been reviewed, patient examined, no change in status, stable for surgery.  I have reviewed the patient's chart and labs.  Questions were answered to the patient's satisfaction.     Jenkins Rouge

## 2013-12-29 NOTE — Progress Notes (Signed)
   SUBJECTIVE:  No CP.  Happy about cath results.  OBJECTIVE:   Vitals:   Filed Vitals:   12/29/13 0400 12/29/13 0803 12/29/13 0841 12/29/13 1033  BP: 100/76 116/85  124/92  Pulse:  74 90   Temp:  98.2 F (36.8 C)    TempSrc:  Oral    Resp: 16     Height:      Weight:      SpO2: 99%      I&O's:   Intake/Output Summary (Last 24 hours) at 12/29/13 1102 Last data filed at 12/29/13 0800  Gross per 24 hour  Intake 671.55 ml  Output    500 ml  Net 171.55 ml   TELEMETRY: Reviewed telemetry pt in NSR:     PHYSICAL EXAM General: Well developed, well nourished, in no acute distress Head:   Normal cephalic and atramatic  Lungs:   Clear bilaterally to auscultation. Heart:  HRRR S1 S2   Abdomen: abdomen soft and non-tender Msk:  Back normal,  Normal strength and tone for age. Extremities:   No edema.  2+ right radial pulse; no bleeding; vasc band in place Neuro: Alert and oriented. Psych:  Normal affect, responds appropriately   LABS: Basic Metabolic Panel:  Recent Labs  12/28/13 0332 12/29/13 0300  NA 142 142  K 3.5* 4.1  CL 104 104  CO2 30 27  GLUCOSE 94 97  BUN 9 10  CREATININE 1.08 1.11  CALCIUM 8.6 8.8   Liver Function Tests:  Recent Labs  12/26/13 2119  AST 97*  ALT 38  ALKPHOS 65  BILITOT 0.9  PROT 7.1  ALBUMIN 3.1*   No results for input(s): LIPASE, AMYLASE in the last 72 hours. CBC:  Recent Labs  12/28/13 0332 12/29/13 0300  WBC 8.8 8.6  HGB 12.3* 12.5*  HCT 37.1* 37.9*  MCV 81.5 82.0  PLT 276 317   Cardiac Enzymes:  Recent Labs  12/26/13 2119 12/27/13 0830 12/27/13 1420  TROPONINI 13.71* 13.91* 10.83*   BNP: Invalid input(s): POCBNP D-Dimer: No results for input(s): DDIMER in the last 72 hours. Hemoglobin A1C: No results for input(s): HGBA1C in the last 72 hours. Fasting Lipid Panel: No results for input(s): CHOL, HDL, LDLCALC, TRIG, CHOLHDL, LDLDIRECT in the last 72 hours. Thyroid Function Tests:  Recent Labs  12/27/13 0307  TSH 0.950   Anemia Panel: No results for input(s): VITAMINB12, FOLATE, FERRITIN, TIBC, IRON, RETICCTPCT in the last 72 hours. Coag Panel:   Lab Results  Component Value Date   INR 1.15 12/26/2013   INR 1.1* 10/14/2010    RADIOLOGY: Dg Chest 2 View  12/26/2013   CLINICAL DATA:  Chest tightness, lightheaded.  EXAM: CHEST  2 VIEW  COMPARISON:  None.  FINDINGS: Heart and mediastinal contours are within normal limits. No focal opacities or effusions. No acute bony abnormality. Mild rightward scoliosis in the thoracic spine.  IMPRESSION: No active cardiopulmonary disease.   Electronically Signed   By: Rolm Baptise M.D.   On: 12/26/2013 12:52      ASSESSMENT: Kathyrn Lass:  Myopericarditis:  No CAD.  Planning cardiac MRI per Dr. Johnsie Cancel.  Transfer to tele.  Jettie Booze., MD  12/29/2013  11:02 AM

## 2013-12-29 NOTE — Progress Notes (Addendum)
Pt transferred to room 2W35; report given to receiving nurse, Varney Biles, RN. Pt denies complaints or pain at this time. VSS at time of transfer. Belongings (including phone, black duffle bag with clothes, and cell phone charger), meds, and chart transferred with pt.  Pt parents aware and accompanied pt during transfer.  Right radial band site observed with receiving RN. Slight ooze noted at procedure site at time of transfer. Plan to leave remaining 2cc of air and monitor closely.  Loni Muse, RN

## 2013-12-29 NOTE — Progress Notes (Signed)
TR band removed. Kathleen Argue S 1:54 PM

## 2013-12-30 ENCOUNTER — Encounter (HOSPITAL_COMMUNITY): Payer: Self-pay | Admitting: Physician Assistant

## 2013-12-30 DIAGNOSIS — J020 Streptococcal pharyngitis: Secondary | ICD-10-CM | POA: Diagnosis present

## 2013-12-30 DIAGNOSIS — I10 Essential (primary) hypertension: Secondary | ICD-10-CM

## 2013-12-30 DIAGNOSIS — B1910 Unspecified viral hepatitis B without hepatic coma: Secondary | ICD-10-CM | POA: Diagnosis present

## 2013-12-30 DIAGNOSIS — I409 Acute myocarditis, unspecified: Secondary | ICD-10-CM

## 2013-12-30 DIAGNOSIS — I514 Myocarditis, unspecified: Secondary | ICD-10-CM | POA: Diagnosis present

## 2013-12-30 DIAGNOSIS — K219 Gastro-esophageal reflux disease without esophagitis: Secondary | ICD-10-CM | POA: Diagnosis present

## 2013-12-30 LAB — DRUGS OF ABUSE SCREEN W/O ALC, ROUTINE URINE
Amphetamine Screen, Ur: NEGATIVE
Barbiturate Quant, Ur: NEGATIVE
Benzodiazepines.: NEGATIVE
Cocaine Metabolites: NEGATIVE
Creatinine,U: 416.4 mg/dL
Marijuana Metabolite: NEGATIVE
Methadone: NEGATIVE
Opiate Screen, Urine: NEGATIVE
Phencyclidine (PCP): NEGATIVE
Propoxyphene: NEGATIVE

## 2013-12-30 LAB — BASIC METABOLIC PANEL
ANION GAP: 10 (ref 5–15)
BUN: 8 mg/dL (ref 6–23)
CALCIUM: 8.7 mg/dL (ref 8.4–10.5)
CO2: 28 mEq/L (ref 19–32)
Chloride: 108 mEq/L (ref 96–112)
Creatinine, Ser: 1.12 mg/dL (ref 0.50–1.35)
GFR calc Af Amer: 89 mL/min — ABNORMAL LOW (ref >90)
GFR, EST NON AFRICAN AMERICAN: 77 mL/min — AB (ref >90)
GLUCOSE: 101 mg/dL — AB (ref 70–99)
Potassium: 4 mEq/L (ref 3.7–5.3)
SODIUM: 146 meq/L (ref 137–147)

## 2013-12-30 LAB — CBC
HCT: 36.6 % — ABNORMAL LOW (ref 39.0–52.0)
Hemoglobin: 11.6 g/dL — ABNORMAL LOW (ref 13.0–17.0)
MCH: 26.1 pg (ref 26.0–34.0)
MCHC: 31.7 g/dL (ref 30.0–36.0)
MCV: 82.4 fL (ref 78.0–100.0)
PLATELETS: 340 10*3/uL (ref 150–400)
RBC: 4.44 MIL/uL (ref 4.22–5.81)
RDW: 14.2 % (ref 11.5–15.5)
WBC: 7.8 10*3/uL (ref 4.0–10.5)

## 2013-12-30 LAB — ANTI-DNASE B ANTIBODY: Anti-DNAse-B: <95 U/mL (ref <301)

## 2013-12-30 MED ORDER — CARVEDILOL 3.125 MG PO TABS
3.1250 mg | ORAL_TABLET | Freq: Two times a day (BID) | ORAL | Status: DC
  Administered 2013-12-30: 3.125 mg via ORAL
  Filled 2013-12-30 (×2): qty 1

## 2013-12-30 MED ORDER — CARVEDILOL 3.125 MG PO TABS: 3.1250 mg | ORAL_TABLET | Freq: Two times a day (BID) | ORAL | Status: DC

## 2013-12-30 NOTE — Plan of Care (Signed)
Problem: Phase I Progression Outcomes Goal: Pain controlled with appropriate interventions Outcome: Completed/Met Date Met:  12/30/13     

## 2013-12-30 NOTE — Plan of Care (Signed)
Problem: Phase I Progression Outcomes Goal: Voiding-avoid urinary catheter unless indicated Outcome: Completed/Met Date Met:  12/30/13

## 2013-12-30 NOTE — Discharge Instructions (Signed)
Myocarditis Myocarditis is a swelling (inflammation) of the heart muscle (myocardium). When the heart becomes inflamed, it cannot pump as well. Severe cases of myocarditis can cause heart failure.  CAUSES It is usually caused by a viral infection, although other causes are possible.  SYMPTOMS Mild cases of myocarditis may not have symptoms. If symptoms do occur, they may include:  Chest pain.  Shortness of breath.  Fast or abnormal heart rhythms.  Fatigue.  Fluid retention or swelling in the feet or legs.  Fever.  Body aches.  Fainting. DIAGNOSIS  Myocarditis can be hard to diagnose because it can mimic other diseases. Myocarditis may be suspected if the above symptoms have appeared after a recent infection. A physical exam and other tests may be used to confirm the diagnosis. Some of these tests are:   Blood tests to check for signs of infection or heart damage.  Electrocardiography that shows your heart's electrical patterns and rhythms.  A chest X-ray exam to look at your heart and lungs.  Echocardiography or other imaging tests to look at how well your heart is working.  Your health care provider also may recommend a heart (cardiac) catheterization. In this test, a flexible tube (catheter) is inserted into a blood vessel then threaded into the heart. A special instrument can then remove tiny samples of heart muscle tissue (biopsy). The samples are sent to a lab for analysis. TREATMENT  Treatment of myocarditis is aimed at treating the underlying cause and may involve:   Heart medicine such as beta blockers or angiotensin-converting enzyme (ACE) inhibitors. These help strengthen the heart and help it beat more regularly.  Diuretic medicine. Extra fluid in the body can make the heart work harder. Diuretic medicine can help get rid of the extra fluid.  Steroid medicine. In some cases of myocarditis, steroid medicine is used to reduce swelling. MAKE SURE YOU:   Understand  these discharge instructions.  Will monitor your condition.  Take all medicines as directed by your health care provider.  Will seek immediate medical care if necessary. Document Released: 01/05/2005 Document Revised: 02/18/2013 Document Reviewed: 07/08/2012 [facility] Patient Information 2015 [place], [place]. This information is not intended to replace advice given to you by your health care provider. Make sure you discuss any questions you have with your health care provider.      Radial Site Care Refer to this sheet in the next few weeks. These instructions provide you with information on caring for yourself after your procedure. Your caregiver may also give you more specific instructions. Your treatment has been planned according to current medical practices, but problems sometimes occur. Call your caregiver if you have any problems or questions after your procedure. HOME CARE INSTRUCTIONS  You may shower the day after the procedure.Remove the bandage (dressing) and gently wash the site with plain soap and water.Gently pat the site dry.   Do not apply powder or lotion to the site.   Do not submerge the affected site in water for 3 to 5 days.   Inspect the site at least twice daily.   Do not flex or bend the affected arm for 24 hours.   No lifting over 5 pounds (2.3 kg) for 5 days after your procedure.   Do not drive home if you are discharged the same day of the procedure. Have someone else drive you.   You may drive 24 hours after the procedure unless otherwise instructed by your caregiver.  What to expect:  Any bruising will  usually fade within 1 to 2 weeks.   Blood that collects in the tissue (hematoma) may be painful to the touch. It should usually decrease in size and tenderness within 1 to 2 weeks.  SEEK IMMEDIATE MEDICAL CARE IF:  You have unusual pain at the radial site.   You have redness, warmth, swelling, or pain at the radial site.   You have drainage  (other than a small amount of blood on the dressing).   You have chills.   You have a fever or persistent symptoms for more than 72 hours.   You have a fever and your symptoms suddenly get worse.   Your arm becomes pale, cool, tingly, or numb.   You have heavy bleeding from the site. Hold pressure on the site.

## 2013-12-30 NOTE — Progress Notes (Signed)
Pt discharged home, parents at bedside Discharge instructions given & reviewed Eduction discussed  IV dc'd  Tele dc'd  Pt discharged via wheelchair with volunteer service, all pt belongs at side.  Gregory Klein

## 2013-12-30 NOTE — Discharge Summary (Signed)
Discharge Summary   Patient ID: Gregory Klein MRN: [id number], DOB/AGE: [birth year] 46 y.o. Admit date: 12/26/2013 D/C date:     12/30/2013  Primary Cardiologist: Dr. Sallyanne Kuster (new)  Principal Problem:   Acute myocarditis Active Problems:   Cirrhosis   Strep pharyngitis   Hypertension   GERD (gastroesophageal reflux disease)   Hepatitis B infection   Pain in the chest    Admission Dates: 12/26/13-12/30/13 Discharge Diagnosis: acute myocarditis s/p LHC with normal coronaries and cardiac MRI with finding c/w acute myocarditis  HPI: Gregory Klein is a 48 y.o. male with a history of Hep B, cirrhosis and HTN who presented to [facility] on 12/26/13 with chest pain and found to have diffuse ST elevation with elevated troponin and was transferred to [facility] for suspect myocarditis.   He was treated a week earlier for strep throat and was referred to [facility] by his PCP when he developed chest pain.    Hospital Course  Acute inflammatory myopericarditis with significant elevation of troponins and wall motion abnormalities on echo -- Troponin >20 on 12/26/13 now trending downwards --> 13.9-->10.8 -- LHC on 12/29/13 w/ no CAD and findings c/w myopericarditis. MRI ordered to assess degree and risk of VT --Cardiac MRI on 12/29/13 w/: 1) Mild LVE EF 49% 2) Normal cardiac valves 3) No pericardial effusion 4) Delayed gadolinium uptake in the inferobasal wall, apical myocardium and pericardium In the setting of elevated troponin, ECG changes and no hemodynamically significant lesions on cath this would be consistent with myocarditis  -- Will add a low dose of a BB (hold home losartan due to normal BP at this time) - he will have more possible anti-arrhythmic benefit from b-blocker, as ventricular arrhythmias are common with this. -- Data suggests that anti-inflammatories may actually worsen this. No clear role for aspirin -- Will need follow-up outpatient echo in 3-6 months to look for LV recovery.   Hx  of HTN- BPs have been well controlled on no medications while inpatient -- He takes Losartan 50mg  po qd at home. This can be added at outpatient follow up if his BP can tolerate.   Prior history of type B viral hepatitis and cirrhosis  The patient has had an uncomplicated hospital course and is recovering well. The radial catheter site is stable. He has been seen by Dr. Debara Pickett today and deemed ready for discharge home. All follow-up appointments have been scheduled. Discharge medications are listed below.   Discharge Vitals: Blood pressure 114/82, pulse 76, temperature 98 F (36.7 C), temperature source Oral, resp. rate 20, height 5\' 11"  (1.803 m), weight 182 lb (82.555 kg), SpO2 100 %.  Labs: Lab Results  Component Value Date   WBC 7.8 12/30/2013   HGB 11.6* 12/30/2013   HCT 36.6* 12/30/2013   MCV 82.4 12/30/2013   PLT 340 12/30/2013    Recent Labs Lab 12/26/13 2119  12/30/13 0359  NA 142  < > 146  K 3.6*  < > 4.0  CL 101  < > 108  CO2 30  < > 28  BUN 11  < > 8  CREATININE 1.10  < > 1.12  CALCIUM 8.9  < > 8.7  PROT 7.1  --   --   BILITOT 0.9  --   --   ALKPHOS 65  --   --   ALT 38  --   --   AST 97*  --   --   GLUCOSE 95  < > 101*  < > =  values in this interval not displayed.  Recent Labs  12/27/13 1420  TROPONINI 10.83*   Lab Results  Component Value Date   CHOL 182 04/30/2013   HDL 54.90 04/30/2013   LDLCALC 122* 04/30/2013   TRIG 26.0 04/30/2013     Diagnostic Studies/Procedures   Dg Chest 2 View  12/26/2013   CLINICAL DATA:  Chest tightness, lightheaded.  EXAM: CHEST  2 VIEW  COMPARISON:  None.  FINDINGS: Heart and mediastinal contours are within normal limits. No focal opacities or effusions. No acute bony abnormality. Mild rightward scoliosis in the thoracic spine.  IMPRESSION: No active cardiopulmonary disease.   Electronically Signed   By: Rolm Baptise M.D.   On: 12/26/2013 12:52   Mr Card Morphology Wo/w Cm  12/29/2013   CLINICAL DATA:  Myocarditis   EXAM: CARDIAC MRI  TECHNIQUE: The patient was scanned on a 1.5 Tesla GE magnet. A dedicated cardiac coil was used. Functional imaging was done using Fiesta sequences. 2,3, and 4 chamber views were done to assess for RWMA's. Modified Simpson's rule using a short axis stack was used to calculate an ejection fraction on a dedicated work Conservation officer, nature. The patient received 30 cc of Multihance. After 10 minutes inversion recovery sequences were used to assess for infiltration and scar tissue.  CONTRAST:  30 cc Multihance  FINDINGS: There was mild LVE. No HOCM or LVH. The ascending aorta was normal. The LA/RA/RV were normal in size and function. The mitral, tricuspid and aortic valves were normal There was no ASD/VSD and no significant pericardial effusion. The quantitative EF was 49% with distal septal and apical hypokinesis. Delayed enhancement images showed spotty uptake in the inferobasal myocardium, anterior apical myocardium and pericardium  IMPRESSION: 1) Mild LVE EF 49%  2) Normal cardiac valves  3) No pericardial effusion  4) Delayed gadolinium uptake in the inferobasal wall, apical myocardium and pericardium In the setting of elevated troponin, ECG changes and no hemodynamically significant lesions on cath this would be consistent with myocarditis  Jenkins Rouge   Electronically Signed   By: Jenkins Rouge M.D.   On: 12/29/2013 17:57      Cardiac Catheterization Procedure Note Name: Gregory Klein MRN: [id number] DOB: [birth year] Procedure: Left Heart Cath, Selective Coronary Angiography, LV angiography Indication: Elevated troponin , Chest pain Procedural Details: The right wrist was prepped, draped, and anesthetized with 1% lidocaine. Using the modified Seldinger technique, a 5/6 French Slender sheath was introduced into the right radial artery. 3 mg of verapamil was administered through the sheath, weight-based unfractionated heparin was administered  intravenously. Standard Judkins catheters were used for selective coronary angiography and left ventriculography. Catheter exchanges were performed over an exchange length guidewire. There were no immediate procedural complications. A TR band was used for radial hemostasis at the completion of the procedure. The patient was transferred to the post catheterization recovery area for further monitoring Procedural Findings: Hemodynamics: AO 123/ 78 LV 125/14 Coronary angiography: Coronary dominance: right Left mainstem: Normal Left anterior descending (LAD): Normal D1: normal D2: normal Left circumflex (LCx): Normal OM1: normal Right coronary artery (RCA): Dominant and normal Left ventriculography: Left ventricular systolic function mildly decreased Mild apical hypokinesis no MR EF 50% Impression: Findings consistent with myopericarditis Will order MRI to assess degree and risk of VT    2D ECHO Study Date: 12/26/2013 LV EF: 50% -  55% Study Conclusions - Left ventricle: The cavity size was normal. Systolic function was normal. The estimated ejection fraction was in  the range of 50% to 55%. Hypokinesis of the apicalanteroseptal and anterior myocardium. Left ventricular diastolic function parameters were normal.  Discharge Medications     Medication List    STOP taking these medications        ALEVE 220 MG tablet  Generic drug:  naproxen sodium     azithromycin 250 MG tablet  Commonly known as:  ZITHROMAX     ibuprofen 200 MG tablet  Commonly known as:  ADVIL,MOTRIN     losartan 50 MG tablet  Commonly known as:  COZAAR      TAKE these medications        carvedilol 3.125 MG tablet  Commonly known as:  COREG  Take 1 tablet (3.125 mg total) by mouth 2 (two) times daily with a meal.     VALTREX 1000 MG tablet  Generic drug:  valACYclovir  Take 2,000 mg by mouth See admin instructions. Take 2 tablets  (2 gms) twice  daily for 2 days as needed for outbreaks        Disposition   The patient will be discharged in stable condition to home.  Follow-up Information    Follow up with [facility] R, NP On 01/08/2014.   Specialty:  Cardiology   Why:  3:30pm   Contact information:   [address] [address] [phone]         Duration of Discharge Encounter: Greater than 30 minutes including physician and PA time.  Mable Fill R PA-C 12/30/2013, 12:31 PM

## 2013-12-30 NOTE — Progress Notes (Signed)
Patient Name: Gregory Klein Date of Encounter: 12/30/2013     Principal Problem:   Acute myopericarditis Active Problems:   Hx of type B viral hepatitis   HTN (hypertension)   Cirrhosis   Elevated troponin    SUBJECTIVE  No CP, feeling well. Sometime feels like he needs to take a deep breath in.   CURRENT MEDS . aspirin EC  81 mg Oral Daily  . sodium chloride  3 mL Intravenous Q12H    OBJECTIVE  Filed Vitals:   12/29/13 1420 12/29/13 2018 12/30/13 0355 12/30/13 0926  BP: 106/76 108/72 97/61 119/68  Pulse: 80 90 83 70  Temp:  98.8 F (37.1 C) 97.9 F (36.6 C) 98 F (36.7 C)  TempSrc:  Oral Oral Oral  Resp: 18 18 18 20   Height:      Weight:      SpO2: 100% 100% 100% 100%    Intake/Output Summary (Last 24 hours) at 12/30/13 1059 Last data filed at 12/30/13 0926  Gross per 24 hour  Intake 1756.09 ml  Output    500 ml  Net 1256.09 ml   Filed Weights   12/26/13 1113  Weight: 182 lb (82.555 kg)    PHYSICAL EXAM  General: Pleasant, NAD. Neuro: Alert and oriented X 3. Moves all extremities spontaneously. Psych: Normal affect. HEENT:  Normal  Neck: Supple without bruits or JVD. Lungs:  Resp regular and unlabored, CTA. Heart: RRR no s3, s4, or murmurs. Abdomen: Soft, non-tender, non-distended, BS + x 4.  Extremities: No clubbing, cyanosis or edema. DP/PT/Radials 2+ and equal bilaterally.  Accessory Clinical Findings  CBC  Recent Labs  12/29/13 0300 12/30/13 0359  WBC 8.6 7.8  HGB 12.5* 11.6*  HCT 37.9* 36.6*  MCV 82.0 82.4  PLT 317 381   Basic Metabolic Panel  Recent Labs  12/29/13 0300 12/30/13 0359  NA 142 146  K 4.1 4.0  CL 104 108  CO2 27 28  GLUCOSE 97 101*  BUN 10 8  CREATININE 1.11 1.12  CALCIUM 8.8 8.7   Liver Function Tests No results for input(s): AST, ALT, ALKPHOS, BILITOT, PROT, ALBUMIN in the last 72 hours. No results for input(s): LIPASE, AMYLASE in the last 72 hours. Cardiac Enzymes  Recent Labs  12/27/13 1420    TROPONINI 10.83*    TELE  NSR  Radiology/Studies  Dg Chest 2 View  12/26/2013   CLINICAL DATA:  Chest tightness, lightheaded.  EXAM: CHEST  2 VIEW  COMPARISON:  None.  FINDINGS: Heart and mediastinal contours are within normal limits. No focal opacities or effusions. No acute bony abnormality. Mild rightward scoliosis in the thoracic spine.  IMPRESSION: No active cardiopulmonary disease.   Electronically Signed   By: Rolm Baptise M.D.   On: 12/26/2013 12:52   Mr Card Morphology Wo/w Cm  12/29/2013   CLINICAL DATA:  Myocarditis  EXAM: CARDIAC MRI  TECHNIQUE: The patient was scanned on a 1.5 Tesla GE magnet. A dedicated cardiac coil was used. Functional imaging was done using Fiesta sequences. 2,3, and 4 chamber views were done to assess for RWMA's. Modified Simpson's rule using a short axis stack was used to calculate an ejection fraction on a dedicated work Conservation officer, nature. The patient received 30 cc of Multihance. After 10 minutes inversion recovery sequences were used to assess for infiltration and scar tissue.  CONTRAST:  30 cc Multihance  FINDINGS: There was mild LVE. No HOCM or LVH. The ascending aorta was normal. The LA/RA/RV  were normal in size and function. The mitral, tricuspid and aortic valves were normal There was no ASD/VSD and no significant pericardial effusion. The quantitative EF was 49% with distal septal and apical hypokinesis. Delayed enhancement images showed spotty uptake in the inferobasal myocardium, anterior apical myocardium and pericardium  IMPRESSION: 1) Mild LVE EF 49%  2) Normal cardiac valves  3) No pericardial effusion  4) Delayed gadolinium uptake in the inferobasal wall, apical myocardium and pericardium In the setting of elevated troponin, ECG changes and no hemodynamically significant lesions on cath this would be consistent with myocarditis       Cardiac Catheterization Procedure Note Name: Gregory Klein MRN: [id number] DOB:  [birth year] Procedure: Left Heart Cath, Selective Coronary Angiography, LV angiography Indication: Elevated troponin , Chest pain Procedural Details: The right wrist was prepped, draped, and anesthetized with 1% lidocaine. Using the modified Seldinger technique, a 5/6 French Slender sheath was introduced into the right radial artery. 3 mg of verapamil was administered through the sheath, weight-based unfractionated heparin was administered intravenously. Standard Judkins catheters were used for selective coronary angiography and left ventriculography. Catheter exchanges were performed over an exchange length guidewire. There were no immediate procedural complications. A TR band was used for radial hemostasis at the completion of the procedure. The patient was transferred to the post catheterization recovery area for further monitoring Procedural Findings: Hemodynamics: AO 123/ 78 LV 125/14 Coronary angiography: Coronary dominance: right Left mainstem: Normal Left anterior descending (LAD): Normal D1: normal D2: normal Left circumflex (LCx): Normal OM1: normal Right coronary artery (RCA): Dominant and normal Left ventriculography: Left ventricular systolic function mildly decreased Mild apical hypokinesis no MR EF 50% Impression: Findings consistent with myopericarditis Will order MRI to assess degree and risk of VT     ASSESSMENT AND PLAN Gregory Klein is a 48 y.o. male with a history of Hep B, cirrhosis and HTN who presented to [facility] on 12/26/13 with chest pain. He was treated a week earlier for strep throat  Probable acute inflammatory myopericarditis with significant elevation of troponins and wall motion abnormalities on echo -- Troponin >20 on 12/26/13 now trending downwards --> 13.9-->10.8 -- LHC on 11/02 w/ no CAD and findings c/w myopericarditis. MRI ordered to assess degree and risk of VT --  Cardiac MRI on  12/29/13 w/: 1) Mild LVE EF 49%  2) Normal cardiac valves  3) No pericardial effusion  4) Delayed gadolinium uptake in the inferobasal wall, apical myocardium and pericardium In the setting of elevated troponin, ECG changes and no hemodynamically significant lesions on cath this would be consistent with myocarditis   -- Will add a low dose of a BB. -- No indication for ASA  Hx of HTN- BPs have been well controlled on no medications -- He takes Losartan 50mg  po qd at home.   Prior history of type B viral hepatitis and cirrhosis  Signed, Eileen Stanford PA-C  Pager [phone]

## 2014-01-06 ENCOUNTER — Telehealth: Payer: Self-pay | Admitting: Cardiology

## 2014-01-06 NOTE — Telephone Encounter (Signed)
Gregory Klein is calling because he has a question pertaining to what is going with him. He is having some pain on his left side and lower back ion which he has been having for the last two days . Please Calls   Thanks

## 2014-01-06 NOTE — Telephone Encounter (Signed)
Spoke to patient He states he is concerned  -Developing pain on left side- he wanted to know if his kidney is involved-no problem with urination, no blood in urine.   No cough no congestion, fatigued  Blood  Pressure 103/71 yesterday , 120/64 today-  He is concerned about his diagnosis of mild carditis- informed patient to discuss with Cecilie Kicks NP.

## 2014-01-08 ENCOUNTER — Encounter: Payer: Self-pay | Admitting: Cardiology

## 2014-01-08 ENCOUNTER — Ambulatory Visit (INDEPENDENT_AMBULATORY_CARE_PROVIDER_SITE_OTHER): Payer: BC Managed Care – PPO | Admitting: Cardiology

## 2014-01-08 VITALS — BP 125/88 | HR 78 | Ht 71.0 in | Wt 181.0 lb

## 2014-01-08 DIAGNOSIS — I10 Essential (primary) hypertension: Secondary | ICD-10-CM

## 2014-01-08 DIAGNOSIS — K7460 Unspecified cirrhosis of liver: Secondary | ICD-10-CM

## 2014-01-08 DIAGNOSIS — J020 Streptococcal pharyngitis: Secondary | ICD-10-CM

## 2014-01-08 DIAGNOSIS — G4730 Sleep apnea, unspecified: Secondary | ICD-10-CM

## 2014-01-08 DIAGNOSIS — I514 Myocarditis, unspecified: Secondary | ICD-10-CM

## 2014-01-08 DIAGNOSIS — I409 Acute myocarditis, unspecified: Secondary | ICD-10-CM

## 2014-01-08 DIAGNOSIS — R0683 Snoring: Secondary | ICD-10-CM

## 2014-01-08 NOTE — Telephone Encounter (Signed)
He has appt today, do not believe side pain related to his heart.

## 2014-01-08 NOTE — Progress Notes (Signed)
01/11/2014   PCP: Kathlene November, MD   Chief Complaint  Patient presents with  . Hospitalization Follow-up    for myocarditis    Primary Cardiologist:Dr. Bertrum Sol   HPI:  48 y.o. male with a history of Hep B, cirrhosis and HTN who presented to [facility] on 12/26/13 with chest pain and found to have diffuse ST elevation with elevated troponin and was transferred to [facility] for suspect myocarditis.  He was treated a week earlier for strep throat and was referred to [facility] by his PCP when he developed chest pain.  He underwent cardiac cath with patent coronary arteries.  His troponin Peaked at > 20.  Cardiac MRI on 12/29/13 w/: 1) Mild LVE EF 49% 2) Normal cardiac valves 3) No pericardial effusion 4) Delayed gadolinium uptake in the inferobasal wall, apical myocardium and pericardium In the setting of elevated troponin, ECG changes and no hemodynamically significant lesions on cath this would be consistent with myocarditis BB was added.  Data suggests that anti-inflammatories may actually worsen this. No clear role for aspirin -- Will need follow-up outpatient echo in 3-6 months to look for LV recovery  He is here today for follow up.  Only complaint of one episode after walking around [employer] he became hot and weak.  He had not walked much prior to this.  Otherwise no chest pain and no SOB.  He is asking about sleep study he was borderline in the past.  Will order sleep study.  He is to follow up with GI for type B viral hepatitis and cirrhosis.  Allergies  Allergen Reactions  . Amoxicillin Rash    Current Outpatient Prescriptions  Medication Sig Dispense Refill  . carvedilol (COREG) 3.125 MG tablet Take 1 tablet (3.125 mg total) by mouth 2 (two) times daily with a meal. 60 tablet 11  . valACYclovir (VALTREX) 1000 MG tablet Take 2,000 mg by mouth See admin instructions. Take 2 tablets  (2 gms) twice daily for 2 days as needed for outbreaks     No current facility-administered  medications for this visit.    Past Medical History  Diagnosis Date  . 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 Neck pain     local injections x 2, las summer 2011 (Dr.Vortek)  . GERD (gastroesophageal reflux disease)   . Hypertension   . Cirrhosis     per CT, saw GI 11-2010, Bx was Rx   . Hemorrhoids   . Strep pharyngitis   . Acute myocarditis     a. in the setting of a recent strep throat inefection 12/26/13. LHC with no CAD and cadiac MR with finding c/w myocarditis    Past Surgical History  Procedure Laterality Date  . Inguinal hernia repair    . Knee arthroscopy  6-15    Dr Mardelle Matte    ZSW:FUXNATF:TD colds or fevers, no weight changes Skin:no rashes or ulcers HEENT:no blurred vision, no congestion CV:see HPI PUL:see HPI GI:no diarrhea constipation or melena, no indigestion GU:no hematuria, no dysuria MS:no joint pain, no claudication Neuro:no syncope, no lightheadedness Endo:no diabetes, no thyroid disease  Wt Readings from Last 3 Encounters:  01/08/14 181 lb (82.101 kg)  12/26/13 182 lb (82.555 kg)  12/26/13 182 lb 9.6 oz (82.827 kg)    PHYSICAL EXAM BP 125/88 mmHg  Pulse 78  Ht 5\' 11"  (1.803 m)  Wt 181 lb (82.101 kg)  BMI 25.26 kg/m2 General:Pleasant  affect, NAD Skin:Warm and dry, brisk capillary refill HEENT:normocephalic, sclera clear, mucus membranes moist Neck:supple, no JVD, no bruits  Heart:S1S2 RRR without murmur, gallup, rub or click Lungs:clear without rales, rhonchi, or wheezes DJM:EQAS, non tender, + BS, do not palpate liver spleen or masses Ext:no lower ext edema, 2+ pedal pulses, 2+ radial pulses Neuro:alert and oriented, MAE, follows commands, + facial symmetry  EKG:SB rate of 57, ST with new flipped t waves in II, III, AVF, V5-V6 post myopericarditis.  ASSESSMENT AND PLAN Acute myocarditis a. in the setting of a recent strep throat inefection 12/26/13. LHC with no CAD and cadiac MR with finding c/w myocarditis Will recheck  sed rate, troponin and CRP.  He will return to work the Monday before Thanksgiving.  He will follow up in 4 weeks with Dr. Jerilynn Mages. [place]   Strep pharyngitis Prior to myocarditis.  Cirrhosis Will follow up with GI

## 2014-01-08 NOTE — Patient Instructions (Addendum)
Your physician has recommended that you have a sleep study. This test records several body functions during sleep, including: brain activity, eye movement, oxygen and carbon dioxide blood levels, heart rate and rhythm, breathing rate and rhythm, the flow of air through your mouth and nose, snoring, body muscle movements, and chest and belly movement.  Your physician recommends that you schedule a follow-up appointment in: 4 Week with Dr Sallyanne Kuster  Your physician recommends that you return for lab work in: Sed Rate anf Troponin and CRP

## 2014-01-09 ENCOUNTER — Telehealth: Payer: Self-pay | Admitting: Cardiology

## 2014-01-09 LAB — TROPONIN I: Troponin I: 0.03 ng/mL (ref <0.06)

## 2014-01-09 LAB — SEDIMENTATION RATE: Sed Rate: 40 mm/h — ABNORMAL HIGH (ref 0–16)

## 2014-01-09 LAB — C-REACTIVE PROTEIN: CRP: 4.2 mg/dL — ABNORMAL HIGH (ref <0.60)

## 2014-01-09 NOTE — Telephone Encounter (Signed)
Returned call to patient Dr.Croitoru advised best approach is observation.Stated he does feel alittle better.Stated he wanted to ask Dr.Croitoru if ok to start walking.Wanted to know if ok to go to [employer] football game next Saturday.Stated will require some walking.Message sent to Dr.Croitoru for advice.

## 2014-01-09 NOTE — Telephone Encounter (Signed)
Returning your call. °

## 2014-01-09 NOTE — Telephone Encounter (Signed)
Returned call to patient Dr.Croitoru advised ok to walk and attend football game.Advised to keep appointment with Dr.Croitoru 02/12/14 at 3:00 pm.

## 2014-01-09 NOTE — Telephone Encounter (Signed)
As long as he is feeling better, best approach is just observation - likely had a viral myocarditis that seems to be resolving. Antiinflammatory therapy likely will not have net benefit. I would like to see him before the year's end

## 2014-01-09 NOTE — Telephone Encounter (Signed)
Yes, walking and attending the game should not be a problem

## 2014-01-09 NOTE — Telephone Encounter (Signed)
Please call,wants to discuss his lab results that he saw on my chart please.

## 2014-01-09 NOTE — Telephone Encounter (Signed)
Returned call to patient no answer.Unable to leave a message mailbox is full. 

## 2014-01-09 NOTE — Telephone Encounter (Signed)
Returned call to patient he was calling about labs done 01/08/14.Cecilie Kicks NP advised will review lab with Dr.Croitoru.Stated he wanted to know what he can do to help get sed rate and crp level down.Advised Dr.Croitoru out of office will send message to him.

## 2014-01-11 NOTE — Assessment & Plan Note (Signed)
a. in the setting of a recent strep throat inefection 12/26/13. LHC with no CAD and cadiac MR with finding c/w myocarditis Will recheck sed rate, troponin and CRP.  He will return to work the Monday before Thanksgiving.  He will follow up in 4 weeks with Dr. Jerilynn Mages. Croitoru

## 2014-01-11 NOTE — Assessment & Plan Note (Signed)
Prior to myocarditis.

## 2014-01-11 NOTE — Assessment & Plan Note (Signed)
Will follow up with GI 

## 2014-01-14 ENCOUNTER — Telehealth: Payer: Self-pay | Admitting: Cardiovascular Disease

## 2014-01-14 NOTE — Telephone Encounter (Signed)
Patient c/oo gas pains - back and side. He has taken gas-x with some relief. He wanted to know if OK to use metamucil for fiber supplementation - advised is OK. He reports having regular BMs so he was advised to not use miralax or other laxatives if his bowel habits are regular. He will see GI MD on Monday. Patient voiced understanding with this plan.

## 2014-01-14 NOTE — Telephone Encounter (Signed)
Pt called in wanting to speak to a nurse about if it ok for him to take Metamucil and Miralax to give him for fiber so that he can have regulars BMs. Please call  Thanks

## 2014-01-15 ENCOUNTER — Telehealth: Payer: Self-pay | Admitting: *Deleted

## 2014-01-15 DIAGNOSIS — I409 Acute myocarditis, unspecified: Secondary | ICD-10-CM

## 2014-01-15 NOTE — Addendum Note (Signed)
Addended by: Diana Eves on: 01/15/2014 01:19 PM   Modules accepted: Orders

## 2014-01-15 NOTE — Telephone Encounter (Signed)
Lab results discussed with patient.  Voiced understanding.  Order placed for Sed Rate in 2 weeks 01/29/14.  Patient will go to [facility] on the first floor.  Has FMLA paper that were e-mailed to him and will drop off at the front desk.

## 2014-01-15 NOTE — Telephone Encounter (Signed)
-----   Message from Isaiah Serge, NP sent at 01/14/2014  8:02 PM EST ----- i believe Dr. Jerilynn Mages. Croitoru addressed.  He told me he did.  But labs are fine.  Let him know Dr. Jerilynn Mages. Croitoru reviewed as well.  Sed rate still not down but we will monitor, recheck in 2 weeks. ----- Message -----    From: Tressa Busman, CMA    Sent: 01/14/2014  11:17 AM      To: Isaiah Serge, NP  What should I tell the patient about his blood work?  Thanks Pamala Hurry

## 2014-01-19 ENCOUNTER — Encounter: Payer: Self-pay | Admitting: Gastroenterology

## 2014-01-19 ENCOUNTER — Other Ambulatory Visit (HOSPITAL_COMMUNITY): Payer: BC Managed Care – PPO

## 2014-01-19 ENCOUNTER — Ambulatory Visit (INDEPENDENT_AMBULATORY_CARE_PROVIDER_SITE_OTHER): Payer: BC Managed Care – PPO | Admitting: Gastroenterology

## 2014-01-19 VITALS — BP 130/78 | HR 72 | Ht 71.0 in | Wt 182.5 lb

## 2014-01-19 DIAGNOSIS — R932 Abnormal findings on diagnostic imaging of liver and biliary tract: Secondary | ICD-10-CM

## 2014-01-19 NOTE — Patient Instructions (Signed)
You have been scheduled for an abdominal ultrasound at [facility] (1st floor of hospital) on 11/27/2015at 8am. Please arrive 15 minutes prior to your appointment for registration. Make certain not to have anything to eat or drink 6 hours prior to your appointment. Should you need to reschedule your appointment, please contact radiology at [phone]. This test typically takes about 30 minutes to perform.   [facility] will contact you to schedule you for the Liver biopsy , If you need to contact them call [phone]

## 2014-01-19 NOTE — Progress Notes (Signed)
    History of Present Illness: This is a 48 year old male, referred by Dr. [name], previously evaluated for suspected cirrhosis in 2012 based on abnormal imaging of the liver. Serologic evaluation was only remarkable for immunity to hepatitis B.Blood work did not suggest cirrhosis. A liver biopsy and tertiary referral were recommended however he never returned to pursue either option. 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 Recent blood work showed a normal prothrombin time and normal LFTs except for mildly elevated AST which may be myocardial.  Review of Systems: Pertinent positive and negative review of systems were noted in the above HPI section. All other review of systems were otherwise negative.  Current Medications, Allergies, Past Medical History, Past Surgical History, Family History and Social History were reviewed in [employer] record.  Physical Exam: General: Well developed , well nourished, no acute distress Head: Normocephalic and atraumatic Eyes:  sclerae anicteric, EOMI Ears: Normal auditory acuity Mouth: No deformity or lesions Neck: Supple, no masses or thyromegaly Lungs: Clear throughout to auscultation Heart: Regular rate and rhythm; no murmurs, rubs or bruits Abdomen: Soft, non tender and non distended. No masses, hepatosplenomegaly or hernias noted. Normal Bowel sounds Musculoskeletal: Symmetrical with no gross deformities  Skin: No lesions on visible extremities Pulses:  Normal pulses noted Extremities: No clubbing, cyanosis, edema or deformities noted Neurological: Alert oriented x 4, grossly nonfocal Cervical Nodes:  No significant cervical adenopathy Inguinal Nodes: No significant inguinal adenopathy Psychological:  Alert and cooperative. Normal mood and affect  Assessment and Recommendations:  1. Abnormal imaging of the liver. Imaging showed features typically noted in cirrhosis. However there are no  physical examination or blood work abnormalities typically noted in cirrhosis. His liver was normal on abdominal ultrasound in 2007. We discussed the risks and benefits of liver biopsy. We also discussed referral to a [facility] for expert hepatologists opinion or observation. After a discussion of these options he prefers to proceed with liver biopsy at this time. Schedule abd [place].

## 2014-01-23 ENCOUNTER — Ambulatory Visit (HOSPITAL_COMMUNITY): Payer: BC Managed Care – PPO

## 2014-01-29 ENCOUNTER — Telehealth: Payer: Self-pay | Admitting: Cardiovascular Disease

## 2014-01-29 ENCOUNTER — Ambulatory Visit (HOSPITAL_COMMUNITY)
Admission: RE | Admit: 2014-01-29 | Discharge: 2014-01-29 | Disposition: A | Discharge status: Home / Self Care | Payer: BC Managed Care – PPO | Source: Ambulatory Visit | Attending: Gastroenterology | Admitting: Gastroenterology

## 2014-01-29 ENCOUNTER — Encounter (HOSPITAL_COMMUNITY): Payer: Self-pay | Admitting: Pharmacy Technician

## 2014-01-29 DIAGNOSIS — R7989 Other specified abnormal findings of blood chemistry: Secondary | ICD-10-CM | POA: Insufficient documentation

## 2014-01-29 DIAGNOSIS — I409 Acute myocarditis, unspecified: Secondary | ICD-10-CM

## 2014-01-29 DIAGNOSIS — R932 Abnormal findings on diagnostic imaging of liver and biliary tract: Secondary | ICD-10-CM | POA: Insufficient documentation

## 2014-01-29 LAB — SEDIMENTATION RATE: Sed Rate: 12 mm/hr (ref 0–16)

## 2014-01-29 NOTE — Telephone Encounter (Signed)
Received a call from [place] with [employer].Advised patient needs a sed rate.

## 2014-01-29 NOTE — Telephone Encounter (Signed)
Patient brought in [place] for disability short term.  The Physicians Statement was short so after speaking with Dr C nurse was decided didn't really need to go to [place].  Forms were given to B Lassiter for Dr C to review and sign

## 2014-01-30 ENCOUNTER — Encounter (HOSPITAL_COMMUNITY): Admission: RE | Payer: Self-pay | Source: Ambulatory Visit

## 2014-01-30 ENCOUNTER — Other Ambulatory Visit: Payer: Self-pay | Admitting: Radiology

## 2014-01-30 ENCOUNTER — Ambulatory Visit (HOSPITAL_COMMUNITY): Admission: RE | Admit: 2014-01-30 | Payer: BC Managed Care – PPO | Source: Ambulatory Visit | Admitting: Neurosurgery

## 2014-01-30 SURGERY — ANTERIOR CERVICAL DECOMPRESSION/DISCECTOMY FUSION 2 LEVELS
Anesthesia: General

## 2014-02-02 ENCOUNTER — Ambulatory Visit (HOSPITAL_COMMUNITY)
Admission: RE | Admit: 2014-02-02 | Discharge: 2014-02-02 | Disposition: A | Discharge status: Home / Self Care | Payer: BC Managed Care – PPO | Source: Ambulatory Visit | Attending: Gastroenterology | Admitting: Gastroenterology

## 2014-02-02 DIAGNOSIS — R932 Abnormal findings on diagnostic imaging of liver and biliary tract: Secondary | ICD-10-CM | POA: Diagnosis present

## 2014-02-02 DIAGNOSIS — K219 Gastro-esophageal reflux disease without esophagitis: Secondary | ICD-10-CM | POA: Insufficient documentation

## 2014-02-02 DIAGNOSIS — G4730 Sleep apnea, unspecified: Secondary | ICD-10-CM | POA: Diagnosis not present

## 2014-02-02 DIAGNOSIS — Z79899 Other long term (current) drug therapy: Secondary | ICD-10-CM | POA: Insufficient documentation

## 2014-02-02 DIAGNOSIS — I10 Essential (primary) hypertension: Secondary | ICD-10-CM | POA: Insufficient documentation

## 2014-02-02 DIAGNOSIS — B1910 Unspecified viral hepatitis B without hepatic coma: Secondary | ICD-10-CM | POA: Insufficient documentation

## 2014-02-02 DIAGNOSIS — I509 Heart failure, unspecified: Secondary | ICD-10-CM | POA: Diagnosis not present

## 2014-02-02 LAB — CBC
HCT: 40.4 % (ref 39.0–52.0)
Hemoglobin: 13.3 g/dL (ref 13.0–17.0)
MCH: 26.7 pg (ref 26.0–34.0)
MCHC: 32.9 g/dL (ref 30.0–36.0)
MCV: 81.1 fL (ref 78.0–100.0)
PLATELETS: 217 10*3/uL (ref 150–400)
RBC: 4.98 MIL/uL (ref 4.22–5.81)
RDW: 14.5 % (ref 11.5–15.5)
WBC: 4.9 10*3/uL (ref 4.0–10.5)

## 2014-02-02 LAB — PROTIME-INR
INR: 1.07 (ref 0.00–1.49)
Prothrombin Time: 14.1 seconds (ref 11.6–15.2)

## 2014-02-02 LAB — APTT: APTT: 34 s (ref 24–37)

## 2014-02-02 MED ORDER — FENTANYL CITRATE 0.05 MG/ML IJ SOLN
INTRAMUSCULAR | Status: AC
  Filled 2014-02-02: qty 4

## 2014-02-02 MED ORDER — GELATIN ABSORBABLE 12-7 MM EX MISC
CUTANEOUS | Status: AC
  Filled 2014-02-02: qty 1

## 2014-02-02 MED ORDER — LIDOCAINE HCL (PF) 1 % IJ SOLN
INTRAMUSCULAR | Status: AC
  Filled 2014-02-02: qty 10

## 2014-02-02 MED ORDER — MIDAZOLAM HCL 2 MG/2ML IJ SOLN
INTRAMUSCULAR | Status: AC
  Filled 2014-02-02: qty 4

## 2014-02-02 MED ORDER — SODIUM CHLORIDE 0.9 % IV SOLN: INTRAVENOUS | Status: DC

## 2014-02-02 MED ORDER — MIDAZOLAM HCL 2 MG/2ML IJ SOLN
INTRAMUSCULAR | Status: AC | PRN
  Administered 2014-02-02: 1 mg via INTRAVENOUS

## 2014-02-02 MED ORDER — HYDROCODONE-ACETAMINOPHEN 5-325 MG PO TABS: 1.0000 | ORAL_TABLET | ORAL | Status: DC | PRN

## 2014-02-02 MED ORDER — FENTANYL CITRATE 0.05 MG/ML IJ SOLN
INTRAMUSCULAR | Status: AC | PRN
  Administered 2014-02-02: 50 ug via INTRAVENOUS

## 2014-02-02 NOTE — Sedation Documentation (Signed)
Patient denies pain and is resting comfortably.  

## 2014-02-02 NOTE — Procedures (Signed)
Procedure:  Ultrasound guided core biopsy of liver Findings:  Random 18 G core biopsy x 2 via 17 G needle. Tract embolized with Gelfoam pledgets. No immediate complications. Plan:  3 hr recovery

## 2014-02-02 NOTE — Discharge Instructions (Signed)
Liver Biopsy, Care After °These instructions give you information on caring for yourself after your procedure. Your doctor may also give you more specific instructions. Call your doctor if you have any problems or questions after your procedure. °HOME CARE °· Rest at home for 1-2 days or as told by your doctor. °· Have someone stay with you for at least 24 hours. °· Do not do these things in the first 24 hours: °¨ Drive. °¨ Use machinery. °¨ Take care of other people. °¨ Sign legal documents. °¨ Take a bath or shower. °· There are many different ways to close and cover a cut (incision). For example, a cut can be closed with stitches, skin glue, or adhesive strips. Follow your doctor's instructions on: °¨ Taking care of your cut. °¨ Changing and removing your bandage (dressing). °¨ Removing whatever was used to close your cut. °· Do not drink alcohol in the first week. °· Do not lift more than 5 pounds or play contact sports for the first 2 weeks. °· Take medicines only as told by your doctor. For 1 week, do not take medicine that has aspirin in it or medicines like ibuprofen. °· Get your test results. °GET HELP IF: °· A cut bleeds and leaves more than just a small spot of blood. °· A cut is red, puffs up (swells), or hurts more than before. °· Fluid or something else comes from a cut. °· A cut smells bad. °· You have a fever or chills. °GET HELP RIGHT AWAY IF: °· You have swelling, bloating, or pain in your belly (abdomen). °· You get dizzy or faint. °· You have a rash. °· You feel sick to your stomach (nauseous) or throw up (vomit). °· You have trouble breathing, feel short of breath, or feel faint. °· Your chest hurts. °· You have problems talking or seeing. °· You have trouble balancing or moving your arms or legs. °Document Released: 11/23/2007 Document Revised: 06/30/2013 Document Reviewed: 04/11/2013 °ExitCare® Patient Information ©2015 [employer]. This information is not intended to replace advice given to  you by your health care provider. Make sure you discuss any questions you have with your health care provider. ° °

## 2014-02-02 NOTE — Telephone Encounter (Signed)
Form filled out and signed by Dr. Loletha Grayer and given to [place].

## 2014-02-02 NOTE — H&P (Signed)
Chief Complaint: Abnormal appearing liver on CT Possible cirrhosis  Referring Physician(s): Stark,Malcolm T  History of Present Illness: Gregory Klein is a 48 y.o. male  Hep B Abn appearing liver on CT--probable cirrhosis Noted 2012 New CT reveals abnormal finding again Labs wnl Scheduled for liver core biopsy  New diagnosis myocarditis after strep throat infection 11/2013 Now on Coreg   Past Medical History  Diagnosis Date  . 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 Neck pain     local injections x 2, las summer 2011 (Dr.Vortek)  . GERD (gastroesophageal reflux disease)   . Hypertension   . Cirrhosis     per CT, saw GI 11-2010, Bx was Rx   . Hemorrhoids   . Strep pharyngitis   . Acute myocarditis     a. in the setting of a recent strep throat inefection 12/26/13. LHC with no CAD and cadiac MR with finding c/w myocarditis  . Congestive heart failure   . Sleep apnea     Past Surgical History  Procedure Laterality Date  . Inguinal hernia repair    . Knee arthroscopy  6-15    Dr Mardelle Matte    Allergies: Amoxicillin  Medications: Prior to Admission medications   Medication Sig Start Date End Date Taking? Authorizing Provider  carvedilol (COREG) 3.125 MG tablet Take 1 tablet (3.125 mg total) by mouth 2 (two) times daily with a meal. 12/30/13  Yes Eileen Stanford, PA-C  OVER THE COUNTER MEDICATION Place 1-2 drops into both eyes as needed (for dry eyes). "[place] for Dry Eyes"   Yes Historical Provider, MD  polyethylene glycol (MIRALAX / GLYCOLAX) packet Take 17 g by mouth daily as needed for moderate constipation.   Yes Historical Provider, MD  psyllium (METAMUCIL) 58.6 % powder Take 1 packet by mouth as needed (for constipation).    Yes Historical Provider, MD  valACYclovir (VALTREX) 1000 MG tablet Take 2,000 mg by mouth See admin instructions. Take 2 tablets  (2 gms) twice daily for 2 days as needed for outbreaks   Yes Historical Provider, MD     Family History  Problem Relation Age of Onset  . Coronary artery disease Maternal Grandmother   . Hypertension Mother   . Stroke Mother   . Diabetes Paternal Grandfather   . Colon cancer Neg Hx   . Prostate cancer Maternal Grandfather 63  . Hypertension Father   . Hypertension Maternal Grandmother   . Breast cancer Paternal Grandmother   . Pancreatic cancer Paternal Aunt   . Esophageal cancer Neg Hx   . Kidney disease Neg Hx     History   Social History  . Marital Status: Single    Spouse Name: N/A    Number of Children: 0  . Years of Education: N/A   Occupational History  . Microbiologist   .     Social History Main Topics  . Smoking status: Never Smoker   . Smokeless tobacco: Never Used  . Alcohol Use: No  . Drug Use: No  . Sexual Activity: Not on file   Other Topics Concern  . Not on file   Social History Narrative        Review of Systems: A 12 point ROS discussed and pertinent positives are indicated in the HPI above.  All other systems are negative.  Review of Systems  Constitutional: Positive for fatigue. Negative for activity change and unexpected weight change.  Respiratory:  Negative for chest tightness and shortness of breath.   Cardiovascular: Negative for chest pain.  Gastrointestinal: Negative for abdominal pain.  Musculoskeletal: Negative for back pain.  Neurological: Negative for weakness.  Psychiatric/Behavioral: Negative for behavioral problems and confusion.    Vital Signs: BP 142/88 mmHg  Pulse 77  Temp(Src) 98.5 F (36.9 C) (Oral)  Resp 20  Ht 5\' 11"  (1.803 m)  Wt 81.647 kg (180 lb)  BMI 25.12 kg/m2  SpO2 100%  Physical Exam  Constitutional: He is oriented to person, place, and time. He appears well-nourished.  Cardiovascular: Normal rate and regular rhythm.   No murmur heard. Pulmonary/Chest: Effort normal and breath sounds normal. He has no wheezes.  Abdominal: Soft. Bowel sounds are normal. There is no tenderness.   Musculoskeletal: Normal range of motion.  Neurological: He is alert and oriented to person, place, and time.  Skin: Skin is warm and dry.  Psychiatric: He has a normal mood and affect. His behavior is normal. Judgment and thought content normal.    Imaging: US Abdomen Complete  01/29/2014   CLINICAL DATA:  Hepatic lobulation on the prior CT scan from 2012 suggested cirrhosis. Serology was not supportive and the patient did not undergo biopsy or tertiary referral. Mildly elevated AST recently which might be myocardial. Preprocedural for biopsy.  EXAM: ULTRASOUND ABDOMEN COMPLETE  COMPARISON:  CT scan from 10/10/2010  FINDINGS: Gallbladder: Unremarkable  Common bile duct: Diameter: 4 mm  Liver: Mildly lobular inferior margin and lateral segment left hepatic lobe, but without other specific morphologic findings of cirrhosis.  IVC: No abnormality visualized.  Pancreas: Visualized portion unremarkable.  Spleen: Size and appearance within normal limits.  Right Kidney: Length: 9.6 cm. Echogenicity within normal limits. No mass or hydronephrosis visualized.  Left Kidney: Length: 11.1 cm. Echogenicity within normal limits. No mass or hydronephrosis visualized.  Abdominal aorta: No aneurysm visualized. Bifurcation not well seen due to overlying bowel gas.  Other findings: None.  IMPRESSION: 1. Slightly lobular inferior and left lateral margins of the liver the C could be a manifestation of cirrhosis but are not specifically diagnostic. No focal mass identified.   Electronically Signed   By: Sherryl Barters M.D.   On: 01/29/2014 09:14    Labs:  CBC:  Recent Labs  12/27/13 0830 12/28/13 0332 12/29/13 0300 12/30/13 0359  WBC 10.2 8.8 8.6 7.8  HGB 13.2 12.3* 12.5* 11.6*  HCT 39.7 37.1* 37.9* 36.6*  PLT 254 276 317 340    COAGS:  Recent Labs  12/26/13 2119  INR 1.15    BMP:  Recent Labs  12/26/13 2119 12/28/13 0332 12/29/13 0300 12/30/13 0359  NA 142 142 142 146  K 3.6* 3.5* 4.1 4.0   CL 101 104 104 108  CO2 30 30 27 28   GLUCOSE 95 94 97 101*  BUN 11 9 10 8   CALCIUM 8.9 8.6 8.8 8.7  CREATININE 1.10 1.08 1.11 1.12  GFRNONAA 79* 81* 78* 77*  GFRAA >90 >90 >90 89*    LIVER FUNCTION TESTS:  Recent Labs  04/30/13 0909 11/10/13 0901 12/26/13 2119  BILITOT 1.0 0.7 0.9  AST 18 20 97*  ALT 21 17 38  ALKPHOS 49 54 65  PROT 6.6 7.0 7.1  ALBUMIN 3.7 3.5 3.1*    TUMOR MARKERS: No results for input(s): AFPTM, CEA, CA199, CHROMGRNA in the last 8760 hours.  Assessment and Plan:  Abnormal liver on CT 2012 Probable cirrhosis Abnormal appearing liver on recent CT  Now scheduled for liver core  biopsy Pt aware of procedure benefits and risks and agreeable to proceed Consent signed andin chart  Thank you for this interesting consult.  I greatly enjoyed meeting Gregory Klein and look forward to participating in their care.    I spent a total of 20 minutes face to face in clinical consultation, greater than 50% of which was counseling/coordinating care for liver core bx  Signed: Param Capri A 02/02/2014, 10:26 AM

## 2014-02-02 NOTE — Sedation Documentation (Addendum)
Patient denies pain and is resting comfortably.  

## 2014-02-02 NOTE — Telephone Encounter (Signed)
Received signed Claim Application for Short Term Disability form from Dr [employer].  Notified patient form was completed and signed.  Patient requested it be faxed to [place] and mail Original to him.  Faxed 02/02/14 and Mailed Original to patient 02/02/14. lp

## 2014-02-04 ENCOUNTER — Telehealth: Payer: Self-pay

## 2014-02-04 NOTE — Telephone Encounter (Signed)
Message     - BENIGN LIVER WITH MINIMAL PORTAL INFLAMMATION, SEE COMMENT.    - TRICHROME STAIN DEMONSTRATES NO SIGNIFICANT FIBROSIS.        No evidence of cirrhosis or any significant liver disease on this biopsy            ----- Message -----     From: Kellie Moor, RN     Sent: 02/04/2014  9:14 AM      To: Ladene Artist, MD        Left message for patient to call back

## 2014-02-05 ENCOUNTER — Encounter (HOSPITAL_COMMUNITY): Payer: Self-pay | Admitting: Cardiovascular Disease

## 2014-02-05 NOTE — Telephone Encounter (Signed)
Patient notified of results. He will call back for additional questions or concerns

## 2014-02-12 ENCOUNTER — Encounter: Payer: Self-pay | Admitting: Cardiovascular Disease

## 2014-02-12 ENCOUNTER — Ambulatory Visit (INDEPENDENT_AMBULATORY_CARE_PROVIDER_SITE_OTHER): Payer: BC Managed Care – PPO | Admitting: Cardiovascular Disease

## 2014-02-12 VITALS — BP 132/84 | HR 73 | Resp 16 | Ht 71.0 in | Wt 186.4 lb

## 2014-02-12 DIAGNOSIS — I10 Essential (primary) hypertension: Secondary | ICD-10-CM

## 2014-02-12 DIAGNOSIS — I409 Acute myocarditis, unspecified: Secondary | ICD-10-CM

## 2014-02-12 NOTE — Patient Instructions (Signed)
Your physician has requested that you have a limited echocardiogram. Echocardiography is a painless test that uses sound waves to create images of your heart. It provides your doctor with information about the size and shape of your heart and how well your heart's chambers and valves are working. This procedure takes approximately one hour. There are no restrictions for this procedure.  Your physician recommends that you schedule a follow-up appointment in: 12 months with Dr.Croitoru

## 2014-02-14 ENCOUNTER — Encounter: Payer: Self-pay | Admitting: Cardiovascular Disease

## 2014-02-14 NOTE — Assessment & Plan Note (Signed)
Clinically, he has made a complete recovery and his ESR is now normal. ECG evolving in expected patter. Repeat echo to confirm resolution of regional wall motion abnormalities. Statistically this was most likely a viral myopericarditis. The "strep throat" occurred too soon before presentation and the clinical and ECG findings were really not typical for rheumatic fever.

## 2014-02-14 NOTE — Progress Notes (Signed)
Patient ID: Gregory Klein, male   DOB: 1965-06-05, 48 y.o.   MRN: [id number]      Reason for office visit Acute myopericarditis  Job is feeling completely back to normal. He presented with chest pain and near syncope on October 30 and had a highly abnormal ECG and elevated cardiac enzymes  (troponin >20) and an apical wall motion abnormality on echo. Coronary angio was normal and the nuclear study showed inflammatory changes in the apex and basal inferior wall and pericardium. The EF was around 50% by both echo and cardiac MRI. No valvular abnormalities and no evidence of AV node conduction abnormalities. He returned in follow up about a month ago and his ecg was still abnormal. Subsequently, ESR has returned to normal levels. Only 9 days before the presentation he had received azithromycin for a "strep throat".  He is asymptomatic.  He has chronic Hep B infection, but a recent core biopsy did not show evidence of cirrhosis (suspeted based on imaging studies) and only showed minimal periportal inflammatory changes. Scheduled for a sleep study.  Allergies  Allergen Reactions  . Amoxicillin Rash    Current Outpatient Prescriptions  Medication Sig Dispense Refill  . carvedilol (COREG) 3.125 MG tablet Take 1 tablet (3.125 mg total) by mouth 2 (two) times daily with a meal. 60 tablet 11  . OVER THE COUNTER MEDICATION Place 1-2 drops into both eyes as needed (for dry eyes). "[facility] for Dry Eyes"    . polyethylene glycol (MIRALAX / GLYCOLAX) packet Take 17 g by mouth daily as needed for moderate constipation.    . psyllium (METAMUCIL) 58.6 % powder Take 1 packet by mouth as needed (for constipation).     . valACYclovir (VALTREX) 1000 MG tablet Take 2,000 mg by mouth See admin instructions. Take 2 tablets  (2 gms) twice daily for 2 days as needed for outbreaks     No current facility-administered medications for this visit.    Past Medical History  Diagnosis Date  . Hepatitis B  infection     hx of dx 2005 aprox  . Neck pain     local injections x 2, las summer 2011 (Dr.Vortek)  . GERD (gastroesophageal reflux disease)   . Hypertension   . Cirrhosis     per CT, saw GI 11-2010, Bx was Rx   . Hemorrhoids   . Strep pharyngitis   . Acute myocarditis     a. in the setting of a recent strep throat inefection 12/26/13. LHC with no CAD and cadiac MR with finding c/w myocarditis  . Congestive heart failure   . Sleep apnea     Past Surgical History  Procedure Laterality Date  . Inguinal hernia repair    . Knee arthroscopy  6-15    Dr Mardelle Matte  . Left heart catheterization with coronary angiogram N/A 12/29/2013    Procedure: LEFT HEART CATHETERIZATION WITH CORONARY ANGIOGRAM;  Surgeon: [name], MD;  Location: [facility] CATH LAB;  Service: Cardiovascular;  Laterality: N/A;    Family History  Problem Relation Age of Onset  . Coronary artery disease Maternal Grandmother   . Hypertension Mother   . Stroke Mother   . Diabetes Paternal Grandfather   . Colon cancer Neg Hx   . Prostate cancer Maternal Grandfather 26  . Hypertension Father   . Hypertension Maternal Grandmother   . Breast cancer Paternal Grandmother   . Pancreatic cancer Paternal Aunt   . Esophageal cancer Neg Hx   .  Kidney disease Neg Hx     History   Social History  . Marital Status: Single    Spouse Name: N/A    Number of Children: 0  . Years of Education: N/A   Occupational History  . Microbiologist   .     Social History Main Topics  . Smoking status: Never Smoker   . Smokeless tobacco: Never Used  . Alcohol Use: No  . Drug Use: No  . Sexual Activity: Not on file   Other Topics Concern  . Not on file   Social History Narrative        Review of systems: The patient specifically denies any chest pain at rest or with exertion, dyspnea at rest or with exertion, orthopnea, paroxysmal nocturnal dyspnea, syncope, palpitations, focal neurological deficits, intermittent  claudication, lower extremity edema, unexplained weight gain, cough, hemoptysis or wheezing.  The patient also denies abdominal pain, nausea, vomiting, dysphagia, diarrhea, constipation, polyuria, polydipsia, dysuria, hematuria, frequency, urgency, abnormal bleeding or bruising, fever, chills, unexpected weight changes, mood swings, change in skin or hair texture, change in voice quality, auditory or visual problems, allergic reactions or rashes, new musculoskeletal complaints other than usual "aches and pains".   PHYSICAL EXAM BP 132/84 mmHg  Pulse 73  Resp 16  Ht _0  (1.803 m)  Wt 186 lb 6.4 oz (84.55 kg)  BMI 26.01 kg/m2  General: Alert, oriented x3, no distress Head: no evidence of trauma, PERRL, EOMI, no exophtalmos or lid lag, no myxedema, no xanthelasma; normal ears, nose and oropharynx Neck: normal jugular venous pulsations and no hepatojugular reflux; brisk carotid pulses without delay and no carotid bruits Chest: clear to auscultation, no signs of consolidation by percussion or palpation, normal fremitus, symmetrical and full respiratory excursions Cardiovascular: normal position and quality of the apical impulse, regular rhythm, normal first and second heart sounds, no murmurs, rubs or gallops Abdomen: no tenderness or distention, no masses by palpation, no abnormal pulsatility or arterial bruits, normal bowel sounds, no hepatosplenomegaly Extremities: no clubbing, cyanosis or edema; 2+ radial, ulnar and brachial pulses bilaterally; 2+ right femoral, posterior tibial and dorsalis pedis pulses; 2+ left femoral, posterior tibial and dorsalis pedis pulses; no subclavian or femoral bruits Neurological: grossly nonfocal   Lipid Panel     Component Value Date/Time   CHOL 182 04/30/2013 0909   TRIG 26.0 04/30/2013 0909   HDL 54.90 04/30/2013 0909   CHOLHDL 3 04/30/2013 0909   VLDL 5.2 04/30/2013 0909   LDLCALC 122* 04/30/2013 0909   LDLDIRECT 135.8 03/29/2011 1043    BMET     Component Value Date/Time   NA 146 12/30/2013 0359   K 4.0 12/30/2013 0359   CL 108 12/30/2013 0359   CO2 28 12/30/2013 0359   GLUCOSE 101* 12/30/2013 0359   BUN 8 12/30/2013 0359   CREATININE 1.12 12/30/2013 0359   CALCIUM 8.7 12/30/2013 0359   GFRNONAA 77* 12/30/2013 0359   GFRAA 89* 12/30/2013 0359     ASSESSMENT AND PLAN Acute myocarditis Clinically, he has made a complete recovery and his ESR is now normal. ECG evolving in expected patter. Repeat echo to confirm resolution of regional wall motion abnormalities. Statistically this was most likely a viral myopericarditis. The "strep throat" occurred too soon before presentation and the clinical and ECG findings were really not typical for rheumatic fever.  HTN during his hospital stay his old BP med (losartan) was stopped and he was started on carvedilol. He has lost several pounds since then.His  BP is low. He may not need any BP meds if he restricts sodium intake, exercises and maintains a healthy weight.Will ait for f/u echo before deciding whether to stop carvedilol and recommending an exercise prescription.  Patient Instructions  Your physician has requested that you have a limited echocardiogram. Echocardiography is a painless test that uses sound waves to create images of your heart. It provides your doctor with information about the size and shape of your heart and how well your heart's chambers and valves are working. This procedure takes approximately one hour. There are no restrictions for this procedure.  Your physician recommends that you schedule a follow-up appointment in: 12 months with Dr.Khaleesi Gruel      Orders Placed This Encounter  Procedures  . 2D Echocardiogram without contrast   No orders of the defined types were placed in this encounter.    Holli Humbles, MD, [place] [phone] office [phone] pager

## 2014-02-17 ENCOUNTER — Ambulatory Visit (HOSPITAL_COMMUNITY)
Admission: RE | Admit: 2014-02-17 | Discharge: 2014-02-17 | Disposition: A | Discharge status: Home / Self Care | Payer: BC Managed Care – PPO | Source: Ambulatory Visit | Attending: Cardiovascular Disease | Admitting: Cardiovascular Disease

## 2014-02-17 ENCOUNTER — Other Ambulatory Visit: Payer: Self-pay | Admitting: *Deleted

## 2014-02-17 DIAGNOSIS — G4730 Sleep apnea, unspecified: Secondary | ICD-10-CM | POA: Insufficient documentation

## 2014-02-17 DIAGNOSIS — I10 Essential (primary) hypertension: Secondary | ICD-10-CM | POA: Insufficient documentation

## 2014-02-17 DIAGNOSIS — I071 Rheumatic tricuspid insufficiency: Secondary | ICD-10-CM | POA: Diagnosis not present

## 2014-02-17 DIAGNOSIS — Z8249 Family history of ischemic heart disease and other diseases of the circulatory system: Secondary | ICD-10-CM | POA: Insufficient documentation

## 2014-02-17 DIAGNOSIS — I509 Heart failure, unspecified: Secondary | ICD-10-CM | POA: Insufficient documentation

## 2014-02-17 DIAGNOSIS — I409 Acute myocarditis, unspecified: Secondary | ICD-10-CM | POA: Insufficient documentation

## 2014-02-17 NOTE — Progress Notes (Signed)
2D Limited Echocardiogram Complete.  02/17/2014   Marji Kuehnel [place], [place]

## 2014-02-24 ENCOUNTER — Telehealth: Payer: Self-pay | Admitting: Cardiovascular Disease

## 2014-02-24 NOTE — Telephone Encounter (Signed)
Communicated Dr. Victorino December instructions to patient, patient voiced understanding. He will start back on his Losartan today and record BPs daily. He will contact us next week with updated BPs. Patient voiced no other concerns at this time.

## 2014-02-24 NOTE — Telephone Encounter (Signed)
Pt called in stating that his BP has been elevated for the past couple of days. Today's reading 132/93, yesterday's 152/101. He stated over the holiday he ate BBQ, Chitterlings....etc. He would like to know if that is the cause of his high BP and if so should to start back taking his medication to regulate it. Please call  Thanks

## 2014-02-24 NOTE — Telephone Encounter (Signed)
He definitely needs to restart his losartan in previous dose. Effect should be full by 4-5 days or so. Please continue to record BP and let us know in a week.

## 2014-02-24 NOTE — Telephone Encounter (Signed)
Pt has recently been seen by Dr. Sallyanne Kuster in office following occurrence of myocarditis. He had been advised to discontinue BP meds and has been doing well at home.  Pt has checked BP over last several days and was concerned over some values, following are reported by pt:  12/24 - 140/90 12/25 - 150/94 12/26 - 144/97 12/27 - 153/95   148/87 12/28 - 160/101  134/91 12/29 - 132/93  Pt denies symptoms but states he has taken losartan at least once in response to high BPs.  He also reports recent use of mucinex and albuterol for relief URI symptoms. Denies acute CP or SOB at this time.  I told pt I would report his findings to Dr. Sallyanne Kuster and see if he recommended restarting or changing any meds.

## 2014-02-27 DIAGNOSIS — M722 Plantar fascial fibromatosis: Secondary | ICD-10-CM

## 2014-02-27 DIAGNOSIS — M7661 Achilles tendinitis, right leg: Secondary | ICD-10-CM

## 2014-02-27 DIAGNOSIS — M7662 Achilles tendinitis, left leg: Secondary | ICD-10-CM

## 2014-02-27 HISTORY — DX: Plantar fascial fibromatosis: M72.2

## 2014-02-27 HISTORY — DX: Achilles tendinitis, left leg: M76.62

## 2014-02-27 HISTORY — DX: Achilles tendinitis, right leg: M76.61

## 2014-03-03 ENCOUNTER — Telehealth: Payer: Self-pay | Admitting: Cardiovascular Disease

## 2014-03-03 NOTE — Telephone Encounter (Signed)
Pt. Informed of Dr. Victorino December instructions

## 2014-03-03 NOTE — Telephone Encounter (Signed)
BP has improved. I think if he sticks to his plan to lose weight and avoids excessive dietary sodium, no need to increase BP meds

## 2014-03-03 NOTE — Telephone Encounter (Signed)
Pt. Wanted you to know about his bp readings over the past week 146/96,138/95 122/85,115/79/137/82, 130/90,128/84,142/93,130/90 pt states he 's without symptomsc

## 2014-03-03 NOTE — Telephone Encounter (Signed)
Please call,Dr C told him to give his blood pressure readings to a nurse. If he does not answer cell phone,call [phone] work#.

## 2014-03-23 ENCOUNTER — Telehealth: Payer: Self-pay | Admitting: Cardiovascular Disease

## 2014-03-23 DIAGNOSIS — R0683 Snoring: Secondary | ICD-10-CM

## 2014-03-23 DIAGNOSIS — R0681 Apnea, not elsewhere classified: Secondary | ICD-10-CM

## 2014-03-23 NOTE — Telephone Encounter (Signed)
Returned call to patient.He stated he is scheduled to have sleep study done 04/02/14 at [facility].Stated he would like to have split night study.Message sent to Dr.Croitoru for a order.

## 2014-03-23 NOTE — Telephone Encounter (Signed)
Patient has appt for sleep study.  Per sleep center, order is for baseline.  Patient is requesting split night so they can go ahead and fit with CPAP and he won't have to go two nights.Marland KitchenMarland Kitchen

## 2014-03-24 NOTE — Telephone Encounter (Signed)
Please change order to split night study

## 2014-03-24 NOTE — Telephone Encounter (Signed)
Sleep study changed to a split night study.  Patient notified and will be done 04/02/14.

## 2014-04-02 ENCOUNTER — Ambulatory Visit (HOSPITAL_BASED_OUTPATIENT_CLINIC_OR_DEPARTMENT_OTHER): Payer: BC Managed Care – PPO | Attending: Cardiovascular Disease | Admitting: Radiology

## 2014-04-02 ENCOUNTER — Encounter (HOSPITAL_BASED_OUTPATIENT_CLINIC_OR_DEPARTMENT_OTHER): Payer: BC Managed Care – PPO

## 2014-04-02 VITALS — Ht 71.0 in | Wt 186.0 lb

## 2014-04-02 DIAGNOSIS — R0683 Snoring: Secondary | ICD-10-CM

## 2014-04-02 DIAGNOSIS — R0681 Apnea, not elsewhere classified: Secondary | ICD-10-CM | POA: Insufficient documentation

## 2014-04-05 NOTE — Addendum Note (Signed)
Addended by: Shelva Majestic A on: 04/05/2014 12:49 PM   Modules accepted: Level of Service

## 2014-04-05 NOTE — Sleep Study (Signed)
 NAME: Gregory Klein DATE OF BIRTH:  07/15/1965 MEDICAL RECORD:  [id number]  LOCATION: [facility]  PHYSICIAN: Gregory,THOMAS Klein  DATE OF STUDY: 04/02/2014  SLEEP STUDY TYPE: Split Night PSG/CPAP tiration               REFERRING PHYSICIAN: Croitoru, Mihai, MD  INDICATION FOR STUDY:  [name] is Klein 49-year-old male who is referred for Klein split-night protocol sleep study.  He has Klein history of hepatitis B, cirrhosis, hypertension, and has complaints of significant snoring, witnessed apnea, daytime sleepiness and nonrestorative sleep.  EPWORTH SLEEPINESS SCORE:  14 which is compatible with excessive daytime sleepiness HEIGHT: 5' 11" (180.3 cm)  WEIGHT: 186 lb (84.369 kg)    Body mass index is 25.95 kg/(m^2).  NECK SIZE: 16 in.  MEDICATIONS:  valACYclovir (VALTREX) 1000 MG tablet 2,000 mg, See admin instructions psyllium (METAMUCIL) 58.6 % powder 1 packet, As needed polyethylene glycol (MIRALAX / GLYCOLAX) packet 17 g, Daily PRN   SLEEP ARCHITECTURE:  Split-night criteria were met.  On the diagnostic portion of the study, the patient slept for 123 minutes out of Klein sleep period of time of 125 minutes.  Sleep efficiency was 95.3%.  There was near immediate sleep onset.  Latency to REM sleep was 80 minutes.  The patient spent 8 minutes in stage I (6.5%), 83 minutes in stage II (67.5%) and 32 minutes in stage REM (26%).  He slept entirely in supine sleep and was in supine REM sleep for 32 minutes (26%).  The arousal index was increased at 11.7 per hour.  There was moderate snoring.  On Klein CPAP titration portion of the study, the patient slept for 215.5 minutes out of Klein sleep period of time 220.5 minutes.  Sleep latency was 4 minutes and latency to REM sleep was 69.5 minutes.  Patient spent 13 minutes in stage I (6%) 128.5 minutes in stage II protheses 59.6%) 74% in rem sleep (34.3%).  Sleep was entirely in the supine position, of which 34.3% was supine REM sleep.    RESPIRATORY DATA:  On the diagnostic portion of the night, there were 31 obstructive apneas, 0 central and 0 mixed apneas and 13 hypotony is.  The apnea hypopnea index (AHI) was 1.5 per hour which is consistent with moderate obstructive sleep apnea.  These occurred predominantly in REM sleep, such that the apnea hypotony index in REM sleep was 63.8 per hour.   CPAP therapy was initiated at 5 cm water pressure and was titrated up to 12 cm water pressure.  The patient was able to sleep in the REM supine position at 12 cm pressure.  The AHI was 1.2 per hour.  OXYGEN DATA:  The baseline oxygen saturation was 93%.  On the diagnostic portion of the study, and the lowest oxygen saturation with non-REM sleep was 90% and with REM sleep was 79%.  CARDIAC DATA:  Sinus rhythm was observed with an average heart rate at 66 bpm.  There were occasional PACs.  MOVEMENT/PARASOMNIA:  There were 0 periodic limb movements.  IMPRESSION/ RECOMMENDATION:   Moderate obstructive sleep apnea/hypopnea syndrome overall , but with severe sleep apnea during REM sleep. Respiratory events with frequent oxygen desaturation to Klein nadir of 79% with REM sleep. Moderate snoring. Abnormal arousal index. No evidence for nocturnal myoclonus.  Recommend initial use of CPAP with C-Flex/APR of 3 at 12 cm water pressure with Klein heated humidifier.  The patient should be counseled on good sleep hygiene.    Recommend Klein download be obtained in 30 days and sleep clinic evaluation.   Gregory,THOMAS Klein Diplomate, American Board of Sleep Medicine  ELECTRONICALLY SIGNED ON:  04/05/2014, 12:30 PM [facility] PH: [phone]   FX: [phone] ACCREDITED BY THE AMERICAN ACADEMY OF SLEEP MEDICINE  

## 2014-04-10 ENCOUNTER — Telehealth: Payer: Self-pay | Admitting: *Deleted

## 2014-04-10 NOTE — Telephone Encounter (Signed)
Left message to return a call. (sleep study results)

## 2014-05-14 ENCOUNTER — Telehealth: Payer: Self-pay

## 2014-05-14 ENCOUNTER — Other Ambulatory Visit: Payer: Self-pay

## 2014-05-14 NOTE — Telephone Encounter (Signed)
No answer. Unable to leave a message due to mailbox being full.  Flu-12/28/13 Td-08/01/07  PNA (23)- 12/28/13 CCS- 04/17/02; repeat in 5 years PSA- 04/30/13 HIV- 02/04/13

## 2014-05-14 NOTE — Telephone Encounter (Signed)
Faxed CPAP orders back to [facility]

## 2014-05-15 ENCOUNTER — Ambulatory Visit (INDEPENDENT_AMBULATORY_CARE_PROVIDER_SITE_OTHER): Payer: BC Managed Care – PPO | Admitting: Internal Medicine

## 2014-05-15 ENCOUNTER — Encounter: Payer: Self-pay | Admitting: Internal Medicine

## 2014-05-15 VITALS — BP 124/64 | HR 69 | Temp 97.2°F | Ht 71.0 in | Wt 194.4 lb

## 2014-05-15 DIAGNOSIS — Z0000 Encounter for general adult medical examination without abnormal findings: Secondary | ICD-10-CM

## 2014-05-15 LAB — ALT: ALT: 21 U/L (ref 0–53)

## 2014-05-15 LAB — BASIC METABOLIC PANEL
BUN: 15 mg/dL (ref 6–23)
CO2: 34 mEq/L — ABNORMAL HIGH (ref 19–32)
CREATININE: 1.19 mg/dL (ref 0.40–1.50)
Calcium: 9.2 mg/dL (ref 8.4–10.5)
Chloride: 101 mEq/L (ref 96–112)
GFR: 84.21 mL/min (ref >60.00)
Glucose, Bld: 103 mg/dL — ABNORMAL HIGH (ref 70–99)
POTASSIUM: 4.8 meq/L (ref 3.5–5.1)
Sodium: 136 mEq/L (ref 135–145)

## 2014-05-15 LAB — LIPID PANEL
CHOLESTEROL: 166 mg/dL (ref 0–200)
HDL: 56.7 mg/dL (ref >39.00)
LDL Cholesterol: 103 mg/dL — ABNORMAL HIGH (ref 0–99)
NONHDL: 109.3
Total CHOL/HDL Ratio: 3
Triglycerides: 33 mg/dL (ref 0.0–149.0)
VLDL: 6.6 mg/dL (ref 0.0–40.0)

## 2014-05-15 LAB — AST: AST: 18 U/L (ref 0–37)

## 2014-05-15 NOTE — Progress Notes (Signed)
Subjective:    Patient ID: Gregory Klein, male    DOB: December 27, 1965, 49 y.o.   MRN: [id number]  DOS:  05/15/2014 Type of visit - description : cpx Interval history: Currently feeling well , since the last time he was here he was admitted to the hospital. Chart reviewed    Review of Systems  Constitutional: No fever, chills. No unexplained wt changes. No unusual sweats HEENT: No dental problems, ear discharge, facial swelling, voice changes. No eye discharge, redness or intolerance to light Respiratory: No wheezing or difficulty breathing. No cough , mucus production Cardiovascular: No CP, leg swelling or palpitations GI: no nausea, vomiting, diarrhea or abdominal pain.  No blood in the stools. No dysphagia   Endocrine: No polyphagia, polyuria or polydipsia GU: No dysuria, gross hematuria, difficulty urinating. No urinary urgency or frequency. Musculoskeletal: No joint swellings or unusual aches or pains Skin: No change in the color of the skin, palor or rash Allergic, immunologic: No environmental allergies or food allergies Neurological: No dizziness or syncope. No headaches. No diplopia, slurred speech, motor deficits, facial numbness Hematological: No enlarged lymph nodes, easy bruising or bleeding Psychiatry: No suicidal ideas, hallucinations, behavior problems or confusion. No unusual/severe anxiety or depression.    Past Medical History  Diagnosis Date  . 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 Neck pain     local injections x 2, las summer 2011 (Dr.Vortek)  . GERD (gastroesophageal reflux disease)   . Hypertension   . Cirrhosis     BX NEG 01-2014 NO CIRRHOSIS   . Hemorrhoids   . Strep pharyngitis   . Acute myocarditis     a. in the setting of a recent strep throat inefection 12/26/13. LHC with no CAD and cadiac MR with finding c/w myocarditis  . Congestive heart failure   . Sleep apnea     Past Surgical History  Procedure Laterality Date  . Inguinal  hernia repair    . Knee arthroscopy  6-15    Dr Mardelle Matte  . Left heart catheterization with coronary angiogram N/A 12/29/2013    Procedure: LEFT HEART CATHETERIZATION WITH CORONARY ANGIOGRAM;  Surgeon: [name], MD;  Location: [facility] CATH LAB;  Service: Cardiovascular;  Laterality: N/A;    History   Social History  . Marital Status: Single    Spouse Name: N/A  . Number of Children: 0  . Years of Education: N/A   Occupational History  . Microbiologist   .     Social History Main Topics  . Smoking status: Never Smoker   . Smokeless tobacco: Never Used  . Alcohol Use: No  . Drug Use: No  . Sexual Activity: Not on file   Other Topics Concern  . Not on file   Social History Narrative    Lives by himself     Family History  Problem Relation Age of Onset  . Coronary artery disease Maternal Grandmother   . Hypertension Mother   . Stroke Mother   . Diabetes Paternal Grandfather   . Colon cancer Neg Hx   . Prostate cancer Maternal Grandfather 23  . Hypertension Father   . Hypertension Maternal Grandmother   . Breast cancer Paternal Grandmother   . Pancreatic cancer Paternal Aunt   . Esophageal cancer Neg Hx   . Kidney disease Neg Hx        Medication List       This list is accurate as of:  05/15/14 11:59 PM.  Always use your most recent med list.               losartan 50 MG tablet  Commonly known as:  COZAAR  Take 50 mg by mouth daily.     OVER THE COUNTER MEDICATION  Place 1-2 drops into both eyes as needed (for dry eyes). "[place] for Dry Eyes"     polyethylene glycol packet  Commonly known as:  MIRALAX / GLYCOLAX  Take 17 g by mouth daily as needed for moderate constipation.     psyllium 58.6 % powder  Commonly known as:  METAMUCIL  Take 1 packet by mouth as needed (for constipation).     VALTREX 1000 MG tablet  Generic drug:  valACYclovir  Take 2,000 mg by mouth See admin instructions. Take 2 tablets  (2 gms) twice daily for 2 days as  needed for outbreaks           Objective:   Physical Exam BP 124/64 mmHg  Pulse 69  Temp(Src) 97.2 F (36.2 C) (Oral)  Ht 5\' 11"  (1.803 m)  Wt 194 lb 6 oz (88.168 kg)  BMI 27.12 kg/m2  SpO2 98% General:   Well developed, well nourished . NAD.  Neck:  Full range of motion. Supple. No  thyromegaly   HEENT:  Normocephalic . Face symmetric, atraumatic Lungs:  CTA B Normal respiratory effort, no intercostal retractions, no accessory muscle use. Heart: RRR,  no murmur.  Abdomen:  Not distended, soft, non-tender. No rebound or rigidity. No mass,organomegaly Muscle skeletal: no pretibial edema bilaterally  Skin: Exposed areas without rash. Not pale. Not jaundice Neurologic:  alert & oriented X3.  Speech normal, gait appropriate for age and unassisted Strength symmetric and appropriate for age.  Psych: Cognition and judgment appear intact.  Cooperative with normal attention span and concentration.  Behavior appropriate. No anxious or depressed appearing.      Assessment & Plan:

## 2014-05-15 NOTE — Assessment & Plan Note (Addendum)
Td 09 Flu-12/28/13 PNA (25)- 12/28/13  CCS- 04/17/02; per report "repeat in 5 years" but has  no FH, asx , will rec cscope at age 49 for screening  PSA- consistently normal, last check 04/30/13   diet improved, exercise-- discussed   Other issues: Cirrhosis, saw GI, Dr. Fuller Plan, 12-2013, see note below, had a [place] and then a bx---> negative. Pt has no evidence oc cirrhosis Abnormal imaging of the liver. Imaging showed features typically noted in cirrhosis. However there are no physical examination or blood work abnormalities typically noted in cirrhosis. His liver was normal on abdominal ultrasound in 2007. We discussed the risks and benefits of liver biopsy. We also discussed referral to a [facility] for expert hepatologists opinion or observation. After a discussion of these options he prefers to proceed with liver biopsy at this time. Schedule abd [place].  Hypertension, did a trial w/o  losartan, BP got elevated, self restarted losartan at the end of December 2015. BP satisfactory.  Diagnosed with a sleep apnea through sleep study 03/2014, to have a CPAP soon.

## 2014-05-15 NOTE — Progress Notes (Signed)
Pre visit review using our clinic review tool, if applicable. No additional management support is needed unless otherwise documented below in the visit note. 

## 2014-05-15 NOTE — Patient Instructions (Signed)
Get your blood work before you leave   Check the  blood pressure   weekly  Be sure your blood pressure is between 110/65 and  145/85.  if it is consistently higher or lower, let me know     Come back to the office in 6 months   for a routine check up

## 2014-06-11 ENCOUNTER — Ambulatory Visit: Payer: BC Managed Care – PPO | Admitting: Cardiovascular Disease

## 2014-07-20 ENCOUNTER — Ambulatory Visit (INDEPENDENT_AMBULATORY_CARE_PROVIDER_SITE_OTHER): Payer: BC Managed Care – PPO | Admitting: Cardiovascular Disease

## 2014-07-20 ENCOUNTER — Encounter: Payer: Self-pay | Admitting: Cardiovascular Disease

## 2014-07-20 VITALS — BP 118/74 | HR 74 | Ht 71.0 in | Wt 202.1 lb

## 2014-07-20 DIAGNOSIS — I409 Acute myocarditis, unspecified: Secondary | ICD-10-CM

## 2014-07-20 DIAGNOSIS — Z9989 Dependence on other enabling machines and devices: Secondary | ICD-10-CM

## 2014-07-20 DIAGNOSIS — G4733 Obstructive sleep apnea (adult) (pediatric): Secondary | ICD-10-CM

## 2014-07-20 DIAGNOSIS — I10 Essential (primary) hypertension: Secondary | ICD-10-CM

## 2014-07-20 NOTE — Patient Instructions (Signed)
Your physician recommends that you schedule a follow-up appointment as  needed with Dr. Claiborne Billings for sleep.

## 2014-07-21 ENCOUNTER — Encounter: Payer: Self-pay | Admitting: Cardiovascular Disease

## 2014-07-21 DIAGNOSIS — Z9989 Dependence on other enabling machines and devices: Secondary | ICD-10-CM

## 2014-07-21 DIAGNOSIS — G4733 Obstructive sleep apnea (adult) (pediatric): Secondary | ICD-10-CM | POA: Insufficient documentation

## 2014-07-21 NOTE — Progress Notes (Signed)
Patient ID: Gregory Klein, male   DOB: 08-17-1965, 49 y.o.   MRN: [id number]     HPI: Gregory Klein, is a 49 y.o. male who presents for sleep clinic evaluation following recent initiation of CPAP therapy for obstructive sleep apnea.  Gregory Klein is a 50 year old patient of Dr. Cicero Duck, who is referred for sleep study for evaluation of obstructive sleep apnea.  He has a history of myocarditis which developed last year in October 2016.  This has resolved.  He has a history of hypertension and has been taking losartan.  Due to concerns for sleep apnea.  He was referred for a split-night protocol sleep study which was done on 04/02/2014.  This was positive for obstructive sleep apnea with an AHI overall of 21.5, placing him in the moderate sleep apnea category.  However, sleep apnea was severe with wrench sleep at 63.8/hr. He had moderate snoring.  His baseline oxygen saturation was 93% and on the diagnostic portion of the study.  The oxygen need tear was 79% during grams sleep.  He had an occasional PAC but was in sinus rhythm.  He was titrated up to a 12 cm water pressure.  He was started on CPAP on 05/18/2014 and has an AirSense 10AutoSet unit which had been set at 12 cm.  He has a Education officer, community nasal mask.  A 30 day compliance download was obtained from 06/16/2014 through 07/15/2014.  I reviewed this with him at length today.  On his present download.  He is not meeting compliance standards with only 67% of days of usage an average usage at 4 hours and 8 minutes but only 30% of the days greater than 4 hours.  AHI, however, is excellent at 1.2 with treatment.  A long discussion with him today concerning the fact that he is continually sleepy.  He oftentimes has been falling asleep on the couch and at times has gone to bed and not put his machine on after waking up at 1 AM.  He typically wakes up at 6 AM for work.  His sleep duration has been inadequate.  An Epworth Sleepiness Scale was recalculated  today and this was elevated and endorsed at 17 as noted below.  Epworth Sleepiness Scale: Situation   Chance of Dozing/Sleeping (0 = never , 1 = slight chance , 2 = moderate chance , 3 = high chance )   sitting and reading 2   watching TV 3   sitting inactive in a public place 3   being a passenger in a motor vehicle for an hour or more 3   lying down in the afternoon 3   sitting and talking to someone 1   sitting quietly after lunch (no alcohol) 1   while stopped for a few minutes in traffic as the driver 1   Total Score  17    Past Medical History  Diagnosis Date  . 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 Neck pain     local injections x 2, las summer 2011 (Dr.Vortek)  . GERD (gastroesophageal reflux disease)   . Hypertension   . Cirrhosis     BX NEG 01-2014 NO CIRRHOSIS   . Hemorrhoids   . Strep pharyngitis   . Acute myocarditis     a. in the setting of a recent strep throat inefection 12/26/13. LHC with no CAD and cadiac MR with finding c/w myocarditis  . Congestive heart failure   .  Sleep apnea     Past Surgical History  Procedure Laterality Date  . Inguinal hernia repair    . Knee arthroscopy  6-15    Dr Mardelle Matte  . Left heart catheterization with coronary angiogram N/A 12/29/2013    Procedure: LEFT HEART CATHETERIZATION WITH CORONARY ANGIOGRAM;  Surgeon: [name], MD;  Location: [facility] C[facility] LAB;  Service: Cardiovascular;  Laterality: N/A;    Allergies  Allergen Reactions  . Amoxicillin Rash    Current Outpatient Prescriptions  Medication Sig Dispense Refill  . losartan (COZAAR) 50 MG tablet Take 50 mg by mouth daily.    Marland Kitchen OVER THE COUNTER MEDICATION Place 1-2 drops into both eyes as needed (for dry eyes). "[facility] for Dry Eyes"    . polyethylene glycol (MIRALAX / GLYCOLAX) packet Take 17 g by mouth daily as needed for moderate constipation.    . psyllium (METAMUCIL) 58.6 % powder Take 1 packet by mouth as needed (for constipation).     .  valACYclovir (VALTREX) 1000 MG tablet Take 2,000 mg by mouth See admin instructions. Take 2 tablets  (2 gms) twice daily for 2 days as needed for outbreaks     No current facility-administered medications for this visit.    History   Social History  . Marital Status: Single    Spouse Name: N/A  . Number of Children: 0  . Years of Education: N/A   Occupational History  . Microbiologist   .     Social History Main Topics  . Smoking status: Never Smoker   . Smokeless tobacco: Never Used  . Alcohol Use: No  . Drug Use: No  . Sexual Activity: Not on file   Other Topics Concern  . Not on file   Social History Narrative    Lives by himself   Additional social history is notable that he graduated from [employer].  He receives in his ministry degree from [employer].  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  Family History  Problem Relation Age of Onset  . Coronary artery disease Maternal Grandmother   . Hypertension Maternal Grandmother   . Hypertension Mother   . Stroke Mother   . Diabetes Paternal Grandfather   . Colon cancer Neg Hx   . Esophageal cancer Neg Hx   . Kidney disease Neg Hx   . Prostate cancer Maternal Grandfather 74  . Hypertension Father   . Breast cancer Paternal Grandmother   . Pancreatic cancer Paternal Aunt      ROS General: Negative; No fevers, chills, or night sweats HEENT: Negative; No changes in vision or hearing, sinus congestion, difficulty swallowing Pulmonary: Negative; No cough, wheezing, shortness of breath, hemoptysis Cardiovascular: Negative; No chest pain, presyncope, syncope, palpatations GI: Negative; No nausea, vomiting, diarrhea, or abdominal pain GU: Negative; No dysuria, hematuria, or difficulty voiding Musculoskeletal: Negative; no myalgias, joint pain, or weakness Hematologic: Negative; no easy bruising, bleeding Endocrine: Negative; no heat/cold intolerance Neuro: Negative; no changes in  balance, headaches Skin: Negative; No rashes or skin lesions Psychiatric: Negative; No behavioral problems, depression Sleep: See history of present illness: No bruxism, restless legs, hypnogognic hallucinations, no cataplexy   Physical Exam BP 118/74 mmHg  Pulse 74  Ht _0  (1.803 m)  Wt 202 lb 1.6 oz (91.672 kg)  BMI 28.20 kg/m2   Wt Readings from Last 3 Encounters:  07/20/14 202 lb 1.6 oz (91.672 kg)  05/15/14 194 lb 6 oz (88.168 kg)  04/02/14 186 lb (84.369 kg)   General: Alert, oriented, no distress.  Skin: normal turgor, no rashes HEENT: Normocephalic, atraumatic. Pupils round and reactive; sclera anicteric; extraocular muscles intact; Fundi without hemorrhages or exudates Nose without nasal septal hypertrophy Mouth/Parynx benign; Mallinpatti scale 2 Neck: No JVD, no carotid briuts Lungs: clear to ausculatation and percussion; no wheezing or rales  Chest wall: No tenderness to palpation Heart: RRR, s1 s2 normal , faint 1/6 systolic murmur.  No S3 gallop. Abdomen: Mild central adiposity soft, nontender; no hepatosplenomehaly, BS+; abdominal aorta nontender and not dilated by palpation. Back: No CVA tenderness Pulses 2+ Extremities: no clubbinbg cyanosis or edema, Homan's sign negative  Neurologic: grossly nonfocal; cranial nerves intact. Psychological: Normal affect and mood.  LABS:  BMP Latest Ref Rng 05/15/2014 12/30/2013 12/29/2013  Glucose 70 - 99 mg/dL 103(H) 101(H) 97  BUN 6 - 23 mg/dL _0 Creatinine 0.40 - 1.50 mg/dL 1.19 1.12 1.11  Sodium 135 - 145 mEq/L 136 146 142  Potassium 3.5 - 5.1 mEq/L 4.8 4.0 4.1  Chloride 96 - 112 mEq/L 101 108 104  CO2 19 - 32 mEq/L 34(H) 28 27  Calcium 8.4 - 10.5 mg/dL 9.2 8.7 8.8     Hepatic Function Latest Ref Rng 05/15/2014 12/26/2013 11/10/2013  Total Protein 6.0 - 8.3 g/dL - 7.1 7.0  Albumin 3.5 - 5.2 g/dL - 3.1(L) 3.5  AST 0 - 37 U/L 18 97(H) 20  ALT 0 - 53 U/L 21 38 17  Alk Phosphatase 39 - 117 U/L - 65 54  Total  Bilirubin 0.3 - 1.2 mg/dL - 0.9 0.7  Bilirubin, Direct 0.0 - 0.3 mg/dL - - -     CBC Latest Ref Rng 02/02/2014 12/30/2013 12/29/2013  WBC 4.0 - 10.5 K/uL 4.9 7.8 8.6  Hemoglobin 13.0 - 17.0 g/dL 13.3 11.6(L) 12.5(L)  Hematocrit 39.0 - 52.0 % 40.4 36.6(L) 37.9(L)  Platelets 150 - 400 K/uL 217 340 317     Lipid Panel     Component Value Date/Time   CHOL 166 05/15/2014 0835   TRIG 33.0 05/15/2014 0835   HDL 56.70 05/15/2014 0835   CHOLHDL 3 05/15/2014 0835   VLDL 6.6 05/15/2014 0835   LDLCALC 103* 05/15/2014 0835   LDLDIRECT 135.8 03/29/2011 1043     RADIOLOGY: No results found.    ASSESSMENT AND PLAN: Gregory Klein is a 49 year old African-American male who was diagnosed with recent moderate obstructive sleep apnea overall with an AHI of 21.5/hr. During REM sleep, sleep apnea was very severe at 63.8 per hour and he has documented oxygen desaturation to a nadir of 79%.  He is not compliant on his most recent download.  He continues to have significant residual, hypersomnolence, which I suspect may be due to a combination of inadequate sleep duration and as result, not achieving adequate grams sleep during the evening.  Oftentimes he has fullness sleep on the couch and has not put his unit on when going to bed.  I discussed a sleep study in detail and reviewed both normal sleep architecture, as well as abnormal sleep architecture.  I discussed the importance of obtaining 7-8 hours of sleep and that the majority of REM sleep occurs in the latter portion of sleep.  I discussed the adverse Eulas Post vascular consequences associated with untreated sleep apnea.  In attempt to try to improve compliance I will change his fixed pressure to an auto unit since there may be a positional component to his sleep apnea severity.  I will also add a chinstrap since he does have a component of mouth breathing which should significantly reduce his elevated leak which was noted on his download.  A subsequent  download will be obtained in 30 days, which hopefully will show marked improvement.  I answered all his questions.  I'll be available on an as-needed basis from a sleep perspective.  Time spent: 30 minutes  Troy Sine, MD, [facility]  07/21/2014 7:23 PM

## 2014-09-15 ENCOUNTER — Encounter: Payer: Self-pay | Admitting: Cardiovascular Disease

## 2014-10-02 ENCOUNTER — Telehealth: Payer: Self-pay | Admitting: Internal Medicine

## 2014-10-02 MED ORDER — LOSARTAN POTASSIUM 50 MG PO TABS: 50.0000 mg | ORAL_TABLET | Freq: Every day | ORAL | Status: DC

## 2014-10-02 NOTE — Telephone Encounter (Signed)
Losartan has been refilled to [facility], #30 tabs and 2 refills. Please inform Pt that he is overdue for his CPE and will need to either be seen for a F/U or his CPE before his refills run out.

## 2014-10-02 NOTE — Telephone Encounter (Signed)
Relation to BJ:YNWG  Call back number: [phone] Pharmacy: [facility], [place] [phone] (Fax)         Reason for call:  Patient requesting a refill losartan (COZAAR) 50 MG tablet

## 2014-10-02 NOTE — Telephone Encounter (Signed)
Patient scheduled follow up for 10/06/2014

## 2014-10-06 ENCOUNTER — Encounter: Payer: Self-pay | Admitting: Internal Medicine

## 2014-10-06 ENCOUNTER — Ambulatory Visit (INDEPENDENT_AMBULATORY_CARE_PROVIDER_SITE_OTHER): Payer: BC Managed Care – PPO | Admitting: Internal Medicine

## 2014-10-06 VITALS — BP 128/76 | HR 72 | Temp 98.2°F | Ht 71.0 in | Wt 204.1 lb

## 2014-10-06 DIAGNOSIS — I10 Essential (primary) hypertension: Secondary | ICD-10-CM | POA: Diagnosis not present

## 2014-10-06 LAB — BASIC METABOLIC PANEL
BUN: 13 mg/dL (ref 6–23)
CO2: 29 mEq/L (ref 19–32)
CREATININE: 1.24 mg/dL (ref 0.40–1.50)
Calcium: 9 mg/dL (ref 8.4–10.5)
Chloride: 104 mEq/L (ref 96–112)
GFR: 80.17 mL/min (ref >60.00)
Glucose, Bld: 113 mg/dL — ABNORMAL HIGH (ref 70–99)
POTASSIUM: 4.2 meq/L (ref 3.5–5.1)
Sodium: 139 mEq/L (ref 135–145)

## 2014-10-06 MED ORDER — LOSARTAN POTASSIUM 50 MG PO TABS: 50.0000 mg | ORAL_TABLET | Freq: Every day | ORAL | Status: DC

## 2014-10-06 NOTE — Assessment & Plan Note (Signed)
Well-controlled, check a BMP, continue with losartan

## 2014-10-06 NOTE — Progress Notes (Signed)
Subjective:    Patient ID: Gregory Klein, male    DOB: [birth year], 49 y.o.   MRN: [id number]  DOS:  10/06/2014 Type of visit - description : Follow-up Interval history: Hypertension, good compliance of medication, no apparent side effects Has a skin lesion at the left leg for at least a year, has not changed or gotten darker. Using a CPAP consistently, feeling well. Has gained weight, reports he is very sedentary  Review of Systems  Denies chest pain or difficulty breathing No nausea, vomiting, diarrhea  Past Medical History  Diagnosis Date  . 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 Neck pain     local injections x 2, las summer 2011 (Dr.Vortek)  . GERD (gastroesophageal reflux disease)   . Hypertension   . Cirrhosis     BX NEG 01-2014 NO CIRRHOSIS   . Hemorrhoids   . Acute myocarditis 2015    viral vs d/t strep infection, resolved w/o sequela  . Sleep apnea     on CPAP     Past Surgical History  Procedure Laterality Date  . Inguinal hernia repair    . Knee arthroscopy  6-15    Dr Mardelle Matte  . Left heart catheterization with coronary angiogram N/A 12/29/2013    Procedure: LEFT HEART CATHETERIZATION WITH CORONARY ANGIOGRAM;  Surgeon: [name], MD;  Location: [facility] CATH LAB;  Service: Cardiovascular;  Laterality: N/A;    History   Social History  . Marital Status: Single    Spouse Name: N/A  . Number of Children: 0  . Years of Education: N/A   Occupational History  . Microbiologist   .     Social History Main Topics  . Smoking status: Never Smoker   . Smokeless tobacco: Never Used  . Alcohol Use: No  . Drug Use: No  . Sexual Activity: Not on file   Other Topics Concern  . Not on file   Social History Narrative    Lives by himself        Medication List       This list is accurate as of: 10/06/14  6:10 PM.  Always use your most recent med list.               losartan 50 MG tablet  Commonly known as:  COZAAR  Take 1 tablet (50  mg total) by mouth daily.     OVER THE COUNTER MEDICATION  Place 1-2 drops into both eyes as needed (for dry eyes). "[place] for Dry Eyes"     polyethylene glycol packet  Commonly known as:  MIRALAX / GLYCOLAX  Take 17 g by mouth daily as needed for moderate constipation.     psyllium 58.6 % powder  Commonly known as:  METAMUCIL  Take 1 packet by mouth as needed (for constipation).     VALTREX 1000 MG tablet  Generic drug:  valACYclovir  Take 2,000 mg by mouth See admin instructions. Take 2 tablets  (2 gms) twice daily for 2 days as needed for outbreaks           Objective:   Physical Exam  Skin:      BP 128/76 mmHg  Pulse 72  Temp(Src) 98.2 F (36.8 C) (Oral)  Ht 5\' 11"  (1.803 m)  Wt 204 lb 2 oz (92.59 kg)  BMI 28.48 kg/m2  SpO2 99% General:   Well developed, well nourished . NAD.  HEENT:  Normocephalic . Face symmetric, atraumatic Lungs:  CTA B Normal respiratory effort, no intercostal retractions, no accessory muscle use. Heart: RRR,  no murmur.  No pretibial edema bilaterally  Skin: Not pale. Not jaundice Neurologic:  alert & oriented X3.  Speech normal, gait appropriate for age and unassisted Psych--  Cognition and judgment appear intact.  Cooperative with normal attention span and concentration.  Behavior appropriate. No anxious or depressed appearing.      Assessment & Plan:   Skin lesion: Likely postinflammatory hyperpigmentation, recommend observation.

## 2014-10-06 NOTE — Patient Instructions (Signed)
Get your blood work before you leave    

## 2014-10-06 NOTE — Progress Notes (Signed)
Pre visit review using our clinic review tool, if applicable. No additional management support is needed unless otherwise documented below in the visit note. 

## 2014-12-28 ENCOUNTER — Encounter: Payer: Self-pay | Admitting: Internal Medicine

## 2014-12-28 ENCOUNTER — Ambulatory Visit (INDEPENDENT_AMBULATORY_CARE_PROVIDER_SITE_OTHER): Payer: BC Managed Care – PPO | Admitting: Internal Medicine

## 2014-12-28 VITALS — BP 114/74 | HR 71 | Temp 98.3°F | Ht 71.0 in | Wt 204.2 lb

## 2014-12-28 DIAGNOSIS — M2142 Flat foot [pes planus] (acquired), left foot: Secondary | ICD-10-CM

## 2014-12-28 DIAGNOSIS — R739 Hyperglycemia, unspecified: Secondary | ICD-10-CM | POA: Diagnosis not present

## 2014-12-28 DIAGNOSIS — Z09 Encounter for follow-up examination after completed treatment for conditions other than malignant neoplasm: Secondary | ICD-10-CM

## 2014-12-28 DIAGNOSIS — M2141 Flat foot [pes planus] (acquired), right foot: Secondary | ICD-10-CM

## 2014-12-28 DIAGNOSIS — Z23 Encounter for immunization: Secondary | ICD-10-CM

## 2014-12-28 DIAGNOSIS — I10 Essential (primary) hypertension: Secondary | ICD-10-CM | POA: Diagnosis not present

## 2014-12-28 LAB — HEMOGLOBIN A1C: HEMOGLOBIN A1C: 5.9 % (ref 4.6–6.5)

## 2014-12-28 MED ORDER — LOSARTAN POTASSIUM 50 MG PO TABS: 50.0000 mg | ORAL_TABLET | Freq: Every day | ORAL | Status: DC

## 2014-12-28 NOTE — Patient Instructions (Signed)
Get your blood work before you leave      Next visit  for a physical by 04-2015    Please schedule an appointment at the front desk Please come back fasting

## 2014-12-28 NOTE — Assessment & Plan Note (Signed)
HTN: Refill medications, no change Flatfeet: Refer to podiatry. Hyperglycemia: Recent CBGs slightly elevated, patient is quite concerned about diabetes, request A1c. Primary care: Flu shot today RTC 04-2015 for a physical

## 2014-12-28 NOTE — Progress Notes (Signed)
Pre visit review using our clinic review tool, if applicable. No additional management support is needed unless otherwise documented below in the visit note. 

## 2014-12-28 NOTE — Progress Notes (Signed)
Subjective:    Patient ID: Gregory Klein, male    DOB: Jul 11, 1965, 49 y.o.   MRN: [id number]  DOS:  12/28/2014 Type of visit - description : routine office visit interval history: HTN: Good compliance of medication, last BMP satisfactory. BP today is very good H/o pes planus , occasional pain at the bottom of the foot bilaterally and ankles. Would like a referral. Mild hyperglycemia the last time he was here, request a A1c   Review of Systems No chest pain or difficulty breathing. No nausea, vomiting, diarrhea  Past Medical History  Diagnosis Date  . 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 Neck pain     local injections x 2, las summer 2011 (Dr.Vortek)  . GERD (gastroesophageal reflux disease)   . Hypertension   . Cirrhosis ([place])     BX NEG 01-2014 NO CIRRHOSIS   . Hemorrhoids   . Acute myocarditis 2015    viral vs d/t strep infection, resolved w/o sequela  . Sleep apnea     on CPAP     Past Surgical History  Procedure Laterality Date  . Inguinal hernia repair    . Knee arthroscopy  6-15    Dr Mardelle Matte  . Left heart catheterization with coronary angiogram N/A 12/29/2013    Procedure: LEFT HEART CATHETERIZATION WITH CORONARY ANGIOGRAM;  Surgeon: [name], MD;  Location: [facility] CATH LAB;  Service: Cardiovascular;  Laterality: N/A;    Social History   Social History  . Marital Status: Single    Spouse Name: N/A  . Number of Children: 0  . Years of Education: N/A   Occupational History  . Microbiologist   .     Social History Main Topics  . Smoking status: Never Smoker   . Smokeless tobacco: Never Used  . Alcohol Use: No  . Drug Use: No  . Sexual Activity: Not on file   Other Topics Concern  . Not on file   Social History Narrative    Lives by himself        Medication List       This list is accurate as of: 12/28/14  5:00 PM.  Always use your most recent med list.               losartan 50 MG tablet  Commonly known as:   COZAAR  Take 1 tablet (50 mg total) by mouth daily.     OVER THE COUNTER MEDICATION  Place 1-2 drops into both eyes as needed (for dry eyes). "[place] for Dry Eyes"     polyethylene glycol packet  Commonly known as:  MIRALAX / GLYCOLAX  Take 17 g by mouth daily as needed for moderate constipation.     psyllium 58.6 % powder  Commonly known as:  METAMUCIL  Take 1 packet by mouth as needed (for constipation).     VALTREX 1000 MG tablet  Generic drug:  valACYclovir  Take 2,000 mg by mouth See admin instructions. Take 2 tablets  (2 gms) twice daily for 2 days as needed for outbreaks           Objective:   Physical Exam BP 114/74 mmHg  Pulse 71  Temp(Src) 98.3 F (36.8 C) (Oral)  Ht 5\' 11"  (1.803 m)  Wt 204 lb 4 oz (92.647 kg)  BMI 28.50 kg/m2  SpO2 98% General:   Well developed, well nourished . NAD.  HEENT:  Normocephalic .  Face symmetric, atraumatic Lungs:  CTA B Normal respiratory effort, no intercostal retractions, no accessory muscle use. Heart: RRR,  no murmur.  No pretibial edema bilaterally  MSK: Flatfeet bilateral, symmetric, no other abnormalities or TTP. Ankles normal. Skin: Not pale. Not jaundice Neurologic:  alert & oriented X3.  Speech normal, gait appropriate for age and unassisted Psych--  Cognition and judgment appear intact.  Cooperative with normal attention span and concentration.  Behavior appropriate. No anxious or depressed appearing.      Assessment & Plan:   Assessment>  HTN OSA on CPAP H/o cold sores , on valtrex  H/o Acute myocarditis --- 2015 , had a cath, problem related to strep, no sequela H/o Suspected cirrhosis, disproved by a negative biopsy 01/2014 H/o hep B infex 2015 H/o Neck pain, s/p 2 injections 2011 (Dr. Adline Peals)  Plan: HTN: Refill medications, no change Flatfeet: Refer to podiatry. Hyperglycemia: Recent CBGs slightly elevated, patient is quite concerned about diabetes, request A1c. Primary care: Flu shot  today RTC 04-2015 for a physical

## 2015-01-04 ENCOUNTER — Telehealth: Payer: Self-pay | Admitting: Internal Medicine

## 2015-01-04 MED ORDER — VALACYCLOVIR HCL 1 G PO TABS: 2000.0000 mg | ORAL_TABLET | ORAL | Status: DC

## 2015-01-04 NOTE — Telephone Encounter (Signed)
Okay to refill Valtrex #30 and one refill I released his results to my chart, his blood tests show a slightly elevated blood sugar considered prediabetes. Treatment is a healthier diet and daily exercise.

## 2015-01-04 NOTE — Telephone Encounter (Signed)
Please see message below about medication refill. Thank you

## 2015-01-04 NOTE — Addendum Note (Signed)
Addended by: Rudene Anda on: 01/04/2015 05:27 PM   Modules accepted: Orders

## 2015-01-04 NOTE — Telephone Encounter (Signed)
Patient request refill on Valtrex 1000 mg tablets, and lab results from 12/28/2014  Plse send to  [facility], [place] [phone] (Phone) [phone] (Fax)

## 2015-01-04 NOTE — Telephone Encounter (Addendum)
Pt made aware that refill has been sent to pharmacy.  He was also made aware of lab results.  Pt was disappointed in being "prediabetic."  Pt had multiple questions regarding what foods to eat.  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  We talked at length (approx. 15 mins) about small changes that could be made to his diet to help him eat healthier.  He stated understanding and agreed to implement some of these changes.  He said that he just went to the podiatrist and will not be able to exercise until inserts for shoes come back.  Pt was encouraged to start with his diet and then incorporate exercise when he is able.  He stated understanding and agreed.  A nutrition consult (ok'd by Dr. [name]) was offered, but patient declines at this time.  He was encouraged to call back if he changes his mind.  He agreed.  No further needs at this time.

## 2015-01-04 NOTE — Telephone Encounter (Signed)
valACYclovir (VALTREX) 1000 MG tablet         Sig - Route: Take 2,000 mg by mouth See admin instructions. Take 2 tablets (2 gms) twice daily for 2 days as needed for outbreaks - Oral    Class: Historical Med    Please advise regarding refill.

## 2015-01-07 ENCOUNTER — Other Ambulatory Visit: Payer: Self-pay

## 2015-02-19 ENCOUNTER — Encounter: Payer: Self-pay | Admitting: Internal Medicine

## 2015-02-19 ENCOUNTER — Ambulatory Visit (INDEPENDENT_AMBULATORY_CARE_PROVIDER_SITE_OTHER): Payer: BC Managed Care – PPO | Admitting: Internal Medicine

## 2015-02-19 VITALS — BP 106/66 | HR 68 | Temp 97.9°F | Ht 71.0 in | Wt 189.5 lb

## 2015-02-19 DIAGNOSIS — Z09 Encounter for follow-up examination after completed treatment for conditions other than malignant neoplasm: Secondary | ICD-10-CM

## 2015-02-19 DIAGNOSIS — R7303 Prediabetes: Secondary | ICD-10-CM | POA: Diagnosis not present

## 2015-02-19 DIAGNOSIS — R079 Chest pain, unspecified: Secondary | ICD-10-CM

## 2015-02-19 NOTE — Progress Notes (Signed)
Pre visit review using our clinic review tool, if applicable. No additional management support is needed unless otherwise documented below in the visit note. 

## 2015-02-19 NOTE — Progress Notes (Signed)
Subjective:    Patient ID: Gregory Klein, male    DOB: 01/02/1966, 49 y.o.   MRN: [id number]  DOS:  02/19/2015 Type of visit - description : Acute visit Interval history:  He felt a lump at the RIGHT side of the chest, apparently it self resolved. He did not notice any discharge or tenderness to palpation. He does have a dark skin spot  there now. Also, had sinusitis diagnosed early November, did a lot of coughing, since then is having right lateral chest pain, overall pain better, at this point is just a discomfort when he moves around. Recent A1c 5.9.  Review of Systems No fever, chills. Recently diagnosed with prediabetes, doing great with diet and exercise, has lost some weight  Past Medical History  Diagnosis Date  . 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 Neck pain     local injections x 2, las summer 2011 (Dr.Vortek)  . GERD (gastroesophageal reflux disease)   . Hypertension   . Cirrhosis ([place])     BX NEG 01-2014 NO CIRRHOSIS   . Hemorrhoids   . Acute myocarditis 2015    viral vs d/t strep infection, resolved w/o sequela  . Sleep apnea     on CPAP   . Achilles tendonitis, bilateral 2016    [name], DPM  . Plantar fasciitis, bilateral 2016    [name], DPM  . 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 Inguinal hernia repair    . Knee arthroscopy  6-15    Dr Mardelle Matte  . Left heart catheterization with coronary angiogram N/A 12/29/2013    Procedure: LEFT HEART CATHETERIZATION WITH CORONARY ANGIOGRAM;  Surgeon: [name], MD;  Location: [facility] CATH LAB;  Service: Cardiovascular;  Laterality: N/A;    Social History   Social History  . Marital Status: Single    Spouse Name: N/A  . Number of Children: 0  . Years of Education: N/A   Occupational History  . Microbiologist   .     Social  History Main Topics  . Smoking status: Never Smoker   . Smokeless tobacco: Never Used  . Alcohol Use: No  . Drug Use: No  . Sexual Activity: Not on file   Other Topics Concern  . Not on file   Social History Narrative    Lives by himself        Medication List       This list is accurate as of: 02/19/15 11:59 PM.  Always use your most recent med list.               losartan 50 MG tablet  Commonly known as:  COZAAR  Take 1 tablet (50 mg total) by mouth daily.     meloxicam 15 MG tablet  Commonly known as:  MOBIC  Take 1 tablet (15 mg total) by mouth daily. [name], DPM     OVER THE COUNTER MEDICATION  Place 1-2 drops into both eyes as needed (for dry eyes). "[place] for Dry Eyes"     psyllium 58.6 % powder  Commonly known as:  METAMUCIL  Take 1 packet by mouth as needed (for constipation). Reported on 02/19/2015     valACYclovir 1000 MG tablet  Commonly known as:  VALTREX  Take 2 tablets (2,000 mg total) by mouth See admin instructions. Take 2 tablets  (2 gms) twice daily for 2 days as needed for outbreaks           Objective:   Physical Exam  Constitutional: He appears well-nourished. No distress.  HENT:  Head: Normocephalic and atraumatic.  Pulmonary/Chest:  Chest wall examined has a round, 1.5 cm hyperpigmentation at the right lateral chest. The area is otherwise normal to palpation. The patient could not find on self palpation the knot he had recently. Chest wall is no TTP  Skin: He is not diaphoretic.  Psychiatric: He has a normal mood and affect. His behavior is normal. Judgment and thought content normal.   BP 106/66 mmHg  Pulse 68  Temp(Src) 97.9 F (36.6 C) (Oral)  Ht 5\' 11"  (1.803 m)  Wt 189 lb 8 oz (85.957 kg)  BMI 26.44 kg/m2  SpO2 99% No axilary LADs    Assessment & Plan:   Assessment>  Prediabetes A1C 5.9 (12-2014) HTN OSA on CPAP H/o cold sores , on valtrex  H/o Acute myocarditis --- 2015 , had a cath, problem  related to strep, no sequela H/o Suspected cirrhosis, disproved by a negative biopsy 01/2014 H/o hep B infex 2015 H/o Neck pain, s/p 2 injections 2011 (Dr. Adline Peals)  Plan: Prediabetes: Recent A1c discussed, doing better with diet and exercise, has lost some weight Skin lesion: The only finding today is hyperpigmentation at the right lateral chest wall. No mass, abscess. Recommend observation (likely postinflammatory hyperpigmentation) Right chest wall pain: Likely a chest wall sprain after recent episodes of cough.  RTC 04-2015 as a schedule

## 2015-02-22 DIAGNOSIS — R739 Hyperglycemia, unspecified: Secondary | ICD-10-CM

## 2015-02-22 HISTORY — DX: Hyperglycemia, unspecified: R73.9

## 2015-02-22 NOTE — Assessment & Plan Note (Signed)
Prediabetes: Recent A1c discussed, doing better with diet and exercise, has lost some weight Skin lesion: The only finding today is hyperpigmentation at the right lateral chest wall. No mass, abscess. Recommend observation (likely postinflammatory hyperpigmentation) Right chest wall pain: Likely a chest wall sprain after recent episodes of cough.  RTC 04-2015 as a schedule

## 2015-05-01 ENCOUNTER — Emergency Department (HOSPITAL_COMMUNITY)
Admission: EM | Admit: 2015-05-01 | Discharge: 2015-05-01 | Disposition: A | Discharge status: Home / Self Care | Payer: BC Managed Care – PPO | Attending: Emergency Medicine | Admitting: Emergency Medicine

## 2015-05-01 ENCOUNTER — Encounter (HOSPITAL_COMMUNITY): Payer: Self-pay

## 2015-05-01 ENCOUNTER — Emergency Department (HOSPITAL_COMMUNITY): Payer: BC Managed Care – PPO

## 2015-05-01 DIAGNOSIS — Z9981 Dependence on supplemental oxygen: Secondary | ICD-10-CM | POA: Insufficient documentation

## 2015-05-01 DIAGNOSIS — Z8739 Personal history of other diseases of the musculoskeletal system and connective tissue: Secondary | ICD-10-CM | POA: Insufficient documentation

## 2015-05-01 DIAGNOSIS — G4730 Sleep apnea, unspecified: Secondary | ICD-10-CM | POA: Insufficient documentation

## 2015-05-01 DIAGNOSIS — Z8619 Personal history of other infectious and parasitic diseases: Secondary | ICD-10-CM | POA: Diagnosis not present

## 2015-05-01 DIAGNOSIS — Z8719 Personal history of other diseases of the digestive system: Secondary | ICD-10-CM | POA: Insufficient documentation

## 2015-05-01 DIAGNOSIS — Z880 Allergy status to penicillin: Secondary | ICD-10-CM | POA: Diagnosis not present

## 2015-05-01 DIAGNOSIS — I10 Essential (primary) hypertension: Secondary | ICD-10-CM | POA: Insufficient documentation

## 2015-05-01 DIAGNOSIS — Z79899 Other long term (current) drug therapy: Secondary | ICD-10-CM | POA: Insufficient documentation

## 2015-05-01 DIAGNOSIS — R1032 Left lower quadrant pain: Secondary | ICD-10-CM | POA: Diagnosis present

## 2015-05-01 DIAGNOSIS — N23 Unspecified renal colic: Secondary | ICD-10-CM | POA: Diagnosis not present

## 2015-05-01 DIAGNOSIS — Z9889 Other specified postprocedural states: Secondary | ICD-10-CM | POA: Diagnosis not present

## 2015-05-01 LAB — BASIC METABOLIC PANEL
Anion gap: 12 (ref 5–15)
BUN: 17 mg/dL (ref 6–20)
CO2: 27 mmol/L (ref 22–32)
Calcium: 9.2 mg/dL (ref 8.9–10.3)
Chloride: 101 mmol/L (ref 101–111)
Creatinine, Ser: 1.42 mg/dL — ABNORMAL HIGH (ref 0.61–1.24)
GFR calc Af Amer: >60 mL/min (ref >60)
GFR, EST NON AFRICAN AMERICAN: 57 mL/min — AB (ref >60)
Glucose, Bld: 110 mg/dL — ABNORMAL HIGH (ref 65–99)
Potassium: 4.3 mmol/L (ref 3.5–5.1)
SODIUM: 140 mmol/L (ref 135–145)

## 2015-05-01 LAB — CBC WITH DIFFERENTIAL/PLATELET
BASOS ABS: 0 10*3/uL (ref 0.0–0.1)
Basophils Relative: 0 %
EOS ABS: 0 10*3/uL (ref 0.0–0.7)
EOS PCT: 0 %
HCT: 41.8 % (ref 39.0–52.0)
Hemoglobin: 13.6 g/dL (ref 13.0–17.0)
LYMPHS PCT: 32 %
Lymphs Abs: 1.5 10*3/uL (ref 0.7–4.0)
MCH: 26.5 pg (ref 26.0–34.0)
MCHC: 32.5 g/dL (ref 30.0–36.0)
MCV: 81.5 fL (ref 78.0–100.0)
Monocytes Absolute: 0.3 10*3/uL (ref 0.1–1.0)
Monocytes Relative: 7 %
Neutro Abs: 2.9 10*3/uL (ref 1.7–7.7)
Neutrophils Relative %: 61 %
PLATELETS: 250 10*3/uL (ref 150–400)
RBC: 5.13 MIL/uL (ref 4.22–5.81)
RDW: 14.1 % (ref 11.5–15.5)
WBC: 4.8 10*3/uL (ref 4.0–10.5)

## 2015-05-01 LAB — URINALYSIS, ROUTINE W REFLEX MICROSCOPIC
Bilirubin Urine: NEGATIVE
Glucose, UA: NEGATIVE mg/dL
Ketones, ur: NEGATIVE mg/dL
LEUKOCYTES UA: NEGATIVE
Nitrite: NEGATIVE
PROTEIN: NEGATIVE mg/dL
Specific Gravity, Urine: 1.023 (ref 1.005–1.030)
pH: 7.5 (ref 5.0–8.0)

## 2015-05-01 LAB — URINE MICROSCOPIC-ADD ON
BACTERIA UA: NONE SEEN
SQUAMOUS EPITHELIAL / LPF: NONE SEEN

## 2015-05-01 MED ORDER — SODIUM CHLORIDE 0.9 % IV BOLUS (SEPSIS)
1000.0000 mL | Freq: Once | INTRAVENOUS | Status: AC
  Administered 2015-05-01: 1000 mL via INTRAVENOUS

## 2015-05-01 MED ORDER — HYDROCODONE-ACETAMINOPHEN 5-325 MG PO TABS: 1.0000 | ORAL_TABLET | Freq: Four times a day (QID) | ORAL | Status: DC | PRN

## 2015-05-01 MED ORDER — KETOROLAC TROMETHAMINE 15 MG/ML IJ SOLN
15.0000 mg | Freq: Once | INTRAMUSCULAR | Status: AC
  Administered 2015-05-01: 15 mg via INTRAVENOUS
  Filled 2015-05-01: qty 1

## 2015-05-01 NOTE — Discharge Instructions (Signed)
You passed a 13mm ureteral stone today.  Get rechecked immediately if you develop fevers, recurrent pain, or new worrisome symptoms.     Kidney Stones Kidney stones (urolithiasis) are deposits that form inside your kidneys. The intense pain is caused by the stone moving through the urinary tract. When the stone moves, the ureter goes into spasm around the stone. The stone is usually passed in the urine.  CAUSES   A disorder that makes certain neck glands produce too much parathyroid hormone (primary hyperparathyroidism).  A buildup of uric acid crystals, similar to gout in your joints.  Narrowing (stricture) of the ureter.  A kidney obstruction present at birth (congenital obstruction).  Previous surgery on the kidney or ureters.  Numerous kidney infections. SYMPTOMS   Feeling sick to your stomach (nauseous).  Throwing up (vomiting).  Blood in the urine (hematuria).  Pain that usually spreads (radiates) to the groin.  Frequency or urgency of urination. DIAGNOSIS   Taking a history and physical exam.  Blood or urine tests.  CT scan.  Occasionally, an examination of the inside of the urinary bladder (cystoscopy) is performed. TREATMENT   Observation.  Increasing your fluid intake.  Extracorporeal shock wave lithotripsy--This is a noninvasive procedure that uses shock waves to break up kidney stones.  Surgery may be needed if you have severe pain or persistent obstruction. There are various surgical procedures. Most of the procedures are performed with the use of small instruments. Only small incisions are needed to accommodate these instruments, so recovery time is minimized. The size, location, and chemical composition are all important variables that will determine the proper choice of action for you. Talk to your health care provider to better understand your situation so that you will minimize the risk of injury to yourself and your kidney.  HOME CARE INSTRUCTIONS    Drink enough water and fluids to keep your urine clear or pale yellow. This will help you to pass the stone or stone fragments.  Strain all urine through the provided strainer. Keep all particulate matter and stones for your health care provider to see. The stone causing the pain may be as small as a grain of salt. It is very important to use the strainer each and every time you pass your urine. The collection of your stone will allow your health care provider to analyze it and verify that a stone has actually passed. The stone analysis will often identify what you can do to reduce the incidence of recurrences.  Only take over-the-counter or prescription medicines for pain, discomfort, or fever as directed by your health care provider.  Keep all follow-up visits as told by your health care provider. This is important.  Get follow-up X-rays if required. The absence of pain does not always mean that the stone has passed. It may have only stopped moving. If the urine remains completely obstructed, it can cause loss of kidney function or even complete destruction of the kidney. It is your responsibility to make sure X-rays and follow-ups are completed. Ultrasounds of the kidney can show blockages and the status of the kidney. Ultrasounds are not associated with any radiation and can be performed easily in a matter of minutes.  Make changes to your daily diet as told by your health care provider. You may be told to:  Limit the amount of salt that you eat.  Eat 5 or more servings of fruits and vegetables each day.  Limit the amount of meat, poultry, fish, and eggs  that you eat.  Collect a 24-hour urine sample as told by your health care provider.You may need to collect another urine sample every 6-12 months. SEEK MEDICAL CARE IF:  You experience pain that is progressive and unresponsive to any pain medicine you have been prescribed. SEEK IMMEDIATE MEDICAL CARE IF:   Pain cannot be controlled  with the prescribed medicine.  You have a fever or shaking chills.  The severity or intensity of pain increases over 18 hours and is not relieved by pain medicine.  You develop a new onset of abdominal pain.  You feel faint or pass out.  You are unable to urinate.   This information is not intended to replace advice given to you by your health care provider. Make sure you discuss any questions you have with your health care provider.   Document Released: 02/13/2005 Document Revised: 11/04/2014 Document Reviewed: 07/17/2012 Elsevier Interactive Patient Education [employer].

## 2015-05-01 NOTE — ED Notes (Signed)
Pt. Passed in stone. Strained urine.  Showed Dr. Ralene Bathe and placed stone in a specimen cup and gave it to the pt.

## 2015-05-01 NOTE — ED Notes (Signed)
Pt. Having lt. Lower abdominal pain. Began 1 hour ago.  Pt. Was in the process of going to a funeral.  He denies any n/v/d.  He denies any fevers.  Pain is constant 8/10.  Movement or touch does not increase the pain Skin is warm and dry, pt. Is alert and oriented X4

## 2015-05-01 NOTE — ED Provider Notes (Signed)
CSN: [id number]     Arrival date & time 05/01/15  [id number] History   First MD Initiated Contact with Patient 05/01/15 [phone]     Chief Complaint  Patient presents with  . Abdominal Pain     Patient is a 50 y.o. male presenting with abdominal pain. The history is provided by the patient. No language interpreter was used.  Abdominal Pain  Gregory Klein is a 50 y.o. male who presents to the Emergency Department complaining of abdominal pain.  About 5:30 this morning he developed a throbbing left lower quadrant abdominal pain. At times the pain radiates up to his left side and down to his left testicle. Pain is constant nature. He denies any nausea, vomiting, diarrhea, fevers, dysuria. There are no alleviating or worsening factors for his pain. No prior similar symptoms.  Past Medical History  Diagnosis Date  . 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 Neck pain     local injections x 2, las summer 2011 (Dr.Vortek)  . GERD (gastroesophageal reflux disease)   . Hypertension   . Cirrhosis ([place])     BX NEG 01-2014 NO CIRRHOSIS   . Hemorrhoids   . Acute myocarditis 2015    viral vs d/t strep infection, resolved w/o sequela  . Sleep apnea     on CPAP   . Achilles tendonitis, bilateral 2016    [name], DPM  . Plantar fasciitis, bilateral 2016    [name], DPM  . 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 Inguinal hernia repair    . Knee arthroscopy  6-15    Dr Mardelle Matte  . Left heart catheterization with coronary angiogram N/A 12/29/2013    Procedure: LEFT HEART CATHETERIZATION WITH CORONARY ANGIOGRAM;  Surgeon: [name], MD;  Location: [facility] CATH LAB;  Service: Cardiovascular;  Laterality: N/A;   Family History  Problem Relation Age of Onset  . Coronary artery disease Maternal Grandmother   . Hypertension Maternal Grandmother   .  Hypertension Mother   . Stroke Mother   . Diabetes Paternal Grandfather   . Colon cancer Neg Hx   . Esophageal cancer Neg Hx   . Kidney disease Neg Hx   . Prostate cancer Maternal Grandfather 44  . Hypertension Father   . Breast cancer Paternal Grandmother   . Pancreatic cancer Paternal Aunt    Social History  Substance Use Topics  . Smoking status: Never Smoker   . Smokeless tobacco: Never Used  . Alcohol Use: No    Review of Systems  Gastrointestinal: Positive for abdominal pain.  All other systems reviewed and are negative.     Allergies  Amoxicillin  Home Medications   Prior to Admission medications   Medication Sig Start Date End Date Taking? Authorizing Provider  cetirizine (ZYRTEC) 10 MG chewable tablet Chew 10 mg by mouth daily.   Yes Historical Provider, MD  losartan (COZAAR) 50 MG tablet Take 1 tablet (50 mg total) by mouth daily. 12/28/14  Yes Colon Branch, MD  naproxen sodium (ANAPROX) 220 MG tablet Take 220 mg by mouth 2 (two) times daily as needed (pain).   Yes Historical Provider, MD  OVER THE COUNTER MEDICATION Place 1-2 drops into both eyes as needed (for dry eyes). "[facility] for Dry Eyes"   Yes  Historical Provider, MD  psyllium (METAMUCIL) 58.6 % powder Take 1 packet by mouth as needed (for constipation). Reported on 02/19/2015   Yes Historical Provider, MD  valACYclovir (VALTREX) 1000 MG tablet Take 2 tablets (2,000 mg total) by mouth See admin instructions. Take 2 tablets  (2 gms) twice daily for 2 days as needed for outbreaks 01/04/15  Yes Colon Branch, MD  HYDROcodone-acetaminophen (NORCO/VICODIN) 5-325 MG tablet Take 1 tablet by mouth every 6 (six) hours as needed. 05/01/15   Quintella Reichert, MD   BP 122/86 mmHg  Pulse 74  Temp(Src) 97.8 F (36.6 C) (Oral)  Resp 20  SpO2 100% Physical Exam  Constitutional: He is oriented to person, place, and time. He appears well-developed and well-nourished.  HENT:  Head: Normocephalic and atraumatic.   Cardiovascular: Normal rate and regular rhythm.   No murmur heard. Pulmonary/Chest: Effort normal and breath sounds normal. No respiratory distress.  Abdominal: Soft. There is no tenderness. There is no rebound and no guarding.  Genitourinary: No penile tenderness.  No inguinal hernias. No scrotal swelling.  Musculoskeletal: He exhibits no edema or tenderness.  Neurological: He is alert and oriented to person, place, and time.  Skin: Skin is warm and dry.  Psychiatric: He has a normal mood and affect. His behavior is normal.  Nursing note and vitals reviewed.   ED Course  Procedures (including critical care time) Labs Review Labs Reviewed  BASIC METABOLIC PANEL - Abnormal; Notable for the following:    Glucose, Bld 110 (*)    Creatinine, Ser 1.42 (*)    GFR calc non Af Amer 57 (*)    All other components within normal limits  URINALYSIS, ROUTINE W REFLEX MICROSCOPIC (NOT AT [facility]) - Abnormal; Notable for the following:    Hgb urine dipstick LARGE (*)    All other components within normal limits  CBC WITH DIFFERENTIAL/PLATELET  URINE MICROSCOPIC-ADD ON    Imaging Review Ct Renal Stone Study  05/01/2015  CLINICAL DATA:  Left flank pain which began this morning EXAM: CT ABDOMEN AND PELVIS WITHOUT CONTRAST TECHNIQUE: Multidetector CT imaging of the abdomen and pelvis was performed following the standard protocol without IV contrast. COMPARISON:  10/10/2010 FINDINGS: Lower chest:  Normal Hepatobiliary: Lobulated liver contour again identified consistent with possible cirrhosis. Gallbladder appears negative. Tiny sub cm low-attenuation structure dome of the liver on the right, stable. It is likely a tiny cyst. Pancreas: Normal Spleen: Normal Adrenals/Urinary Tract: Adrenal glands are negative. 1-2 mm nonobstructing stone upper pole right kidney. Two tiny punctate nonobstructing left upper pole calculi. Mild dilatation left ureter with mild periureteral inflammation. 3 mm stone within the  bladder lumen adjacent to the orifice of the left ureteral vesical junction. Stomach/Bowel: There is a normal appendix. Small and large bowel appear normal as does the stomach. Vascular/Lymphatic: Minimal calcification right iliac artery Reproductive: Negative Other: No ascites Musculoskeletal: no acute musculoskeletal findings IMPRESSION: 3 mm stone in the bladder adjacent to the orifice of the left ureterovesical junction. Mild left hydroureter and hydronephrosis. Stable lobular contour of the liver. Electronically Signed   By: Skipper Cliche M.D.   On: 05/01/2015 10:49   I have personally reviewed and evaluated these images and lab results as part of my medical decision-making.   EKG Interpretation None      MDM   Final diagnoses:  Renal colic on left side    Patient here for evaluation of left lower quadrant abdominal pain. History and exam is not consistent with torsion. CT  scan obtained that demonstrates a 3 mm ureteral stone. Patient urinated in the department following the CT scan and felt significantly improved and passed an approximately 3 mm stone. Discussed with patient renal colic, home care, outpatient follow-up, return precautions.    Quintella Reichert, MD 05/01/15 [phone]

## 2015-05-01 NOTE — ED Notes (Signed)
Pt placed in room gowned and on the BP and Pulse ox.  Pt. Given a warm blanket.  Pt. Also reports that his lt. Testicle is having pain also, but denies any swelling

## 2015-05-07 ENCOUNTER — Encounter: Payer: Self-pay | Admitting: Internal Medicine

## 2015-05-07 ENCOUNTER — Ambulatory Visit (HOSPITAL_BASED_OUTPATIENT_CLINIC_OR_DEPARTMENT_OTHER)
Admission: RE | Admit: 2015-05-07 | Discharge: 2015-05-07 | Disposition: A | Discharge status: Home / Self Care | Payer: BC Managed Care – PPO | Source: Ambulatory Visit | Attending: Internal Medicine | Admitting: Internal Medicine

## 2015-05-07 ENCOUNTER — Ambulatory Visit (INDEPENDENT_AMBULATORY_CARE_PROVIDER_SITE_OTHER): Payer: BC Managed Care – PPO | Admitting: Internal Medicine

## 2015-05-07 VITALS — BP 126/72 | HR 55 | Temp 97.8°F | Ht 71.0 in | Wt 183.2 lb

## 2015-05-07 DIAGNOSIS — N200 Calculus of kidney: Secondary | ICD-10-CM | POA: Diagnosis not present

## 2015-05-07 DIAGNOSIS — I10 Essential (primary) hypertension: Secondary | ICD-10-CM | POA: Diagnosis not present

## 2015-05-07 DIAGNOSIS — Z09 Encounter for follow-up examination after completed treatment for conditions other than malignant neoplasm: Secondary | ICD-10-CM

## 2015-05-07 NOTE — Progress Notes (Signed)
Subjective:    Patient ID: Gregory Klein, male    DOB: 08-07-1965, 50 y.o.   MRN: [id number]  DOS:  05/07/2015 Type of visit - description :  Interval history:  Went to the ER 05/01/2015, diagnosed with urolithiasis. UA + blood, creatinine 1.4, CBC normal. Creatinine usually runs around 1.2. CT renal: 3 mm stone adjacent to the left ureterovesicular junction, mild hydronephrosis.  Review of Systems While at the emergency room and after he had the CT he passed a stone. Since then is essentially asymptomatic. Denies fever chills. No nausea, vomiting, diarrhea or abdominal pain No dysuria or gross hematuria.  Past Medical History  Diagnosis Date  . 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 Neck pain     local injections x 2, las summer 2011 (Dr.Vortek)  . GERD (gastroesophageal reflux disease)   . Hypertension   . Cirrhosis ([place])     BX NEG 01-2014 NO CIRRHOSIS   . Hemorrhoids   . Acute myocarditis 2015    viral vs d/t strep infection, resolved w/o sequela  . Sleep apnea     on CPAP   . Achilles tendonitis, bilateral 2016    [name], DPM  . Plantar fasciitis, bilateral 2016    [name], DPM  . 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 Inguinal hernia repair    . Knee arthroscopy  6-15    Dr Mardelle Matte  . Left heart catheterization with coronary angiogram N/A 12/29/2013    Procedure: LEFT HEART CATHETERIZATION WITH CORONARY ANGIOGRAM;  Surgeon: [name], MD;  Location: [facility] CATH LAB;  Service: Cardiovascular;  Laterality: N/A;    Social History   Social History  . Marital Status: Single    Spouse Name: N/A  . Number of Children: 0  . Years of Education: N/A   Occupational History  . Microbiologist   .     Social History Main Topics  . Smoking status: Never Smoker   . Smokeless tobacco: Never  Used  . Alcohol Use: No  . Drug Use: No  . Sexual Activity: Not on file   Other Topics Concern  . Not on file   Social History Narrative    Lives by himself        Medication List       This list is accurate as of: 05/07/15  5:14 PM.  Always use your most recent med list.               cetirizine 10 MG chewable tablet  Commonly known as:  ZYRTEC  Chew 10 mg by mouth daily.     losartan 50 MG tablet  Commonly known as:  COZAAR  Take 1 tablet (50 mg total) by mouth daily.     OVER THE COUNTER MEDICATION  Place 1-2 drops into both eyes as needed (for dry eyes). "[place] for Dry Eyes"     psyllium 58.6 % powder  Commonly known as:  METAMUCIL  Take 1 packet by mouth as needed (for constipation). Reported on 05/07/2015     valACYclovir 1000 MG tablet  Commonly known as:  VALTREX  Take 2 tablets (2,000 mg total) by mouth See admin instructions. Take 2 tablets  (2 gms) twice daily for 2 days as needed for  outbreaks           Objective:   Physical Exam BP 126/72 mmHg  Pulse 55  Temp(Src) 97.8 F (36.6 C) (Oral)  Ht 5\' 11"  (1.803 m)  Wt 183 lb 4 oz (83.122 kg)  BMI 25.57 kg/m2  SpO2 99%  General:   Well developed, well nourished . NAD.  HEENT:  Normocephalic . Face symmetric, atraumatic Lungs:  CTA B Normal respiratory effort, no intercostal retractions, no accessory muscle use. Heart: RRR,  no murmur.  no pretibial edema bilaterally  Abdomen:  Not distended, soft, non-tender. No rebound or rigidity. No CVA tenderness Skin: Not pale. Not jaundice Neurologic:  alert & oriented X3.  Speech normal, gait appropriate for age and unassisted Psych--  Cognition and judgment appear intact.  Cooperative with normal attention span and concentration.  Behavior appropriate. No anxious or depressed appearing.       Assessment & Plan:   Assessment>  Prediabetes A1C 5.9 (12-2014) HTN OSA on CPAP H/o cold sores , on valtrex  H/o Acute myocarditis --- 2015  , had a cath, problem related to strep, no sequela H/o Suspected cirrhosis, disproved by a negative biopsy 01/2014 H/o hep B infex 2015 H/o Neck pain, s/p 2 injections 2011 (Dr. Adline Peals) Urolithiasis ER 04-30-25  Plan: Urolithiasis: Single stone noted and a CT, pt passed the stone afterwards. Now asymptomatic. Plan: Drink plenty of fluids, check a KUB.  The pt brought the stone, I recommend him to keep it in case at some point it needs to be analyzed. HTN: Well controlled, has changed his lifestyle, wonders if he needs to stop BP medications. BP today is very good, no change at this time RTC  later this month as scheduled

## 2015-05-07 NOTE — Assessment & Plan Note (Signed)
Urolithiasis: Single stone noted and a CT, pt passed the stone afterwards. Now asymptomatic. Plan: Drink plenty of fluids, check a KUB.  The pt brought the stone, I recommend him to keep it in case at some point it needs to be analyzed. HTN: Well controlled, has changed his lifestyle, wonders if he needs to stop BP medications. BP today is very good, no change at this time RTC  later this month as scheduled

## 2015-05-07 NOTE — Progress Notes (Signed)
Pre visit review using our clinic review tool, if applicable. No additional management support is needed unless otherwise documented below in the visit note. 

## 2015-05-07 NOTE — Patient Instructions (Signed)
 +++  STOP BY THE FIRST FLOOR:  get the XR    Continue drinking plenty of fluids   Call if pain, fever, chills, blood in the urine

## 2015-05-13 ENCOUNTER — Telehealth: Payer: Self-pay | Admitting: Internal Medicine

## 2015-05-13 NOTE — Telephone Encounter (Signed)
Notes Recorded by Colon Branch, MD on 05/07/2015 at 5:07 PM Releasing results to [name] with comments: Gregory Klein, the x-ray showed no stone, great results

## 2015-05-13 NOTE — Telephone Encounter (Signed)
Patient wants results from xray. 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

## 2015-05-19 ENCOUNTER — Encounter: Payer: BC Managed Care – PPO | Admitting: Internal Medicine

## 2015-05-31 ENCOUNTER — Encounter: Payer: Self-pay | Admitting: *Deleted

## 2015-05-31 ENCOUNTER — Telehealth: Payer: Self-pay | Admitting: *Deleted

## 2015-05-31 NOTE — Telephone Encounter (Signed)
Pre-Visit Call completed with patient and chart updated.   Pre-Visit Info documented in Specialty Comments under [employer].    

## 2015-06-01 ENCOUNTER — Encounter: Payer: Self-pay | Admitting: Internal Medicine

## 2015-06-01 ENCOUNTER — Ambulatory Visit (INDEPENDENT_AMBULATORY_CARE_PROVIDER_SITE_OTHER): Payer: BC Managed Care – PPO | Admitting: Internal Medicine

## 2015-06-01 VITALS — BP 122/72 | HR 69 | Temp 98.1°F | Ht 71.0 in | Wt 182.4 lb

## 2015-06-01 DIAGNOSIS — Z09 Encounter for follow-up examination after completed treatment for conditions other than malignant neoplasm: Secondary | ICD-10-CM

## 2015-06-01 DIAGNOSIS — Z125 Encounter for screening for malignant neoplasm of prostate: Secondary | ICD-10-CM

## 2015-06-01 DIAGNOSIS — Z0000 Encounter for general adult medical examination without abnormal findings: Secondary | ICD-10-CM

## 2015-06-01 LAB — BASIC METABOLIC PANEL
BUN: 14 mg/dL (ref 6–23)
CALCIUM: 9.1 mg/dL (ref 8.4–10.5)
CO2: 33 mEq/L — ABNORMAL HIGH (ref 19–32)
CREATININE: 1.11 mg/dL (ref 0.40–1.50)
Chloride: 102 mEq/L (ref 96–112)
GFR: 90.85 mL/min (ref >60.00)
Glucose, Bld: 87 mg/dL (ref 70–99)
Potassium: 3.9 mEq/L (ref 3.5–5.1)
Sodium: 139 mEq/L (ref 135–145)

## 2015-06-01 LAB — LIPID PANEL
Cholesterol: 165 mg/dL (ref 0–200)
HDL: 52.1 mg/dL (ref >39.00)
LDL Cholesterol: 106 mg/dL — ABNORMAL HIGH (ref 0–99)
NONHDL: 113.36
Total CHOL/HDL Ratio: 3
Triglycerides: 37 mg/dL (ref 0.0–149.0)
VLDL: 7.4 mg/dL (ref 0.0–40.0)

## 2015-06-01 LAB — AST: AST: 14 U/L (ref 0–37)

## 2015-06-01 LAB — HEMOGLOBIN A1C: HEMOGLOBIN A1C: 5.8 % (ref 4.6–6.5)

## 2015-06-01 LAB — ALT: ALT: 14 U/L (ref 0–53)

## 2015-06-01 LAB — PSA: PSA: 0.62 ng/mL (ref 0.10–4.00)

## 2015-06-01 NOTE — Patient Instructions (Signed)
GO TO THE LAB :      Get the blood work     GO TO THE FRONT DESK Schedule your next appointment for a  physical When? 1 year   Fasting?  yes     Check the  blood pressure 2 or 3 times a month   Be sure your blood pressure is between 110/65 and  145/85. If it is consistently higher or lower, let me know   I recommend a immunization called Prevnar, please check with your insurance

## 2015-06-01 NOTE — Progress Notes (Signed)
Pre visit review using our clinic review tool, if applicable. No additional management support is needed unless otherwise documented below in the visit note. 

## 2015-06-01 NOTE — Progress Notes (Signed)
Subjective:    Patient ID: Gregory Klein, male    DOB: 1965-07-10, 50 y.o.   MRN: [id number]  DOS:  06/01/2015 Type of visit - description : CPX Interval history: No major concerns  Review of Systems Constitutional: No fever. No chills. No unexplained wt changes. No unusual sweats  HEENT: No dental problems, no ear discharge, no facial swelling, no voice changes. No eye discharge, no eye  redness , no  intolerance to light   Respiratory: No wheezing , no  difficulty breathing. No cough , no mucus production  Cardiovascular: No CP, no leg swelling , no  Palpitations  GI: no nausea, no vomiting, no diarrhea , no  abdominal pain.  No blood in the stools. No dysphagia, no odynophagia    Endocrine: No polyphagia, no polyuria , no polydipsia  GU: No dysuria, gross hematuria, difficulty urinating. No urinary urgency, no frequency.  Musculoskeletal: No joint swellings or unusual aches or pains  Skin: No change in the color of the skin, palor , no  Rash  Allergic, immunologic: No environmental allergies , no  food allergies  Neurological: No dizziness no  syncope. No headaches. No diplopia, no slurred, no slurred speech, no motor deficits, no facial  Numbness  Hematological: No enlarged lymph nodes, no easy bruising , no unusual bleedings  Psychiatry: No suicidal ideas, no hallucinations, no beavior problems, no confusion.  No unusual/severe anxiety, no depression   Past Medical History  Diagnosis Date  . 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 Neck pain     local injections x 2, las summer 2011 (Dr.Vortek)  . GERD (gastroesophageal reflux disease)   . Hypertension   . Cirrhosis ([place])     BX NEG 01-2014 NO CIRRHOSIS   . Hemorrhoids   . Acute myocarditis 2015    viral vs d/t strep infection, resolved w/o sequela  . Sleep apnea     on CPAP   . Achilles tendonitis, bilateral 2016    [name], DPM  . Plantar fasciitis, bilateral 2016    [name], DPM    . 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 Prediabetes 02/22/2015    Past Surgical History  Procedure Laterality Date  . Inguinal hernia repair    . Knee arthroscopy  6-15    Dr Mardelle Matte  . Left heart catheterization with coronary angiogram N/A 12/29/2013    Procedure: LEFT HEART CATHETERIZATION WITH CORONARY ANGIOGRAM;  Surgeon: [name], MD;  Location: [facility] CATH LAB;  Service: Cardiovascular;  Laterality: N/A;    Social History   Social History  . Marital Status: Single    Spouse Name: N/A  . Number of Children: 0  . Years of Education: N/A   Occupational History  . Microbiologist   .     Social History Main Topics  . Smoking status: Never Smoker   . Smokeless tobacco: Never Used  . Alcohol Use: No  . Drug Use: No  . Sexual Activity: Not on file   Other Topics Concern  . Not on file   Social History Narrative    Lives by himself     Family History  Problem Relation Age of Onset  . Coronary artery disease Maternal Grandmother   . Hypertension Maternal Grandmother   . Hypertension Mother   . Stroke Mother  M, GF  . Diabetes Paternal Grandfather   . Colon cancer Neg Hx   . Esophageal cancer Neg Hx   . Kidney disease Neg Hx   . Prostate cancer Maternal Grandfather 75  . Hypertension Father   . Breast cancer Paternal Grandmother   . Pancreatic cancer Paternal Aunt        Medication List       This list is accurate as of: 06/01/15  8:56 PM.  Always use your most recent med list.               cetirizine 10 MG chewable tablet  Commonly known as:  ZYRTEC  Chew 10 mg by mouth daily.     losartan 50 MG tablet  Commonly known as:  COZAAR  Take 1 tablet (50 mg total) by mouth daily.     OVER THE COUNTER MEDICATION  Place 1-2 drops into both eyes as needed (for dry eyes). "[place] for Dry Eyes"     psyllium 58.6 % powder  Commonly known as:   METAMUCIL  Take 1 packet by mouth as needed (for constipation). Reported on 05/07/2015     valACYclovir 1000 MG tablet  Commonly known as:  VALTREX  Take 2 tablets (2,000 mg total) by mouth See admin instructions. Take 2 tablets  (2 gms) twice daily for 2 days as needed for outbreaks           Objective:   Physical Exam BP 122/72 mmHg  Pulse 69  Temp(Src) 98.1 F (36.7 C) (Oral)  Ht 5\' 11"  (1.803 m)  Wt 182 lb 6 oz (82.725 kg)  BMI 25.45 kg/m2  SpO2 98% General:   Well developed, well nourished . NAD.  HEENT:  Normocephalic . Face symmetric, atraumatic Lungs:  CTA B Normal respiratory effort, no intercostal retractions, no accessory muscle use. Heart: RRR,  no murmur.  no pretibial edema bilaterally  Abdomen:  Not distended, soft, non-tender. No rebound or rigidity.  Skin: Not pale. Not jaundice Rectal:  External abnormalities: none. Normal sphincter tone. No rectal masses or tenderness.  Stool brown  Prostate: Was very hard to reach the prostate but it feels normal. no enlargement, nodularity, tenderness, mass, asymmetry or induration.  Neurologic:  alert & oriented X3.  Speech normal, gait appropriate for age and unassisted Psych--  Cognition and judgment appear intact.  Cooperative with normal attention span and concentration.  Behavior appropriate. No anxious or depressed appearing.    Assessment & Plan:    Assessment>  Prediabetes A1C 5.9 (12-2014) HTN OSA on CPAP H/o cold sores , on valtrex  H/o Acute myocarditis --- 2015 , had a cath, problem related to strep, no sequela H/o Suspected cirrhosis, disproved by a negative biopsy 01/2014 H/o hep B infex 2015 H/o Neck pain, s/p 2 injections 2011 (Dr. Adline Peals), occ numbness R thumb, no neck pain as of 05-2015 Urolithiasis ER 04-30-25  Plan: Diabetes: Check A1c HTN: Seems well-controlled, recommend to monitor BPs to 3 times a month, if BPs decreasing due to better lifestyle will consider scale down his  medication Sleep apnea, good compliance with CPAP RTC one year

## 2015-06-01 NOTE — Assessment & Plan Note (Addendum)
Td 09 ; PNA (25)- 12/28/13; Prevnar, see AVS  CCS- 04/17/02; per report "repeat in 5 years" but has  no FH, asx , will rec cscope at age 50 for screening  Prostate cancer screening: Pros and cons discussed, likes to be screened, DRE negative, check a PSA   Doing very well w/ exercise-- diet  EKG: Normal sinus rhythm. Labs: BMP, AST, ALT, FLP, A1c, PSA

## 2015-06-01 NOTE — Assessment & Plan Note (Signed)
Diabetes: Check A1c HTN: Seems well-controlled, recommend to monitor BPs to 3 times a month, if BPs decreasing due to better lifestyle will consider scale down his medication Sleep apnea, good compliance with CPAP RTC one year

## 2015-09-21 ENCOUNTER — Telehealth: Payer: Self-pay | Admitting: Internal Medicine

## 2015-09-21 MED ORDER — LOSARTAN POTASSIUM 50 MG PO TABS: 50.0000 mg | ORAL_TABLET | Freq: Every day | ORAL | 2 refills | Status: DC

## 2015-09-21 NOTE — Telephone Encounter (Signed)
°  Relationship to patient: Self  Can be reached: [phone]   Pharmacy:  [facility], [place] [phone] (Phone) [phone] (Fax)     Reason for call: Refill losartan (COZAAR) 50 MG tablet

## 2015-09-21 NOTE — Telephone Encounter (Signed)
Rx sent 

## 2016-04-06 ENCOUNTER — Telehealth: Payer: Self-pay | Admitting: Internal Medicine

## 2016-04-06 MED ORDER — VALACYCLOVIR HCL 1 G PO TABS: 2000.0000 mg | ORAL_TABLET | Freq: Two times a day (BID) | ORAL | 1 refills | Status: DC | PRN

## 2016-04-06 NOTE — Telephone Encounter (Signed)
Rx sent 

## 2016-04-06 NOTE — Telephone Encounter (Signed)
Self.   Refill for VALTREX     Pharmacy: [facility], [place]

## 2016-06-28 ENCOUNTER — Telehealth: Payer: Self-pay | Admitting: Internal Medicine

## 2016-06-28 MED ORDER — LOSARTAN POTASSIUM 50 MG PO TABS: 50.0000 mg | ORAL_TABLET | Freq: Every day | ORAL | 0 refills | Status: DC

## 2016-06-28 NOTE — Telephone Encounter (Signed)
30 day supply sent to [facility], no further refills w/o appt. Last OV 05/2015.

## 2016-06-28 NOTE — Telephone Encounter (Signed)
Caller name: Relationship to patient: Self Can be reached: [phone]  Pharmacy: [facility], [place]  Reason for call: Refill losartan (COZAAR) 50 MG tablet

## 2016-07-12 ENCOUNTER — Encounter: Payer: Self-pay | Admitting: Medical

## 2016-07-12 ENCOUNTER — Telehealth: Payer: Self-pay | Admitting: Medical

## 2016-07-12 ENCOUNTER — Ambulatory Visit (INDEPENDENT_AMBULATORY_CARE_PROVIDER_SITE_OTHER): Payer: BC Managed Care – PPO | Admitting: Medical

## 2016-07-12 VITALS — BP 130/80 | HR 68 | Temp 98.3°F | Resp 16 | Ht 71.0 in | Wt 192.6 lb

## 2016-07-12 DIAGNOSIS — I10 Essential (primary) hypertension: Secondary | ICD-10-CM

## 2016-07-12 DIAGNOSIS — R739 Hyperglycemia, unspecified: Secondary | ICD-10-CM

## 2016-07-12 DIAGNOSIS — T7840XA Allergy, unspecified, initial encounter: Secondary | ICD-10-CM | POA: Diagnosis not present

## 2016-07-12 LAB — COMPREHENSIVE METABOLIC PANEL
ALT: 44 U/L (ref 0–53)
AST: 33 U/L (ref 0–37)
Albumin: 4.2 g/dL (ref 3.5–5.2)
Alkaline Phosphatase: 56 U/L (ref 39–117)
BUN: 18 mg/dL (ref 6–23)
CHLORIDE: 103 meq/L (ref 96–112)
CO2: 31 meq/L (ref 19–32)
CREATININE: 1.19 mg/dL (ref 0.40–1.50)
Calcium: 9.2 mg/dL (ref 8.4–10.5)
GFR: 83.45 mL/min (ref >60.00)
Glucose, Bld: 90 mg/dL (ref 70–99)
POTASSIUM: 4.3 meq/L (ref 3.5–5.1)
SODIUM: 138 meq/L (ref 135–145)
Total Bilirubin: 1.1 mg/dL (ref 0.2–1.2)
Total Protein: 7.3 g/dL (ref 6.0–8.3)

## 2016-07-12 MED ORDER — HYDROXYZINE HCL 10 MG PO TABS: 10.0000 mg | ORAL_TABLET | Freq: Three times a day (TID) | ORAL | 0 refills | Status: DC | PRN

## 2016-07-12 MED ORDER — TRIAMCINOLONE ACETONIDE 0.5 % EX OINT: 1.0000 "application " | TOPICAL_OINTMENT | Freq: Two times a day (BID) | CUTANEOUS | 0 refills | Status: DC

## 2016-07-12 NOTE — Telephone Encounter (Signed)
Pt wanted a1c. I forgot to order. Will you call and apologize. Looks like he will need to come in for another draw. Order already placed.  Ordered cmp to check his kidney function after he started asking about refill about bp med. Then accidentally forgot to order a1c. Again sorry/applogize.

## 2016-07-12 NOTE — Progress Notes (Signed)
Subjective:    Patient ID: Gregory Klein, male    DOB: 01-Feb-1966, 51 y.o.   MRN: [id number]  HPI   Pt in with some faint forearm rash   Faint redness. Mild itch. This is present for about 5 days. The level of redness is staying the same. No vesicle eruption. Itch not keeping him up. Just occasional.   Pt had concern for bed bugs. No bite marks.  Some small bug in his office. Told not chiggers. 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   Pt at one point saw these red bugs all over his desk.    Review of Systems  Constitutional: Negative for chills, fatigue and fever.  HENT: Negative for congestion.   Respiratory: Negative for cough, choking and shortness of breath.   Cardiovascular: Negative for chest pain and palpitations.  Gastrointestinal: Negative for abdominal pain and anal bleeding.  Genitourinary: Negative for dysuria, hematuria, penile swelling and scrotal swelling.  Musculoskeletal: Negative for back pain.  Skin: Positive for rash.  Neurological: Negative for dizziness and light-headedness.  Hematological: Negative for adenopathy. Does not bruise/bleed easily.  Psychiatric/Behavioral: Negative for behavioral problems and confusion.   Past Medical History:  Diagnosis Date  . Achilles tendonitis, bilateral 2016   [name], DPM  . Acute myocarditis 2015   viral vs d/t strep infection, resolved w/o sequela  . Cirrhosis ([place])    BX NEG 01-2014 NO CIRRHOSIS   . 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 GERD (gastroesophageal reflux disease)   . Hemorrhoids   . Hepatitis B infection    hx of dx 2005 aprox  . Hypertension   . Neck pain    local injections x 2, las summer 2011 (Dr.Vortek)  . Plantar fasciitis, bilateral 2016   [name], DPM  . Prediabetes 02/22/2015  . Sleep apnea    on CPAP      Social History   Social History  . Marital status: Single   Spouse name: N/A  . Number of children: 0  . 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 Smoking status: Never Smoker  . Smokeless tobacco: Never Used  . Alcohol use No  . Drug use: No  . Sexual activity: Not on file   Other Topics Concern  . Not on file   Social History Narrative    Lives by himself    Past Surgical History:  Procedure Laterality Date  . INGUINAL HERNIA REPAIR    . KNEE ARTHROSCOPY  6-15   Dr Mardelle Matte  . LEFT HEART CATHETERIZATION WITH CORONARY ANGIOGRAM N/A 12/29/2013   Procedure: LEFT HEART CATHETERIZATION WITH CORONARY ANGIOGRAM;  Surgeon: [name], MD;  Location: [facility] CATH LAB;  Service: Cardiovascular;  Laterality: N/A;    Family History  Problem Relation Age of Onset  . Coronary artery disease Maternal Grandmother   . Hypertension Maternal Grandmother   . Hypertension Mother   . Stroke Mother        M, Lillia Corporal  . Diabetes Paternal Grandfather   . Colon cancer Neg Hx   . Esophageal cancer Neg Hx   . Kidney disease Neg Hx   . Prostate cancer Maternal Grandfather 84  . Hypertension Father   . Breast cancer Paternal Grandmother   . Pancreatic cancer Paternal Aunt  Allergies  Allergen Reactions  . Amoxicillin Rash    Current Outpatient Prescriptions on File Prior to Visit  Medication Sig Dispense Refill  . cetirizine (ZYRTEC) 10 MG chewable tablet Chew 10 mg by mouth daily.    Marland Kitchen losartan (COZAAR) 50 MG tablet Take 1 tablet (50 mg total) by mouth daily. 30 tablet 0  . OVER THE COUNTER MEDICATION Place 1-2 drops into both eyes as needed (for dry eyes). "[facility] for Dry Eyes"    . psyllium (METAMUCIL) 58.6 % powder Take 1 packet by mouth as needed (for constipation). Reported on 05/07/2015    . valACYclovir (VALTREX) 1000 MG tablet Take 2 tablets (2,000 mg total) by mouth 2 (two) times daily as needed (outbreaks). 30 tablet 1   No current  facility-administered medications on file prior to visit.     BP (!) 124/93 (BP Location: Left Arm, Patient Position: Sitting, Cuff Size: Large)   Pulse 68   Temp 98.3 F (36.8 C) (Oral)   Resp 16   Ht 5\' 11"  (1.803 m)   Wt 192 lb 9.6 oz (87.4 kg)   SpO2 100%   BMI 26.86 kg/m       Objective:   Physical Exam  General- No acute distress. Pleasant patient. Neck- Full range of motion, no jvd Lungs- Clear, even and unlabored. Heart- regular rate and rhythm. Neurologic- CNII- XII grossly intact.  Skin- forearm- just medial to old scar very faint red rash. About 2 cm x 3 cm area. No obvious warmth and induration. No vesicles or papules.      Assessment & Plan:  You appear to have mild allergic reaction. Exact cause not determined. May be exposure to bug in your office. Will rx topical kenalog. Also hydroxyzine for itching.  If area get more red, warm to touch or tender let us know. Infection can occur but not present today.  Follow up in 5 days or as needed(you could my chart me and I could let you know if you need official follow up.  Kohner Orlick, Percell Miller, PA-C

## 2016-07-12 NOTE — Patient Instructions (Signed)
You appear to have mild allergic reaction. Exact cause not determined. May be exposure to bug in your office. Will rx topical kenalog. Also hydroxyzine for itching.  If area get more red, warm to touch or tender let us know. Infection can occur but not present today.  Follow up in 5 days or as needed(you could my chart me and I could let you know if you need official follow up.

## 2016-07-13 ENCOUNTER — Other Ambulatory Visit: Payer: BC Managed Care – PPO

## 2016-07-13 LAB — HEMOGLOBIN A1C: Hgb A1c MFr Bld: 5.9 % (ref 4.6–6.5)

## 2016-07-13 NOTE — Addendum Note (Signed)
Addended by: Peggyann Shoals on: 07/13/2016 09:21 AM   Modules accepted: Orders

## 2016-07-13 NOTE — Telephone Encounter (Signed)
Pt scheduled to come back this morning for A1c.

## 2016-07-27 ENCOUNTER — Telehealth: Payer: Self-pay | Admitting: Internal Medicine

## 2016-07-27 MED ORDER — LOSARTAN POTASSIUM 50 MG PO TABS: 50.0000 mg | ORAL_TABLET | Freq: Every day | ORAL | 0 refills | Status: DC

## 2016-07-27 NOTE — Telephone Encounter (Signed)
30 day supply sent to [facility].

## 2016-07-27 NOTE — Telephone Encounter (Signed)
Caller name: Relationship to patient: Self Can be reached: [phone]  Pharmacy:  [facility], [place] [phone] (Phone) [phone] (Fax)     Reason for call: Request refill on losartan (COZAAR) 50 MG tablet [[id number]]

## 2016-08-07 ENCOUNTER — Encounter: Payer: BC Managed Care – PPO | Admitting: Internal Medicine

## 2016-08-28 ENCOUNTER — Telehealth: Payer: Self-pay | Admitting: Internal Medicine

## 2016-08-28 MED ORDER — LOSARTAN POTASSIUM 50 MG PO TABS: 50.0000 mg | ORAL_TABLET | Freq: Every day | ORAL | 1 refills | Status: DC

## 2016-08-28 NOTE — Telephone Encounter (Signed)
Rx sent 

## 2016-08-28 NOTE — Telephone Encounter (Signed)
Caller name: Abdikadir Relationship to patient: self Can be reached: [phone] Pharmacy: [facility], [place]  Reason for call: Pt called for losartan refill. Has 6 left but going out of town Wednesday and will be gone for a week. Pt has cpe scheduled for 10/12/16.

## 2016-09-06 ENCOUNTER — Encounter: Payer: BC Managed Care – PPO | Admitting: Internal Medicine

## 2016-09-28 ENCOUNTER — Encounter: Payer: BC Managed Care – PPO | Admitting: Internal Medicine

## 2016-10-12 ENCOUNTER — Ambulatory Visit (INDEPENDENT_AMBULATORY_CARE_PROVIDER_SITE_OTHER): Payer: BC Managed Care – PPO | Admitting: Internal Medicine

## 2016-10-12 VITALS — BP 126/70 | HR 82 | Temp 98.0°F | Resp 14 | Ht 71.0 in | Wt 197.0 lb

## 2016-10-12 DIAGNOSIS — Z0000 Encounter for general adult medical examination without abnormal findings: Secondary | ICD-10-CM

## 2016-10-12 LAB — LIPID PANEL
CHOL/HDL RATIO: 3
CHOLESTEROL: 166 mg/dL (ref 0–200)
HDL: 50 mg/dL (ref >39.00)
LDL Cholesterol: 108 mg/dL — ABNORMAL HIGH (ref 0–99)
NonHDL: 115.94
TRIGLYCERIDES: 42 mg/dL (ref 0.0–149.0)
VLDL: 8.4 mg/dL (ref 0.0–40.0)

## 2016-10-12 LAB — CBC WITH DIFFERENTIAL/PLATELET
BASOS ABS: 0 10*3/uL (ref 0.0–0.1)
Basophils Relative: 0.6 % (ref 0.0–3.0)
EOS PCT: 0.3 % (ref 0.0–5.0)
Eosinophils Absolute: 0 10*3/uL (ref 0.0–0.7)
HEMATOCRIT: 42.8 % (ref 39.0–52.0)
Hemoglobin: 14 g/dL (ref 13.0–17.0)
LYMPHS PCT: 24.7 % (ref 12.0–46.0)
Lymphs Abs: 1.2 10*3/uL (ref 0.7–4.0)
MCHC: 32.6 g/dL (ref 30.0–36.0)
MCV: 83.6 fl (ref 78.0–100.0)
MONOS PCT: 9.9 % (ref 3.0–12.0)
Monocytes Absolute: 0.5 10*3/uL (ref 0.1–1.0)
NEUTROS ABS: 3 10*3/uL (ref 1.4–7.7)
Neutrophils Relative %: 64.5 % (ref 43.0–77.0)
Platelets: 237 10*3/uL (ref 150.0–400.0)
RBC: 5.12 Mil/uL (ref 4.22–5.81)
RDW: 15 % (ref 11.5–15.5)
WBC: 4.7 10*3/uL (ref 4.0–10.5)

## 2016-10-12 LAB — PSA: PSA: 0.73 ng/mL (ref 0.10–4.00)

## 2016-10-12 LAB — TSH: TSH: 2.55 u[IU]/mL (ref 0.35–4.50)

## 2016-10-12 MED ORDER — LOSARTAN POTASSIUM 50 MG PO TABS: 50.0000 mg | ORAL_TABLET | Freq: Every day | ORAL | 3 refills | Status: DC

## 2016-10-12 NOTE — Progress Notes (Signed)
Pre visit review using our clinic review tool, if applicable. No additional management support is needed unless otherwise documented below in the visit note. 

## 2016-10-12 NOTE — Assessment & Plan Note (Signed)
-  Td 09 ; PNA (25)- 12/28/13  -CCS- 04/17/02; per report "repeat in 5 years" but has  no FH, asx , discussed early screening before age 51, but we both agree to reassess next year. -Prostate cancer screening:  DRE- PSA wnl 2017; request labs , will do   -diet,exercise: Discussed, he does very well when he exercises daily. -Labs: FLP, CBC, TSH, PSA

## 2016-10-12 NOTE — Assessment & Plan Note (Signed)
Prediabetes: Last A1c satisfactory. Diet and exercise discussed HTN: Continue with losartan, last BMP satisfactory, monitor BPs OSA: Has not use his CPAP lately, encourage daily use. RTC 1 year

## 2016-10-12 NOTE — Patient Instructions (Signed)
GO TO THE LAB : Get the blood work     GO TO THE FRONT DESK Schedule your next appointment for a  physical exam in one year   Check the  blood pressure   monthly  Be sure your blood pressure is between 110/65 and  145/85. If it is consistently higher or lower, let me know

## 2016-10-12 NOTE — Progress Notes (Signed)
Subjective:    Patient ID: Gregory Klein, male    DOB: 17-Jan-1966, 51 y.o.   MRN: [id number]  DOS:  10/12/2016 Type of visit - description : cpx Interval history: Feeling well, no major concerns.   Review of Systems   Other than above, a 14 point review of systems is negative     Past Medical History:  Diagnosis Date  . Achilles tendonitis, bilateral 2016   [name], DPM  . Acute myocarditis 2015   viral vs d/t strep infection, resolved w/o sequela  . Cirrhosis ([place])    BX NEG 01-2014 NO CIRRHOSIS   . 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 GERD (gastroesophageal reflux disease)   . Hemorrhoids    hx of  . Hepatitis B infection    hx of dx 2005 aprox  . Hyperglycemia 02/22/2015  . Hypertension   . Neck pain    local injections x 2, las summer 2011 (Dr.Vortek)  . Plantar fasciitis, bilateral 2016   [name], DPM  . Sleep apnea    on CPAP     Past Surgical History:  Procedure Laterality Date  . INGUINAL HERNIA REPAIR    . KNEE ARTHROSCOPY  6-15   Dr Mardelle Matte  . LEFT HEART CATHETERIZATION WITH CORONARY ANGIOGRAM N/A 12/29/2013   Procedure: LEFT HEART CATHETERIZATION WITH CORONARY ANGIOGRAM;  Surgeon: [name], MD;  Location: [facility] CATH LAB;  Service: Cardiovascular;  Laterality: N/A;    Social History   Social History  . Marital status: Single    Spouse name: N/A  . Number of children: 0  . 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 Smoking status: Never Smoker  . Smokeless tobacco: Never Used  . Alcohol use No  . Drug use: No  . Sexual activity: Not on file   Other Topics Concern  . Not on file   Social History Narrative    Lives by himself   Family History  Problem Relation Age of Onset  . Hypertension Mother   . Stroke Mother        M, Lillia Corporal  .  Hypertension Father   . Coronary artery disease Maternal Grandmother   . Hypertension Maternal Grandmother   . Diabetes Paternal Grandfather   . Prostate cancer Maternal Grandfather 30  . Breast cancer Paternal Grandmother   . Pancreatic cancer Paternal Aunt   . Colon cancer Neg Hx   . Esophageal cancer Neg Hx   . Kidney disease Neg Hx       Allergies as of 10/12/2016      Reactions   Amoxicillin Rash      Medication List       Accurate as of 10/12/16  1:33 PM. Always use your most recent med list.          losartan 50 MG tablet Commonly known as:  COZAAR Take 1 tablet (50 mg total) by mouth daily.   OVER THE COUNTER MEDICATION Place 1-2 drops into both eyes as needed (for dry eyes). "[place] for Dry Eyes"   psyllium 58.6 % powder Commonly known as:  METAMUCIL Take 1 packet by mouth as needed (for constipation). Reported on 05/07/2015   valACYclovir 1000 MG tablet Commonly known as:  VALTREX Take 2 tablets (2,000 mg  total) by mouth 2 (two) times daily as needed (outbreaks).          Objective:   Physical Exam BP 126/70 (BP Location: Left Arm, Patient Position: Sitting, Cuff Size: Normal)   Pulse 82   Temp 98 F (36.7 C) (Oral)   Resp 14   Ht 5\' 11"  (1.803 m)   Wt 197 lb (89.4 kg)   SpO2 97%   BMI 27.48 kg/m   General:   Well developed, well nourished . NAD.  Neck: No  thyromegaly  HEENT:  Normocephalic . Face symmetric, atraumatic Lungs:  CTA B Normal respiratory effort, no intercostal retractions, no accessory muscle use. Heart: RRR,  no murmur.  No pretibial edema bilaterally  Abdomen:  Not distended, soft, non-tender. No rebound or rigidity.   Skin: Exposed areas without rash. Not pale. Not jaundice Neurologic:  alert & oriented X3.  Speech normal, gait appropriate for age and unassisted Strength symmetric and appropriate for age.  Psych: Cognition and judgment appear intact.  Cooperative with normal attention span and concentration.    Behavior appropriate. No anxious or depressed appearing.    Assessment & Plan:  Assessment>  Prediabetes A1C 5.9 (12-2014) HTN OSA on CPAP H/o cold sores , on valtrex  H/o Acute myocarditis --- 2015 , had a cath, problem related to strep, no sequela H/o Suspected cirrhosis, disproved by a negative biopsy 01/2014 H/o hep B infex 2015 H/o Neck pain, s/p 2 injections 2011 (Dr. Adline Peals), occ numbness R thumb, no neck pain as of 05-2015 Urolithiasis ER 04-30-25  Plan: Prediabetes: Last A1c satisfactory. Diet and exercise discussed HTN: Continue with losartan, last BMP satisfactory, monitor BPs OSA: Has not use his CPAP lately, encourage daily use. RTC 1 year

## 2017-04-03 ENCOUNTER — Encounter: Payer: Self-pay | Admitting: Gastroenterology

## 2017-05-16 ENCOUNTER — Ambulatory Visit: Payer: BC Managed Care – PPO | Admitting: Internal Medicine

## 2017-05-16 ENCOUNTER — Encounter: Payer: Self-pay | Admitting: Internal Medicine

## 2017-05-16 VITALS — BP 122/68 | HR 77 | Temp 98.0°F | Resp 14 | Ht 71.0 in | Wt 201.2 lb

## 2017-05-16 DIAGNOSIS — K625 Hemorrhage of anus and rectum: Secondary | ICD-10-CM

## 2017-05-16 DIAGNOSIS — R739 Hyperglycemia, unspecified: Secondary | ICD-10-CM

## 2017-05-16 LAB — CBC WITH DIFFERENTIAL/PLATELET
BASOS ABS: 0 10*3/uL (ref 0.0–0.1)
Basophils Relative: 0.2 % (ref 0.0–3.0)
Eosinophils Absolute: 0 10*3/uL (ref 0.0–0.7)
Eosinophils Relative: 0.1 % (ref 0.0–5.0)
HCT: 42.3 % (ref 39.0–52.0)
Hemoglobin: 13.8 g/dL (ref 13.0–17.0)
LYMPHS ABS: 2 10*3/uL (ref 0.7–4.0)
Lymphocytes Relative: 24.8 % (ref 12.0–46.0)
MCHC: 32.6 g/dL (ref 30.0–36.0)
MCV: 82.7 fl (ref 78.0–100.0)
MONO ABS: 0.6 10*3/uL (ref 0.1–1.0)
MONOS PCT: 7.5 % (ref 3.0–12.0)
NEUTROS PCT: 67.4 % (ref 43.0–77.0)
Neutro Abs: 5.3 10*3/uL (ref 1.4–7.7)
Platelets: 272 10*3/uL (ref 150.0–400.0)
RBC: 5.12 Mil/uL (ref 4.22–5.81)
RDW: 14.8 % (ref 11.5–15.5)
WBC: 7.9 10*3/uL (ref 4.0–10.5)

## 2017-05-16 LAB — HEMOGLOBIN A1C: HEMOGLOBIN A1C: 6 % (ref 4.6–6.5)

## 2017-05-16 NOTE — Assessment & Plan Note (Signed)
Pre-diabetes: Check a A1c, encourage a healthier lifestyle.  Information provided, see AVS Red blood per rectum:  As described above, most likely distal etiology and probably related to hemorrhoids. Had a colonoscopy in 2004, no FH of colon cancer.  He is due for a colonoscopy per GI letter, recommend to proceed although not on urgent basis.  Check a CBC to be sure there has not been chronic blood loss. RTC CPX 09-2017.

## 2017-05-16 NOTE — Progress Notes (Signed)
Pre visit review using our clinic review tool, if applicable. No additional management support is needed unless otherwise documented below in the visit note. 

## 2017-05-16 NOTE — Patient Instructions (Signed)
GO TO THE LAB : Get the blood work     GO TO THE FRONT DESK Schedule your next appointment for a physical exam by 09-2017  Proceed with your colonoscopy.  Call anytime if you see a lot of blood in the stools.   GENERAL INFORMATION ABOUT HEALTHY EATING: The American Heart Association, www.heart.org  Check the "Life's Simple 7" from the [facility] The American diabetes Association www.diabetes.org  "[facility] [place]" book

## 2017-05-16 NOTE — Progress Notes (Signed)
Subjective:    Patient ID: Gregory Klein, male    DOB: 03-11-1965, 52 y.o.   MRN: [id number]  DOS:  05/16/2017 Type of visit - description : Here for a checkup Interval history: History of hyperglycemia, requests a A1C, admits  he is not doing well with diet and exercise Also, yesterday had a bowel movement and saw red blood on top of the stools and when he wiped.  At the time did not have any pain or itching at the anal area and he did not have to particularly strain. Today, saw again a small amount of red blood during a bowel movement, patient is not completely sure if the blood was mixed with the stools. Recently was diagnosed with allergies, went to urgent care, finishing a round of prednisone  Review of Systems  Denies nausea, vomiting. No abdominal pain.  Past Medical History:  Diagnosis Date  . Achilles tendonitis, bilateral 2016   [name], DPM  . Acute myocarditis 2015   viral vs d/t strep infection, resolved w/o sequela  . Cirrhosis ([place])    BX NEG 01-2014 NO CIRRHOSIS   . 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 GERD (gastroesophageal reflux disease)   . Hemorrhoids    hx of  . Hepatitis B infection    hx of dx 2005 aprox  . Hyperglycemia 02/22/2015  . Hypertension   . Neck pain    local injections x 2, las summer 2011 (Dr.Vortek)  . Plantar fasciitis, bilateral 2016   [name], DPM  . Sleep apnea    on CPAP     Past Surgical History:  Procedure Laterality Date  . INGUINAL HERNIA REPAIR    . KNEE ARTHROSCOPY  6-15   Dr Mardelle Matte  . LEFT HEART CATHETERIZATION WITH CORONARY ANGIOGRAM N/A 12/29/2013   Procedure: LEFT HEART CATHETERIZATION WITH CORONARY ANGIOGRAM;  Surgeon: [name], MD;  Location: [facility] CATH LAB;  Service: Cardiovascular;  Laterality: N/A;    Social History   Socioeconomic History  . Marital status: Single    Spouse name: Not on file  .  Number of children: 0  . Years of education: Not on file  . Highest education level: Not on file  Social Needs  . Financial resource strain: Not on file  . Food insecurity - worry: Not on file  . Food insecurity - inability: Not on file  . Transportation needs - medical: Not on file  . Transportation needs - non-medical: Not on file  Occupational History  . Occupation: Pension scheme manager: [employer]  Tobacco Use  . Smoking status: Never Smoker  . Smokeless tobacco: Never Used  Substance and Sexual Activity  . Alcohol use: No    Alcohol/week: 0.0 oz  . Drug use: No  . Sexual activity: Not on file  Other Topics Concern  . Not on file  Social History Narrative    Lives by himself      Allergies as of 05/16/2017      Reactions   Amoxicillin Rash      Medication List        Accurate as of 05/16/17  3:00 PM. Always use your most recent med list.          losartan 50 MG tablet Commonly known as:  COZAAR Take 1 tablet (50 mg total) by mouth daily.   OVER THE  COUNTER MEDICATION Place 1-2 drops into both eyes as needed (for dry eyes). "[place] for Dry Eyes"   psyllium 58.6 % powder Commonly known as:  METAMUCIL Take 1 packet by mouth as needed (for constipation). Reported on 05/07/2015   valACYclovir 1000 MG tablet Commonly known as:  VALTREX Take 2 tablets (2,000 mg total) by mouth 2 (two) times daily as needed (outbreaks).          Objective:   Physical Exam BP 122/68 (BP Location: Left Arm, Patient Position: Sitting, Cuff Size: Small)   Pulse 77   Temp 98 F (36.7 C) (Oral)   Resp 14   Ht 5\' 11"  (1.803 m)   Wt 201 lb 4 oz (91.3 kg)   SpO2 97%   BMI 28.07 kg/m   General:   Well developed, well nourished . NAD.  HEENT:  Normocephalic . Face symmetric, atraumatic  Abdomen:  Not distended, soft, non-tender. No rebound or rigidity.   Rectal: External abnormalities: none. slt  high sphincter tone. No rectal masses or  tenderness.  Brown stools Prostate: Prostate gland firm and smooth, no enlargement, nodularity, tenderness, mass, asymmetry or induration Anoscopy: + Several small internal hemorrhoids with no overt bleeding or evidence of recent bleeding. Skin: Not pale. Not jaundice Neurologic:  alert & oriented X3.  Speech normal, gait appropriate for age and unassisted Psych--  Cognition and judgment appear intact.  Cooperative with normal attention span and concentration.  Behavior appropriate. No anxious or depressed appearing.     Assessment & Plan:    Assessment>  Prediabetes A1C 5.9 (12-2014) HTN OSA on CPAP H/o cold sores , on valtrex  H/o Acute myocarditis --- 2015 , had a cath, problem related to strep, no sequela H/o Suspected cirrhosis, disproved by a negative biopsy 01/2014 H/o hep B infex 2015 H/o Neck pain, s/p 2 injections 2011 (Dr. Adline Peals), occ numbness R thumb, no neck pain as of 05-2015 Urolithiasis ER 04-30-25  Plan: Pre-diabetes: Check a A1c, encourage a healthier lifestyle.  Information provided, see AVS Red blood per rectum:  As described above, most likely distal etiology and probably related to hemorrhoids. Had a colonoscopy in 2004, no FH of colon cancer.  He is due for a colonoscopy per GI letter, recommend to proceed although not on urgent basis.  Check a CBC to be sure there has not been chronic blood loss. RTC CPX 09-2017.

## 2017-05-23 ENCOUNTER — Ambulatory Visit: Payer: BC Managed Care – PPO | Admitting: Internal Medicine

## 2017-06-20 ENCOUNTER — Encounter: Payer: Self-pay | Admitting: Gastroenterology

## 2017-08-09 ENCOUNTER — Ambulatory Visit (AMBULATORY_SURGERY_CENTER): Payer: Self-pay

## 2017-08-09 ENCOUNTER — Other Ambulatory Visit: Payer: Self-pay

## 2017-08-09 VITALS — Ht 71.0 in | Wt 204.0 lb

## 2017-08-09 DIAGNOSIS — Z1211 Encounter for screening for malignant neoplasm of colon: Secondary | ICD-10-CM

## 2017-08-09 MED ORDER — NA SULFATE-K SULFATE-MG SULF 17.5-3.13-1.6 GM/177ML PO SOLN: 1.0000 | Freq: Once | ORAL | 0 refills | Status: AC

## 2017-08-09 NOTE — Progress Notes (Signed)
Denies allergies to eggs or soy products. Denies complication of anesthesia or sedation. Denies use of weight loss medication. Denies use of O2.   Emmi instructions declined.  

## 2017-08-10 ENCOUNTER — Encounter: Payer: Self-pay | Admitting: Gastroenterology

## 2017-08-17 ENCOUNTER — Other Ambulatory Visit: Payer: Self-pay | Admitting: Internal Medicine

## 2017-08-17 NOTE — Telephone Encounter (Signed)
Copied from [place] [id number]. Topic: Quick Communication - Rx Refill/Question >> Aug 17, 2017  1:43 PM Selinda Flavin B, NT wrote: Medication: valACYclovir (VALTREX) 1000 MG tablet  Has the patient contacted their pharmacy? Yes.   (Agent: If no, request that the patient contact the pharmacy for the refill.) (Agent: If yes, when and what did the pharmacy advise?)  Preferred Pharmacy (with phone number or street name): [id number] - [place], [place] - [address] [place]: Please be advised that RX refills may take up to 3 business days. We ask that you follow-up with your pharmacy.

## 2017-08-18 ENCOUNTER — Other Ambulatory Visit: Payer: Self-pay | Admitting: Internal Medicine

## 2017-08-20 NOTE — Telephone Encounter (Signed)
Refill of VAltrex  LOV 05/16/17  Dr. [name]  LRF 04/06/16  #30  1 refill   [id number] - [place], [place] - [address] AT [place]

## 2017-08-20 NOTE — Telephone Encounter (Signed)
Refills sent via earlier request from pharmacy.

## 2017-08-23 ENCOUNTER — Ambulatory Visit (AMBULATORY_SURGERY_CENTER): Payer: BC Managed Care – PPO | Admitting: Gastroenterology

## 2017-08-23 ENCOUNTER — Encounter: Payer: Self-pay | Admitting: Gastroenterology

## 2017-08-23 ENCOUNTER — Other Ambulatory Visit: Payer: Self-pay

## 2017-08-23 VITALS — BP 102/67 | HR 61 | Temp 98.6°F | Resp 12 | Ht 71.0 in | Wt 204.0 lb

## 2017-08-23 DIAGNOSIS — D122 Benign neoplasm of ascending colon: Secondary | ICD-10-CM

## 2017-08-23 DIAGNOSIS — Z1211 Encounter for screening for malignant neoplasm of colon: Secondary | ICD-10-CM | POA: Diagnosis present

## 2017-08-23 MED ORDER — SODIUM CHLORIDE 0.9 % IV SOLN: 500.0000 mL | Freq: Once | INTRAVENOUS | Status: DC

## 2017-08-23 NOTE — Op Note (Signed)
[place] Patient Name: Gregory Klein Procedure Date: 08/23/2017 1:43 PM MRN: [id number] Endoscopist: Ladene Artist , MD Age: 52 Referring MD:  Date of Birth: [birth year] Gender: Male Account #: [id number] Procedure:                Colonoscopy Indications:              Screening for colorectal malignant neoplasm Medicines:                Monitored Anesthesia Care Procedure:                Pre-Anesthesia Assessment:                           - Prior to the procedure, a History and Physical                            was performed, and patient medications and                            allergies were reviewed. The patient's tolerance of                            previous anesthesia was also reviewed. The risks                            and benefits of the procedure and the sedation                            options and risks were discussed with the patient.                            All questions were answered, and informed consent                            was obtained. Prior Anticoagulants: The patient has                            taken no previous anticoagulant or antiplatelet                            agents. ASA Grade Assessment: II - A patient with                            mild systemic disease. After reviewing the risks                            and benefits, the patient was deemed in                            satisfactory condition to undergo the procedure.                           After obtaining informed consent, the colonoscope  was passed under direct vision. Throughout the                            procedure, the patient's blood pressure, pulse, and                            oxygen saturations were monitored continuously. The                            Colonoscope was introduced through the anus and                            advanced to the the cecum, identified by                            appendiceal orifice and  ileocecal valve. The                            ileocecal valve, appendiceal orifice, and rectum                            were photographed. The quality of the bowel                            preparation was excellent. The colonoscopy was                            performed without difficulty. The patient tolerated                            the procedure well. Scope In: 1:53:12 PM Scope Out: 2:07:55 PM Scope Withdrawal Time: 0 hours 10 minutes 59 seconds  Total Procedure Duration: 0 hours 14 minutes 43 seconds  Findings:                 The perianal and digital rectal examinations were                            normal.                           A 6 mm polyp was found in the ascending colon. The                            polyp was sessile. The polyp was removed with a                            cold snare. Resection and retrieval were complete.                           Internal hemorrhoids were found during                            retroflexion. The hemorrhoids were small and Grade  I (internal hemorrhoids that do not prolapse).                           The exam was otherwise without abnormality on                            direct and retroflexion views. Complications:            No immediate complications. Estimated blood loss:                            None. Estimated Blood Loss:     Estimated blood loss: none. Impression:               - One 6 mm polyp in the ascending colon, removed                            with a cold snare. Resected and retrieved.                           - Internal hemorrhoids.                           - The examination was otherwise normal on direct                            and retroflexion views. Recommendation:           - Repeat colonoscopy in 5 years for surveillance if                            polyp is precancerous, otherwise 10 years.                           - Patient has a contact number available for                             emergencies. The signs and symptoms of potential                            delayed complications were discussed with the                            patient. Return to normal activities tomorrow.                            Written discharge instructions were provided to the                            patient.                           - Resume previous diet.                           - Continue present medications.                           -  Await pathology results. Ladene Artist, MD 08/23/2017 2:10:44 PM This report has been signed electronically.

## 2017-08-23 NOTE — Patient Instructions (Signed)
*   handout on hemorrhoids and polyps given*  YOU HAD AN ENDOSCOPIC PROCEDURE TODAY AT [place]:   Refer to the procedure report that was given to you for any specific questions about what was found during the examination.  If the procedure report does not answer your questions, please call your gastroenterologist to clarify.  If you requested that your care partner not be given the details of your procedure findings, then the procedure report has been included in a sealed envelope for you to review at your convenience later.  YOU SHOULD EXPECT: Some feelings of bloating in the abdomen. Passage of more gas than usual.  Walking can help get rid of the air that was put into your GI tract during the procedure and reduce the bloating. If you had a lower endoscopy (such as a colonoscopy or flexible sigmoidoscopy) you may notice spotting of blood in your stool or on the toilet paper. If you underwent a bowel prep for your procedure, you may not have a normal bowel movement for a few days.  Please Note:  You might notice some irritation and congestion in your nose or some drainage.  This is from the oxygen used during your procedure.  There is no need for concern and it should clear up in a day or so.  SYMPTOMS TO REPORT IMMEDIATELY:   Following lower endoscopy (colonoscopy or flexible sigmoidoscopy):  Excessive amounts of blood in the stool  Significant tenderness or worsening of abdominal pains  Swelling of the abdomen that is new, acute  Fever of 100F or higher   For urgent or emergent issues, a gastroenterologist can be reached at any hour by calling [phone].   DIET:  We do recommend a small meal at first, but then you may proceed to your regular diet.  Drink plenty of fluids but you should avoid alcoholic beverages for 24 hours.  ACTIVITY:  You should plan to take it easy for the rest of today and you should NOT DRIVE or use heavy machinery until tomorrow (because of  the sedation medicines used during the test).    FOLLOW UP: Our staff will call the number listed on your records the next business day following your procedure to check on you and address any questions or concerns that you may have regarding the information given to you following your procedure. If we do not reach you, we will leave a message.  However, if you are feeling well and you are not experiencing any problems, there is no need to return our call.  We will assume that you have returned to your regular daily activities without incident.  If any biopsies were taken you will be contacted by phone or by letter within the next 1-3 weeks.  Please call us at [phone] if you have not heard about the biopsies in 3 weeks.    SIGNATURES/CONFIDENTIALITY: You and/or your care partner have signed paperwork which will be entered into your electronic medical record.  These signatures attest to the fact that that the information above on your After Visit Summary has been reviewed and is understood.  Full responsibility of the confidentiality of this discharge information lies with you and/or your care-partner.

## 2017-08-23 NOTE — Progress Notes (Signed)
Called to room to assist during endoscopic procedure.  Patient ID and intended procedure confirmed with present staff. Received instructions for my participation in the procedure from the performing physician.  

## 2017-08-23 NOTE — Progress Notes (Signed)
Report given to PACU, vss 

## 2017-08-23 NOTE — Progress Notes (Signed)
Pt. Reports no change in his medical or surgical history since his pre-visit 08/09/2017.

## 2017-08-24 ENCOUNTER — Telehealth: Payer: Self-pay

## 2017-08-24 ENCOUNTER — Telehealth: Payer: Self-pay | Admitting: *Deleted

## 2017-08-24 NOTE — Telephone Encounter (Signed)
  Follow up Call-  Call back number 08/23/2017  Post procedure Call Back phone  # [phone]  Permission to leave phone message Yes  Some recent data might be hidden     Lm that we will try back around lunch today on VM that identifies pt.

## 2017-08-24 NOTE — Telephone Encounter (Signed)
Returned a call to patient. He wanted to know how he could get a prescription for CPAP nasal mask. Patient was informed that he will need a appointment. He has not been seen by Dr Claiborne Billings in 3 years. Patient admits to not properly using or caring for his CPAP as he probably should. States that he has not gotten a new mask since he received his machine. I informed patient that Dr Claiborne Billings doesn't have any openings until September-October. I recommended to him that he could purchase his current mask out of pocket from [facility] or maybe online without a prescription. Otherwise he will need compliance appointment before his insurance company will pay for anything. Patient voiced understanding and will wait to see Dr Claiborne Billings before he purchases a mask. Due to his mask being so old and the possibility of him getting URI's, which he states he has had I told him to come by the office to see if any of my sample mask would work for him to use until he can get to his appointment. Patient  States he will come by today before 5 to possibly pick up mask.

## 2017-08-24 NOTE — Telephone Encounter (Signed)
  Follow up Call-  Call back number 08/23/2017  Post procedure Call Back phone  # [phone]  Permission to leave phone message Yes  Some recent data might be hidden     Patient questions:  Do you have a fever, pain , or abdominal swelling? No. Pain Score  0 *  Have you tolerated food without any problems? No.  Have you been able to return to your normal activities? Yes.    Do you have any questions about your discharge instructions: Diet   No. Medications  No. Follow up visit  No.  Do you have questions or concerns about your Care? No.  Actions: * If pain score is 4 or above: No action needed, pain <4.

## 2017-09-02 ENCOUNTER — Encounter: Payer: Self-pay | Admitting: Gastroenterology

## 2017-09-10 ENCOUNTER — Telehealth: Payer: Self-pay | Admitting: Gastroenterology

## 2017-09-10 NOTE — Telephone Encounter (Signed)
Reviewed the results with the patient and all questions answered.

## 2017-10-01 ENCOUNTER — Encounter (INDEPENDENT_AMBULATORY_CARE_PROVIDER_SITE_OTHER): Payer: Self-pay

## 2017-10-16 ENCOUNTER — Encounter: Payer: BC Managed Care – PPO | Admitting: Internal Medicine

## 2017-10-23 ENCOUNTER — Other Ambulatory Visit: Payer: Self-pay | Admitting: Internal Medicine

## 2017-10-25 ENCOUNTER — Encounter: Payer: Self-pay | Admitting: Internal Medicine

## 2017-10-25 ENCOUNTER — Ambulatory Visit (INDEPENDENT_AMBULATORY_CARE_PROVIDER_SITE_OTHER): Payer: BC Managed Care – PPO | Admitting: Internal Medicine

## 2017-10-25 VITALS — BP 119/79 | HR 77 | Temp 98.4°F | Ht 70.75 in | Wt 204.0 lb

## 2017-10-25 DIAGNOSIS — Z0000 Encounter for general adult medical examination without abnormal findings: Secondary | ICD-10-CM | POA: Diagnosis not present

## 2017-10-25 MED ORDER — LOSARTAN POTASSIUM 50 MG PO TABS: ORAL_TABLET | ORAL | 3 refills | Status: DC

## 2017-10-25 MED ORDER — VALACYCLOVIR HCL 1 G PO TABS: ORAL_TABLET | ORAL | 7 refills | Status: DC

## 2017-10-25 NOTE — Assessment & Plan Note (Signed)
-  Td 09.  Declined a booster today, will get next week.; PNA (23): 12/28/13  -CCS- 04/17/02;  cscope 07/2017 + polyp, 5 years  -Prostate cancer screening: DRE done today, check a PSA  -diet,exercise: Extensively discussed, calorie counting?Marland Kitchen

## 2017-10-25 NOTE — Progress Notes (Signed)
Subjective:    Patient ID: Gregory Klein, male    DOB: 05/25/1965, 52 y.o.   MRN: [id number]  DOS:  10/25/2017 Type of visit - description : CPX Interval history: No major concerns, he is a little worried about his weight.  Admits to room for improvement on his lifestyle.  Review of Systems   Other than above, a 14 point review of systems is negative     Past Medical History:  Diagnosis Date  . Achilles tendonitis, bilateral 2016   [name], DPM  . Acute myocarditis 2015   viral vs d/t strep infection, resolved w/o sequela  . Allergy   . 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 GERD (gastroesophageal reflux disease)   . Hemorrhoids    hx of  . Hepatitis B infection    hx of dx 2005 aprox  . Hyperglycemia 02/22/2015  . Hypertension   . Neck pain    local injections x 2, las summer 2011 (Dr.Vortek)  . Plantar fasciitis, bilateral 2016   [name], DPM  . Sleep apnea    on CPAP     Past Surgical History:  Procedure Laterality Date  . INGUINAL HERNIA REPAIR    . KNEE ARTHROSCOPY  6-15   Dr Mardelle Matte  . LEFT HEART CATHETERIZATION WITH CORONARY ANGIOGRAM N/A 12/29/2013   Procedure: LEFT HEART CATHETERIZATION WITH CORONARY ANGIOGRAM;  Surgeon: [name], MD;  Location: [facility] CATH LAB;  Service: Cardiovascular;  Laterality: N/A;    Social History   Socioeconomic History  . Marital status: Single    Spouse name: Not on file  . Number of children: 0  . Years of education: Not on file  . Highest education level: Not on file  Occupational History  . Occupation: Pension scheme manager: [employer]  Social Needs  . Financial resource strain: Not on file  . Food insecurity:    Worry: Not on file    Inability: Not on file  . Transportation needs:    Medical: Not on file    Non-medical: Not on file  Tobacco Use  . Smoking status: Never Smoker  .  Smokeless tobacco: Never Used  Substance and Sexual Activity  . Alcohol use: No    Alcohol/week: 0.0 standard drinks  . Drug use: No  . Sexual activity: Not on file  Lifestyle  . Physical activity:    Days per week: Not on file    Minutes per session: Not on file  . Stress: Not on file  Relationships  . Social connections:    Talks on phone: Not on file    Gets together: Not on file    Attends religious service: Not on file    Active member of club or organization: Not on file    Attends meetings of clubs or organizations: Not on file    Relationship status: Not on file  . Intimate partner violence:    Fear of current or ex partner: Not on file    Emotionally abused: Not on file    Physically abused: Not on file    Forced sexual activity: Not on file  Other Topics Concern  . Not on file  Social History Narrative    Lives by himself     Family History  Problem Relation Age of Onset  . Hypertension Mother   . Stroke Mother  M, GF  . Hypertension Father   . Coronary artery disease Maternal Grandmother   . Hypertension Maternal Grandmother   . Diabetes Paternal Grandfather   . Prostate cancer Maternal Grandfather 33  . Breast cancer Paternal Grandmother   . Pancreatic cancer Paternal Aunt   . Colon cancer Neg Hx   . Esophageal cancer Neg Hx   . Kidney disease Neg Hx   . Liver cancer Neg Hx   . Rectal cancer Neg Hx   . Stomach cancer Neg Hx     Allergies as of 10/25/2017      Reactions   Amoxicillin Rash      Medication List        Accurate as of 10/25/17 11:59 PM. Always use your most recent med list.          cetirizine 10 MG chewable tablet Commonly known as:  ZYRTEC Chew 10 mg by mouth daily.   losartan 50 MG tablet Commonly known as:  COZAAR TAKE 1 TABLET(50 MG) BY MOUTH DAILY   OVER THE COUNTER MEDICATION Place 1-2 drops into both eyes as needed (for dry eyes). "[place] for Dry Eyes"   polyethylene glycol packet Commonly known as:   MIRALAX / GLYCOLAX Take 17 g by mouth daily.   PROBIOTIC DAILY PO Take by mouth.   psyllium 58.6 % powder Commonly known as:  METAMUCIL Take 1 packet by mouth as needed (for constipation). Reported on 05/07/2015   valACYclovir 1000 MG tablet Commonly known as:  VALTREX TAKE 2 TABLETS BY MOUTH TWICE DAILY AS NEEDED FOR OUTBREAKS          Objective:   Physical Exam BP 119/79 (BP Location: Left Arm, Patient Position: Sitting, Cuff Size: Normal)   Pulse 77   Temp 98.4 F (36.9 C) (Oral)   Ht 5' 10.75" (1.797 m)   Wt 204 lb (92.5 kg)   SpO2 100%   BMI 28.65 kg/m  General: Well developed, NAD, see BMI.  Neck: No  thyromegaly  HEENT:  Normocephalic . Face symmetric, atraumatic Lungs:  CTA B Normal respiratory effort, no intercostal retractions, no accessory muscle use. Heart: RRR,  no murmur.  No pretibial edema bilaterally  Abdomen:  Not distended, soft, non-tender. No rebound or rigidity.   Rectal: External abnormalities: none. Normal sphincter tone. No rectal masses or tenderness.  Brown stools Prostate: Prostate gland: Exam somewhat limited by patient discomfort but prostate seems normal. No asymmetry or induration Skin: Exposed areas without rash. Not pale. Not jaundice Neurologic:  alert & oriented X3.  Speech normal, gait appropriate for age and unassisted Strength symmetric and appropriate for age.  Psych: Cognition and judgment appear intact.  Cooperative with normal attention span and concentration.  Behavior appropriate. No anxious or depressed appearing.     Assessment & Plan:    Assessment  Prediabetes A1C 5.9 (12-2014) HTN OSA on CPAP H/o cold sores , on valtrex  H/o Acute myocarditis --- 2015 , had a cath, problem related to strep, no sequela H/o Suspected cirrhosis, disproved by a negative biopsy 01/2014 H/o hep B infex 2015 H/o Neck pain, s/p 2 injections 2011 (Dr. Adline Peals), occ numbness R thumb, no neck pain as of 05-2015 Urolithiasis ER  04-30-25  Plan: Prediabetes: We will check a A1c HTN: Seems controlled, refill losartan, check amb BPs OSA: We will see Dr. Claiborne Billings in October but in the meantime might need a prescription for CPAP supplies, I agreed to sign them if needed. Cold sores: Refill Valtrex  RTC 1 year

## 2017-10-25 NOTE — Patient Instructions (Signed)
   GO TO THE FRONT DESK Schedule fasting labs to be done next week Schedule your next appointment for a   physical exam in 1 year   Check the  blood pressure every month Be sure your blood pressure is between 110/65 and  135/85. If it is consistently higher or lower, let me know

## 2017-10-28 NOTE — Assessment & Plan Note (Signed)
Prediabetes: We will check a A1c HTN: Seems controlled, refill losartan, check amb BPs OSA: We will see Dr. Claiborne Billings in October but in the meantime might need a prescription for CPAP supplies, I agreed to sign them if needed. Cold sores: Refill Valtrex RTC 1 year

## 2017-10-31 ENCOUNTER — Other Ambulatory Visit: Payer: BC Managed Care – PPO

## 2017-10-31 ENCOUNTER — Ambulatory Visit: Payer: BC Managed Care – PPO

## 2017-11-01 ENCOUNTER — Other Ambulatory Visit (INDEPENDENT_AMBULATORY_CARE_PROVIDER_SITE_OTHER): Payer: BC Managed Care – PPO

## 2017-11-01 ENCOUNTER — Ambulatory Visit (INDEPENDENT_AMBULATORY_CARE_PROVIDER_SITE_OTHER): Payer: BC Managed Care – PPO

## 2017-11-01 DIAGNOSIS — Z23 Encounter for immunization: Secondary | ICD-10-CM | POA: Diagnosis not present

## 2017-11-01 DIAGNOSIS — Z0000 Encounter for general adult medical examination without abnormal findings: Secondary | ICD-10-CM

## 2017-11-01 LAB — CBC WITH DIFFERENTIAL/PLATELET
BASOS ABS: 0 10*3/uL (ref 0.0–0.1)
BASOS PCT: 0.3 % (ref 0.0–3.0)
EOS ABS: 0 10*3/uL (ref 0.0–0.7)
Eosinophils Relative: 0.3 % (ref 0.0–5.0)
HEMATOCRIT: 41.3 % (ref 39.0–52.0)
Hemoglobin: 13.6 g/dL (ref 13.0–17.0)
LYMPHS PCT: 27.6 % (ref 12.0–46.0)
Lymphs Abs: 1.4 10*3/uL (ref 0.7–4.0)
MCHC: 33 g/dL (ref 30.0–36.0)
MCV: 82.4 fl (ref 78.0–100.0)
MONO ABS: 0.5 10*3/uL (ref 0.1–1.0)
Monocytes Relative: 9.3 % (ref 3.0–12.0)
NEUTROS ABS: 3.1 10*3/uL (ref 1.4–7.7)
NEUTROS PCT: 62.5 % (ref 43.0–77.0)
PLATELETS: 234 10*3/uL (ref 150.0–400.0)
RBC: 5.02 Mil/uL (ref 4.22–5.81)
RDW: 15.1 % (ref 11.5–15.5)
WBC: 4.9 10*3/uL (ref 4.0–10.5)

## 2017-11-01 LAB — LIPID PANEL
CHOL/HDL RATIO: 3
Cholesterol: 180 mg/dL (ref 0–200)
HDL: 52.2 mg/dL (ref >39.00)
LDL Cholesterol: 120 mg/dL — ABNORMAL HIGH (ref 0–99)
NONHDL: 127.45
Triglycerides: 39 mg/dL (ref 0.0–149.0)
VLDL: 7.8 mg/dL (ref 0.0–40.0)

## 2017-11-01 LAB — HEMOGLOBIN A1C: Hgb A1c MFr Bld: 6 % (ref 4.6–6.5)

## 2017-11-01 LAB — PSA: PSA: 0.83 ng/mL (ref 0.10–4.00)

## 2017-12-03 ENCOUNTER — Ambulatory Visit (INDEPENDENT_AMBULATORY_CARE_PROVIDER_SITE_OTHER): Payer: BC Managed Care – PPO | Admitting: Cardiovascular Disease

## 2017-12-03 ENCOUNTER — Encounter: Payer: Self-pay | Admitting: Cardiovascular Disease

## 2017-12-03 VITALS — BP 132/81 | HR 64 | Ht 70.0 in | Wt 203.4 lb

## 2017-12-03 DIAGNOSIS — I10 Essential (primary) hypertension: Secondary | ICD-10-CM | POA: Diagnosis not present

## 2017-12-03 DIAGNOSIS — G4733 Obstructive sleep apnea (adult) (pediatric): Secondary | ICD-10-CM

## 2017-12-03 DIAGNOSIS — Z8679 Personal history of other diseases of the circulatory system: Secondary | ICD-10-CM

## 2017-12-03 NOTE — Progress Notes (Signed)
Patient ID: Gregory Klein, male   DOB: Nov 19, 1965, 52 y.o.   MRN: [id number]     HPI: Gregory Klein, is a 52 y.o. male who presents for a follow-up sleep evaluation and in need of new CPAP supplies.  I have not seen him since May 2016.    Gregory Klein is a patient of Dr. Orene Desanctis who has a history of myocarditis which developed last year in October 2016.  This has resolved.  He has a history of hypertension and has been taking losartan.  Due to concerns for sleep apnea.  He was referred for a split-night protocol sleep study which was done on 04/02/2014.  This was positive for obstructive sleep apnea with an AHI overall of 21.5, placing him in the moderate sleep apnea category.  However, sleep apnea was severe with REM sleep at 63.8/hr. He had moderate snoring.  His baseline oxygen saturation was 93% and on the diagnostic portion of the study.  The oxygen need tear was 79% during REM sleep.  He had an occasional PAC but was in sinus rhythm.  He was titrated up to a 12 cm water pressure.  He was started on CPAP on 05/18/2014 and has an AirSense 10AutoSet unit which had been set at 12 cm.  He has a Education officer, community nasal mask.  A 30 day compliance download was obtained from 06/16/2014 through 07/15/2014.  On his present download he was  not meeting compliance standards with only 67% of days of usage an average usage at 4 hours and 8 minutes but only 30% of the days greater than 4 hours.  AHI, however, is excellent at 1.2 with treatment.  I initially saw him he was often waking up after falling asleep on the couch watching television.  This resulted in his reduced CPAP usage visual daytime sleepiness.  At that time, his Epworth Sleepiness Scale score was increased as shown below.  Epworth Sleepiness Scale: Situation   Chance of Dozing/Sleeping (0 = never , 1 = slight chance , 2 = moderate chance , 3 = high chance )   sitting and reading 2   watching TV 3   sitting inactive in a public place 3   being  a passenger in a motor vehicle for an hour or more 3   lying down in the afternoon 3   sitting and talking to someone 1   sitting quietly after lunch (no alcohol) 1   while stopped for a few minutes in traffic as the driver 1   Total Score  17   Gregory Klein admits to continued use of his CPAP therapy over the last 3+ years.  However, he continues to watch television late at night and falls asleep on the couch.  He may wake up 2 hours later then goes to bed puts his CPAP on.  He typically wakes up between 5 and 5:30 in the morning.  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  He admits to recent increased weight.  He does not routinely exercise.  He has been using the same equipment for the last 3 years.  He has been very satisfied with the Respironics DreamWear nasal mask which is worn out presently.  He also has not had new tubing or filter.  Epworth scale was recalculated in the office today and this was elevated and endorsed at 13.  He presents for evaluation.  Past Medical History:  Diagnosis Date  . Achilles tendonitis, bilateral 2016   Jana Half  Ailouny, DPM  . Acute myocarditis 2015   viral vs d/t strep infection, resolved w/o sequela  . Allergy   . 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 GERD (gastroesophageal reflux disease)   . Hemorrhoids    hx of  . Hepatitis B infection    hx of dx 2005 aprox  . Hyperglycemia 02/22/2015  . Hypertension   . Neck pain    local injections x 2, las summer 2011 (Dr.Vortek)  . Plantar fasciitis, bilateral 2016   [name], DPM  . Sleep apnea    on CPAP     Past Surgical History:  Procedure Laterality Date  . INGUINAL HERNIA REPAIR    . KNEE ARTHROSCOPY  6-15   Dr Mardelle Matte  . LEFT HEART CATHETERIZATION WITH CORONARY ANGIOGRAM N/A 12/29/2013   Procedure: LEFT HEART CATHETERIZATION WITH CORONARY ANGIOGRAM;  Surgeon: [name], MD;  Location: [facility]  CATH LAB;  Service: Cardiovascular;  Laterality: N/A;    Allergies  Allergen Reactions  . Amoxicillin Rash    Current Outpatient Medications  Medication Sig Dispense Refill  . cetirizine (ZYRTEC) 10 MG chewable tablet Chew 10 mg by mouth daily.    Marland Kitchen losartan (COZAAR) 50 MG tablet TAKE 1 TABLET(50 MG) BY MOUTH DAILY 90 tablet 3  . OVER THE COUNTER MEDICATION Place 1-2 drops into both eyes as needed (for dry eyes). "[facility] for Dry Eyes"    . polyethylene glycol (MIRALAX / GLYCOLAX) packet Take 17 g by mouth daily.    . Probiotic Product (PROBIOTIC DAILY PO) Take by mouth.    . psyllium (METAMUCIL) 58.6 % powder Take 1 packet by mouth as needed (for constipation). Reported on 05/07/2015    . valACYclovir (VALTREX) 1000 MG tablet TAKE 2 TABLETS BY MOUTH TWICE DAILY AS NEEDED FOR OUTBREAKS 30 tablet 7   No current facility-administered medications for this visit.     Social History   Socioeconomic History  . Marital status: Single    Spouse name: Not on file  . Number of children: 0  . Years of education: Not on file  . Highest education level: Not on file  Occupational History  . Occupation: Pension scheme manager: [employer]  Social Needs  . Financial resource strain: Not on file  . Food insecurity:    Worry: Not on file    Inability: Not on file  . Transportation needs:    Medical: Not on file    Non-medical: Not on file  Tobacco Use  . Smoking status: Never Smoker  . Smokeless tobacco: Never Used  Substance and Sexual Activity  . Alcohol use: No    Alcohol/week: 0.0 standard drinks  . Drug use: No  . Sexual activity: Not on file  Lifestyle  . Physical activity:    Days per week: Not on file    Minutes per session: Not on file  . Stress: Not on file  Relationships  . Social connections:    Talks on phone: Not on file    Gets together: Not on file    Attends religious service: Not on file    Active member of club or organization: Not on  file    Attends meetings of clubs or organizations: Not on file    Relationship status: Not on file  . Intimate partner violence:    Fear of current or ex partner:  Not on file    Emotionally abused: Not on file    Physically abused: Not on file    Forced sexual activity: Not on file  Other Topics Concern  . Not on file  Social History Narrative    Lives by himself   Additional social history is notable that he graduated from [employer]  He receives in his advanced degree from [employer].  He currently is the Environmental consultant principal at [employer].  Family History  Problem Relation Age of Onset  . Hypertension Mother   . Stroke Mother        M, Lillia Corporal  . Hypertension Father   . Coronary artery disease Maternal Grandmother   . Hypertension Maternal Grandmother   . Diabetes Paternal Grandfather   . Prostate cancer Maternal Grandfather 46  . Breast cancer Paternal Grandmother   . Pancreatic cancer Paternal Aunt   . Colon cancer Neg Hx   . Esophageal cancer Neg Hx   . Kidney disease Neg Hx   . Liver cancer Neg Hx   . Rectal cancer Neg Hx   . Stomach cancer Neg Hx      ROS General: Negative; No fevers, chills, or night sweats HEENT: Negative; No changes in vision or hearing, sinus congestion, difficulty swallowing Pulmonary: Negative; No cough, wheezing, shortness of breath, hemoptysis Cardiovascular: Positive for hypertension on losartan GI: Negative; No nausea, vomiting, diarrhea, or abdominal pain GU: Negative; No dysuria, hematuria, or difficulty voiding Musculoskeletal: Negative; no myalgias, joint pain, or weakness Hematologic: Negative; no easy bruising, bleeding Endocrine: Negative; no heat/cold intolerance Neuro: Negative; no changes in balance, headaches Skin: Negative; No rashes or skin lesions Psychiatric: Negative; No behavioral problems, depression Sleep: See history of present illness: No bruxism, restless legs, hypnogognic hallucinations, no  cataplexy   Physical Exam BP 132/81   Pulse 64   Ht _0  (1.778 m)   Wt 203 lb 6.4 oz (92.3 kg)   BMI 29.18 kg/m    Repeat blood pressure by me was 126/78  Wt Readings from Last 3 Encounters:  12/03/17 203 lb 6.4 oz (92.3 kg)  10/25/17 204 lb (92.5 kg)  08/23/17 204 lb (92.5 kg)   General: Alert, oriented, no distress.  Skin: normal turgor, no rashes, warm and dry HEENT: Normocephalic, atraumatic. Pupils equal round and reactive to light; sclera anicteric; extraocular muscles intact; Nose without nasal septal hypertrophy Mouth/Parynx benign; Mallinpatti scale 3 Neck: No JVD, no carotid bruits; normal carotid upstroke Lungs: clear to ausculatation and percussion; no wheezing or rales Chest wall: without tenderness to palpitation Heart: PMI not displaced, RRR, s1 s2 normal, 1/6 systolic murmur, no diastolic murmur, no rubs, gallops, thrills, or heaves Abdomen: soft, nontender; no hepatosplenomehaly, BS+; abdominal aorta nontender and not dilated by palpation. Back: no CVA tenderness Pulses 2+ Musculoskeletal: full range of motion, normal strength, no joint deformities Extremities: no clubbing cyanosis or edema, Homan's sign negative  Neurologic: grossly nonfocal; Cranial nerves grossly wnl Psychologic: Normal mood and affect  LABS:  BMP Latest Ref Rng & Units 07/12/2016 06/01/2015 05/01/2015  Glucose 70 - 99 mg/dL 90 87 110(H)  BUN 6 - 23 mg/dL _1 Creatinine 0.40 - 1.50 mg/dL 1.19 1.11 1.42(H)  Sodium 135 - 145 mEq/L 138 139 140  Potassium 3.5 - 5.1 mEq/L 4.3 3.9 4.3  Chloride 96 - 112 mEq/L 103 102 101  CO2 19 - 32 mEq/L 31 33(H) 27  Calcium 8.4 - 10.5 mg/dL 9.2 9.1 9.2  Hepatic Function Latest Ref Rng & Units 07/12/2016 06/01/2015 05/15/2014  Total Protein 6.0 - 8.3 g/dL 7.3 - -  Albumin 3.5 - 5.2 g/dL 4.2 - -  AST 0 - 37 U/L 33 14 18  ALT 0 - 53 U/L 44 14 21  Alk Phosphatase 39 - 117 U/L 56 - -  Total Bilirubin 0.2 - 1.2 mg/dL 1.1 - -  Bilirubin, Direct 0.0  - 0.3 mg/dL - - -     CBC Latest Ref Rng & Units 11/01/2017 05/16/2017 10/12/2016  WBC 4.0 - 10.5 K/uL 4.9 7.9 4.7  Hemoglobin 13.0 - 17.0 g/dL 13.6 13.8 14.0  Hematocrit 39.0 - 52.0 % 41.3 42.3 42.8  Platelets 150.0 - 400.0 K/uL 234.0 272.0 237.0     Lipid Panel     Component Value Date/Time   CHOL 180 11/01/2017 1052   TRIG 39.0 11/01/2017 1052   HDL 52.20 11/01/2017 1052   CHOLHDL 3 11/01/2017 1052   VLDL 7.8 11/01/2017 1052   LDLCALC 120 (H) 11/01/2017 1052   LDLDIRECT 135.8 03/29/2011 1043     RADIOLOGY: No results found.  IMPRESSION: 1. OSA (obstructive sleep apnea)   2. Essential hypertension   3. History of myocarditis      ASSESSMENT AND PLAN: Gregory Klein is a 52 year-old African-American male who was diagnosed with recent moderate obstructive sleep apnea overall with an AHI of 21.5/hr. During REM sleep, sleep apnea was very severe at 63.8 per hour and he has documented oxygen desaturation to a nadir of 79%.  He has been on CPAP therapy since March 2016.  I obtained a download in the office today.  He is using CPAP 83% of the night.  He tells me he often falls asleep on the couch watching TV or sports.  He then wakes up several hours later goes to bed and puts his CPAP on which undoubtedly is accounting for his average usage of only 4 hours and 19 minutes.  This past weekend he traveled to [place] and did use CPAP all night and did note improved energy.  On his current download AHI is excellent at 0.8.  He is in need for new supplies.  He currently uses a Respironics DreamWear nasal mask which she felt has been excellent.  He is in need for new tubing as well as filter.  I will renew his supplies and have also given an option to try the [place] to compare with the Respironics DreamWear.  These are both excellent and hopefully he can decide which he prefers.  We again talked about the importance of obtaining 7 hours of sleep per night.  Is now on a CPAP  auto mode with his 95th percentile pressure at 10.6 with a maximum average pressure at 11.3.  ECG today remains stable with normal sinus rhythm at 64.  His blood pressure on losartan is well controlled and he is tolerating this well.  I reviewed recent laboratory done by his primary physician.  I will be available on an as-needed basis from a sleep perspective.  Time spent: 25 minutes  Troy Sine, MD, [facility]  12/03/2017 9:37 AM

## 2017-12-03 NOTE — Patient Instructions (Addendum)
Medication Instructions:  Your physician recommends that you continue on your current medications as directed. Please refer to the Current Medication list given to you today.  Follow-Up: As needed with Dr. Claiborne Billings  Any Other Special Instructions Will Be Listed Below (If Applicable).  We will send orders to [facility] for new supplies   If you need a refill on your cardiac medications before your next appointment, please call your pharmacy.

## 2018-03-15 ENCOUNTER — Telehealth: Payer: Self-pay | Admitting: Medical

## 2018-03-15 ENCOUNTER — Ambulatory Visit: Payer: BC Managed Care – PPO | Admitting: Medical

## 2018-03-15 ENCOUNTER — Other Ambulatory Visit (INDEPENDENT_AMBULATORY_CARE_PROVIDER_SITE_OTHER): Payer: BC Managed Care – PPO

## 2018-03-15 DIAGNOSIS — I10 Essential (primary) hypertension: Secondary | ICD-10-CM | POA: Diagnosis not present

## 2018-03-15 DIAGNOSIS — R7303 Prediabetes: Secondary | ICD-10-CM | POA: Diagnosis not present

## 2018-03-15 DIAGNOSIS — Z0289 Encounter for other administrative examinations: Secondary | ICD-10-CM

## 2018-03-15 LAB — COMPREHENSIVE METABOLIC PANEL
ALT: 27 U/L (ref 0–53)
AST: 21 U/L (ref 0–37)
Albumin: 4 g/dL (ref 3.5–5.2)
Alkaline Phosphatase: 59 U/L (ref 39–117)
BUN: 13 mg/dL (ref 6–23)
CO2: 32 meq/L (ref 19–32)
Calcium: 9.4 mg/dL (ref 8.4–10.5)
Chloride: 103 mEq/L (ref 96–112)
Creatinine, Ser: 1.16 mg/dL (ref 0.40–1.50)
GFR: 80.32 mL/min (ref >60.00)
GLUCOSE: 103 mg/dL — AB (ref 70–99)
Potassium: 5.2 mEq/L — ABNORMAL HIGH (ref 3.5–5.1)
Sodium: 140 mEq/L (ref 135–145)
Total Bilirubin: 0.9 mg/dL (ref 0.2–1.2)
Total Protein: 6.8 g/dL (ref 6.0–8.3)

## 2018-03-15 LAB — HEMOGLOBIN A1C: Hgb A1c MFr Bld: 6.1 % (ref 4.6–6.5)

## 2018-03-15 NOTE — Addendum Note (Signed)
Addended by: Kelle Darting A on: 03/15/2018 08:45 AM   Modules accepted: Orders

## 2018-03-15 NOTE — Telephone Encounter (Signed)
cmp and a1c order placed. Pt was late today for office. Did not see him but put in labs he requests/needs by chart review.

## 2018-03-21 ENCOUNTER — Ambulatory Visit: Payer: BC Managed Care – PPO | Admitting: Internal Medicine

## 2018-03-21 ENCOUNTER — Encounter: Payer: Self-pay | Admitting: Internal Medicine

## 2018-03-21 VITALS — BP 120/78 | HR 79 | Temp 98.3°F | Resp 16 | Ht 70.0 in | Wt 211.0 lb

## 2018-03-21 DIAGNOSIS — I10 Essential (primary) hypertension: Secondary | ICD-10-CM | POA: Diagnosis not present

## 2018-03-21 DIAGNOSIS — M898X8 Other specified disorders of bone, other site: Secondary | ICD-10-CM

## 2018-03-21 LAB — BASIC METABOLIC PANEL
BUN: 12 mg/dL (ref 6–23)
CO2: 34 mEq/L — ABNORMAL HIGH (ref 19–32)
Calcium: 8.9 mg/dL (ref 8.4–10.5)
Chloride: 103 mEq/L (ref 96–112)
Creatinine, Ser: 1.17 mg/dL (ref 0.40–1.50)
GFR: 79.52 mL/min (ref >60.00)
GLUCOSE: 107 mg/dL — AB (ref 70–99)
Potassium: 4.7 mEq/L (ref 3.5–5.1)
Sodium: 140 mEq/L (ref 135–145)

## 2018-03-21 NOTE — Assessment & Plan Note (Signed)
Iliac crest pain, likely tendinitis.  Pain is mild, recommend ice nightly, judicious use of ibuprofen, call if not better HTN: Controlled, last BMP show mild elevated potassium, on losartan.  Recheck a BMP. Prediabetes: Last A1c 6.1, still very good but the trend is going up, we talk about diet and exercise and he is determined to do better. RTC as a schedule 09/2018

## 2018-03-21 NOTE — Progress Notes (Signed)
Pre visit review using our clinic review tool, if applicable. No additional management support is needed unless otherwise documented below in the visit note. 

## 2018-03-21 NOTE — Progress Notes (Signed)
Subjective:    Patient ID: Gregory Klein, male    DOB: Oct 30, 1965, 53 y.o.   MRN: [id number]  DOS:  03/21/2018 Type of visit - description: Acute 2 to 3 weeks history of pain at the left iliac crest, the pain is steady, nagging, sometimes worse when he walks upstairs, decreased with Aleve.  The pain is there also sometimes at rest.  Discussed recent blood work.  Review of Systems  Last week, after he ate late at a fast food restaurant developed nausea and vomited once.  No hematemesis, GI symptoms resolved. Currently with no abdominal pain. No fever chills No nausea or vomiting No dysuria or gross hematuria  Past Medical History:  Diagnosis Date  . Achilles tendonitis, bilateral 2016   [name], DPM  . Acute myocarditis 2015   viral vs d/t strep infection, resolved w/o sequela  . Allergy   . 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 GERD (gastroesophageal reflux disease)   . Hemorrhoids    hx of  . Hepatitis B infection    hx of dx 2005 aprox  . Hyperglycemia 02/22/2015  . Hypertension   . Neck pain    local injections x 2, las summer 2011 (Dr.Vortek)  . Plantar fasciitis, bilateral 2016   [name], DPM  . Sleep apnea    on CPAP     Past Surgical History:  Procedure Laterality Date  . INGUINAL HERNIA REPAIR    . KNEE ARTHROSCOPY  6-15   Dr Mardelle Matte  . LEFT HEART CATHETERIZATION WITH CORONARY ANGIOGRAM N/A 12/29/2013   Procedure: LEFT HEART CATHETERIZATION WITH CORONARY ANGIOGRAM;  Surgeon: [name], MD;  Location: [facility] CATH LAB;  Service: Cardiovascular;  Laterality: N/A;    Social History   Socioeconomic History  . Marital status: Single    Spouse name: Not on file  . Number of children: 0  . Years of education: Not on file  . Highest education level: Not on file  Occupational History  . Occupation: Pension scheme manager: [employer]    Social Needs  . Financial resource strain: Not on file  . Food insecurity:    Worry: Not on file    Inability: Not on file  . Transportation needs:    Medical: Not on file    Non-medical: Not on file  Tobacco Use  . Smoking status: Never Smoker  . Smokeless tobacco: Never Used  Substance and Sexual Activity  . Alcohol use: No    Alcohol/week: 0.0 standard drinks  . Drug use: No  . Sexual activity: Not on file  Lifestyle  . Physical activity:    Days per week: Not on file    Minutes per session: Not on file  . Stress: Not on file  Relationships  . Social connections:    Talks on phone: Not on file    Gets together: Not on file    Attends religious service: Not on file    Active member of club or organization: Not on file    Attends meetings of clubs or organizations: Not on file    Relationship status: Not on file  . Intimate partner violence:    Fear of current or ex partner: Not on file    Emotionally abused: Not on file    Physically abused: Not on file    Forced sexual activity: Not on file  Other Topics Concern  . Not on file  Social History Narrative    Lives by himself      Allergies as of 03/21/2018      Reactions   Amoxicillin Rash      Medication List       Accurate as of March 21, 2018  3:42 PM. Always use your most recent med list.        cetirizine 10 MG chewable tablet Commonly known as:  ZYRTEC Chew 10 mg by mouth daily.   losartan 50 MG tablet Commonly known as:  COZAAR TAKE 1 TABLET(50 MG) BY MOUTH DAILY   OVER THE COUNTER MEDICATION Place 1-2 drops into both eyes as needed (for dry eyes). "[place] for Dry Eyes"   polyethylene glycol packet Commonly known as:  MIRALAX / GLYCOLAX Take 17 g by mouth daily.   PROBIOTIC DAILY PO Take by mouth.   psyllium 58.6 % powder Commonly known as:  METAMUCIL Take 1 packet by mouth as needed (for constipation). Reported on 05/07/2015   valACYclovir 1000 MG tablet Commonly known as:   VALTREX TAKE 2 TABLETS BY MOUTH TWICE DAILY AS NEEDED FOR OUTBREAKS           Objective:   Physical Exam BP 120/78 (BP Location: Left Arm, Patient Position: Sitting, Cuff Size: Small)   Pulse 79   Temp 98.3 F (36.8 C) (Oral)   Resp 16   Ht 5\' 10"  (1.778 m)   Wt 211 lb (95.7 kg)   SpO2 93%   BMI 30.28 kg/m  General:   Well developed, NAD, BMI noted. HEENT:  Normocephalic . Face symmetric, atraumatic Lungs:  CTA B Normal respiratory effort, no intercostal retractions, no accessory muscle use. Heart: RRR,  no murmur.  No pretibial edema bilaterally Abdomen: Soft, nontender. No inguinal hernias MSK: Not really tender at the iliac crest on either side.  With hip rotation on the left I was able to trigger the iliac crest  pain. Skin: Not pale. Not jaundice Neurologic:  alert & oriented X3.  Speech normal, gait appropriate for age and unassisted Psych--  Cognition and judgment appear intact.  Cooperative with normal attention span and concentration.  Behavior appropriate. No anxious or depressed appearing.      Assessment    Assessment  Prediabetes A1C 5.9 (12-2014) HTN OSA on CPAP H/o cold sores , on valtrex  H/o Acute myocarditis --- 2015 , had a cath, problem related to strep, no sequela H/o Suspected cirrhosis, disproved by a negative biopsy 01/2014 H/o hep B infex 2015 H/o Neck pain, s/p 2 injections 2011 (Dr. Adline Peals), occ numbness R thumb, no neck pain as of 05-2015 Urolithiasis ER 04-30-25  Plan: Iliac crest pain, likely tendinitis.  Pain is mild, recommend ice nightly, judicious use of ibuprofen, call if not better HTN: Controlled, last BMP show mild elevated potassium, on losartan.  Recheck a BMP. Prediabetes: Last A1c 6.1, still very good but the trend is going up, we talk about diet and exercise and he is determined to do better. RTC as a schedule 09/2018

## 2018-03-21 NOTE — Patient Instructions (Signed)
  GO TO THE LAB : Get the blood work     

## 2018-04-05 ENCOUNTER — Ambulatory Visit: Payer: Self-pay | Admitting: *Deleted

## 2018-04-05 ENCOUNTER — Telehealth: Payer: Self-pay | Admitting: Medical

## 2018-04-05 ENCOUNTER — Encounter: Payer: Self-pay | Admitting: Medical

## 2018-04-05 ENCOUNTER — Ambulatory Visit (HOSPITAL_BASED_OUTPATIENT_CLINIC_OR_DEPARTMENT_OTHER)
Admission: RE | Admit: 2018-04-05 | Discharge: 2018-04-05 | Disposition: A | Discharge status: Home / Self Care | Payer: BC Managed Care – PPO | Source: Ambulatory Visit | Attending: Medical | Admitting: Medical

## 2018-04-05 ENCOUNTER — Ambulatory Visit: Payer: BC Managed Care – PPO | Admitting: Medical

## 2018-04-05 VITALS — BP 123/81 | HR 80 | Temp 98.0°F | Resp 16 | Ht 70.0 in | Wt 210.2 lb

## 2018-04-05 DIAGNOSIS — M545 Low back pain, unspecified: Secondary | ICD-10-CM

## 2018-04-05 MED ORDER — KETOROLAC TROMETHAMINE 60 MG/2ML IM SOLN
60.0000 mg | Freq: Once | INTRAMUSCULAR | Status: AC
  Administered 2018-04-05: 60 mg via INTRAMUSCULAR

## 2018-04-05 MED ORDER — CYCLOBENZAPRINE HCL 5 MG PO TABS: 5.0000 mg | ORAL_TABLET | Freq: Every day | ORAL | 0 refills | Status: DC

## 2018-04-05 MED ORDER — DICLOFENAC SODIUM 75 MG PO TBEC: 75.0000 mg | DELAYED_RELEASE_TABLET | Freq: Two times a day (BID) | ORAL | 0 refills | Status: DC

## 2018-04-05 NOTE — Telephone Encounter (Signed)
Please result pt urine. Was clear except protein present. I believe 1+.

## 2018-04-05 NOTE — Telephone Encounter (Signed)
Pt called with complaints of throbbing back pain after sleeping in an uncomfortable position; he states that he had a kidney stone in the past; he would like to make an appointment to see if this is happening again; the pt rated his initial pain as 8 out of 10 tylenol at 0300; he now rates his pain at 4 out of 10; recommendations made per nurse triage protocol; pt normally sees Dr [name] but he has no availability; pt offered and accepted appointment with Mackie Pai, [place], 04/05/2018 at 62; he verbalized understanding; will route to office for notification.  Reason for Disposition . [1] Age > 3 AND [2] no history of prior similar back pain  Answer Assessment - Initial Assessment Questions 1. ONSET: "When did the pain begin?"      04/05/2018 at 0300 2. LOCATION: "Where does it hurt?" (upper, mid or lower back)     Left lower back 3. SEVERITY: "How bad is the pain?"  (e.g., Scale 1-10; mild, moderate, or severe)   - MILD (1-3): doesn't interfere with normal activities    - MODERATE (4-7): interferes with normal activities or awakens from sleep    - SEVERE (8-10): excruciating pain, unable to do any normal activities      Moderate to severe 4. PATTERN: "Is the pain constant?" (e.g., yes, no; constant, intermittent)      constant 5. RADIATION: "Does the pain shoot into your legs or elsewhere?"     no 6. CAUSE:  "What do you think is causing the back pain?"      Kidney stone, the way he slept, muscle spasms 7. BACK OVERUSE:  "Any recent lifting of heavy objects, strenuous work or exercise?"     no 8. MEDICATIONS: "What have you taken so far for the pain?" (e.g., nothing, acetaminophen, NSAIDS)     Tylenol helpteds 9. NEUROLOGIC SYMPTOMS: "Do you have any weakness, numbness, or problems with bowel/bladder control?"     no 10. OTHER SYMPTOMS: "Do you have any other symptoms?" (e.g., fever, abdominal pain, burning with urination, blood in urine)       no 11. PREGNANCY: "Is there any  chance you are pregnant?" (e.g., yes, no; LMP)       n/a  Protocols used: BACK PAIN-A-AH

## 2018-04-05 NOTE — Patient Instructions (Signed)
You do have some lumbar spine region pain present since yesterday and by exam appears to be in the mid to upper lumbar region.  By history pain appears to be musculoskeletal type pain.  Some history of kidney stones but features do not appear kidney stone like presently.  We did get UA today and no blood present.  This decreases chance of pain being from kidney stone as well.  Will do lumbar spine x-ray today to assess features of the spine.  We gave you Toradol 60 mg IM injection today.  You can start on diclofenac NSAID tomorrow afternoon.  Making Flexeril muscle relaxant to use at night if needed.  Back stretching exercises given.  Can start those as tolerated when pain decreases.  If pain lingers despite above measures might consider physical therapy or sports medicine.  Follow-up in 7 to 10 days or as needed.   Back Exercises If you have pain in your back, do these exercises 2-3 times each day or as told by your doctor. When the pain goes away, do the exercises once each day, but repeat the steps more times for each exercise (do more repetitions). If you do not have pain in your back, do these exercises once each day or as told by your doctor. Exercises Single Knee to Chest Do these steps 3-5 times in a row for each leg: 1. Lie on your back on a firm bed or the floor with your legs stretched out. 2. Bring one knee to your chest. 3. Hold your knee to your chest by grabbing your knee or thigh. 4. Pull on your knee until you feel a gentle stretch in your lower back. 5. Keep doing the stretch for 10-30 seconds. 6. Slowly let go of your leg and straighten it. Pelvic Tilt Do these steps 5-10 times in a row: 1. Lie on your back on a firm bed or the floor with your legs stretched out. 2. Bend your knees so they point up to the ceiling. Your feet should be flat on the floor. 3. Tighten your lower belly (abdomen) muscles to press your lower back against the floor. This will make your  tailbone point up to the ceiling instead of pointing down to your feet or the floor. 4. Stay in this position for 5-10 seconds while you gently tighten your muscles and breathe evenly. Cat-Cow Do these steps until your lower back bends more easily: 1. Get on your hands and knees on a firm surface. Keep your hands under your shoulders, and keep your knees under your hips. You may put padding under your knees. 2. Let your head hang down, and make your tailbone point down to the floor so your lower back is round like the back of a cat. 3. Stay in this position for 5 seconds. 4. Slowly lift your head and make your tailbone point up to the ceiling so your back hangs low (sags) like the back of a cow. 5. Stay in this position for 5 seconds.  Press-Ups Do these steps 5-10 times in a row: 1. Lie on your belly (face-down) on the floor. 2. Place your hands near your head, about shoulder-width apart. 3. While you keep your back relaxed and keep your hips on the floor, slowly straighten your arms to raise the top half of your body and lift your shoulders. Do not use your back muscles. To make yourself more comfortable, you may change where you place your hands. 4. Stay in this position for  5 seconds. 5. Slowly return to lying flat on the floor.  Bridges Do these steps 10 times in a row: 1. Lie on your back on a firm surface. 2. Bend your knees so they point up to the ceiling. Your feet should be flat on the floor. 3. Tighten your butt muscles and lift your butt off of the floor until your waist is almost as high as your knees. If you do not feel the muscles working in your butt and the back of your thighs, slide your feet 1-2 inches farther away from your butt. 4. Stay in this position for 3-5 seconds. 5. Slowly lower your butt to the floor, and let your butt muscles relax. If this exercise is too easy, try doing it with your arms crossed over your chest. Belly Crunches Do these steps 5-10 times in a  row: 1. Lie on your back on a firm bed or the floor with your legs stretched out. 2. Bend your knees so they point up to the ceiling. Your feet should be flat on the floor. 3. Cross your arms over your chest. 4. Tip your chin a little bit toward your chest but do not bend your neck. 5. Tighten your belly muscles and slowly raise your chest just enough to lift your shoulder blades a tiny bit off of the floor. 6. Slowly lower your chest and your head to the floor. Back Lifts Do these steps 5-10 times in a row: 1. Lie on your belly (face-down) with your arms at your sides, and rest your forehead on the floor. 2. Tighten the muscles in your legs and your butt. 3. Slowly lift your chest off of the floor while you keep your hips on the floor. Keep the back of your head in line with the curve in your back. Look at the floor while you do this. 4. Stay in this position for 3-5 seconds. 5. Slowly lower your chest and your face to the floor. Contact a doctor if:  Your back pain gets a lot worse when you do an exercise.  Your back pain does not lessen 2 hours after you exercise. If you have any of these problems, stop doing the exercises. Do not do them again unless your doctor says it is okay. Get help right away if:  You have sudden, very bad back pain. If this happens, stop doing the exercises. Do not do them again unless your doctor says it is okay. This information is not intended to replace advice given to you by your health care provider. Make sure you discuss any questions you have with your health care provider. Document Released: 03/18/2010 Document Revised: 11/07/2017 Document Reviewed: 04/09/2014 Elsevier Interactive Patient Education  [employer].

## 2018-04-05 NOTE — Progress Notes (Signed)
Subjective:    Patient ID: Gregory Klein, male    DOB: 1965-09-13, 53 y.o.   MRN: [id number]  HPI  Pt in for some recent back pain that he noticed this morning. He fell asleep on his cough with lap top on leg. He states awkward position leaning on arm rest of couch. Pain was worse in the morning when first woke. Pt states pain 4-5/10 now but 8-9/10 when he woke. He points to mid lumbar area as midline. Pain on changing positions.  Pt took tylenol this morning.  He has  history of kidney stones 2 years ago. No flank pain presently or last night  No pain when he urinates. He thought maybe brief chill last night but none now.    Review of Systems  Constitutional: Positive for chills. Negative for fatigue and fever.       Mild chill last night. Very brief.  Respiratory: Negative for cough, choking, shortness of breath and wheezing.   Cardiovascular: Negative for chest pain and palpitations.  Genitourinary: Negative for dysuria, frequency and urgency.  Musculoskeletal: Positive for back pain. Negative for arthralgias, joint swelling, myalgias and neck stiffness.  Skin: Negative for rash.  Neurological: Negative for dizziness and headaches.  Hematological: Negative for adenopathy. Does not bruise/bleed easily.  Psychiatric/Behavioral: Negative for behavioral problems and decreased concentration.    Past Medical History:  Diagnosis Date  . Achilles tendonitis, bilateral 2016   [name], DPM  . Acute myocarditis 2015   viral vs d/t strep infection, resolved w/o sequela  . Allergy   . 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 GERD (gastroesophageal reflux disease)   . Hemorrhoids    hx of  . Hepatitis B infection    hx of dx 2005 aprox  . Hyperglycemia 02/22/2015  . Hypertension   . Neck pain    local injections x 2, las summer 2011 (Dr.Vortek)  . Plantar fasciitis, bilateral 2016   [name], DPM  . Sleep apnea    on CPAP      Social History   Socioeconomic History  . Marital status: Single    Spouse name: Not on file  . Number of children: 0  . Years of education: Not on file  . Highest education level: Not on file  Occupational History  . Occupation: Pension scheme manager: [employer]  Social Needs  . Financial resource strain: Not on file  . Food insecurity:    Worry: Not on file    Inability: Not on file  . Transportation needs:    Medical: Not on file    Non-medical: Not on file  Tobacco Use  . Smoking status: Never Smoker  . Smokeless tobacco: Never Used  Substance and Sexual Activity  . Alcohol use: No    Alcohol/week: 0.0 standard drinks  . Drug use: No  . Sexual activity: Not on file  Lifestyle  . Physical activity:    Days per week: Not on file    Minutes per session: Not on file  . Stress: Not on file  Relationships  . Social connections:    Talks on phone: Not on file    Gets together: Not on file    Attends religious service: Not on file    Active member of club or organization: Not on file    Attends meetings of clubs or organizations: Not  on file    Relationship status: Not on file  . Intimate partner violence:    Fear of current or ex partner: Not on file    Emotionally abused: Not on file    Physically abused: Not on file    Forced sexual activity: Not on file  Other Topics Concern  . Not on file  Social History Narrative    Lives by himself    Past Surgical History:  Procedure Laterality Date  . INGUINAL HERNIA REPAIR    . KNEE ARTHROSCOPY  6-15   Dr Mardelle Matte  . LEFT HEART CATHETERIZATION WITH CORONARY ANGIOGRAM N/A 12/29/2013   Procedure: LEFT HEART CATHETERIZATION WITH CORONARY ANGIOGRAM;  Surgeon: [name], MD;  Location: [facility] CATH LAB;  Service: Cardiovascular;  Laterality: N/A;    Family History  Problem Relation Age of Onset  . Hypertension Mother   . Stroke Mother        M, Lillia Corporal    . Hypertension Father   . Coronary artery disease Maternal Grandmother   . Hypertension Maternal Grandmother   . Diabetes Paternal Grandfather   . Prostate cancer Maternal Grandfather 3  . Breast cancer Paternal Grandmother   . Pancreatic cancer Paternal Aunt   . Colon cancer Neg Hx   . Esophageal cancer Neg Hx   . Kidney disease Neg Hx   . Liver cancer Neg Hx   . Rectal cancer Neg Hx   . Stomach cancer Neg Hx     Allergies  Allergen Reactions  . Amoxicillin Rash    Current Outpatient Medications on File Prior to Visit  Medication Sig Dispense Refill  . cetirizine (ZYRTEC) 10 MG chewable tablet Chew 10 mg by mouth daily.    Marland Kitchen losartan (COZAAR) 50 MG tablet TAKE 1 TABLET(50 MG) BY MOUTH DAILY 90 tablet 3  . OVER THE COUNTER MEDICATION Place 1-2 drops into both eyes as needed (for dry eyes). "[facility] for Dry Eyes"    . polyethylene glycol (MIRALAX / GLYCOLAX) packet Take 17 g by mouth daily.    . Probiotic Product (PROBIOTIC DAILY PO) Take by mouth.    . psyllium (METAMUCIL) 58.6 % powder Take 1 packet by mouth as needed (for constipation). Reported on 05/07/2015    . valACYclovir (VALTREX) 1000 MG tablet TAKE 2 TABLETS BY MOUTH TWICE DAILY AS NEEDED FOR OUTBREAKS 30 tablet 7   No current facility-administered medications on file prior to visit.     BP 123/81   Pulse 80   Temp 98 F (36.7 C) (Oral)   Resp 16   Ht 5\' 10"  (1.778 m)   Wt 210 lb 3.2 oz (95.3 kg)   SpO2 100%   BMI 30.16 kg/m       Objective:   Physical Exam  General Appearance- Not in acute distress.    Chest and Lung Exam Auscultation: Breath sounds:-Normal. Clear even and unlabored. Adventitious sounds:- No Adventitious sounds.  Cardiovascular Auscultation:Rythm - Regular, rate and rythm. Heart Sounds -Normal heart sounds.  Abdomen Inspection:-Inspection Normal.  Palpation/Perucssion: Palpation and Percussion of the abdomen reveal- Non Tender, No Rebound tenderness, No  rigidity(Guarding) and No Palpable abdominal masses.  Liver:-Normal.  Spleen:- Normal.   Back Mid lumbar spine tenderness to palpation. And paraspinal.  Pain on straight leg lift mild Pain on lateral movements and flexion/extension of the spine.  No cva tenderness  Lower ext neurologic  L5-S1 sensation intact bilaterally. Normal patellar reflexes bilaterally. No foot drop bilaterally.  Assessment & Plan:  You do have some lumbar spine region pain present since yesterday and by exam appears to be in the mid to upper lumbar region.  By history pain appears to be musculoskeletal type pain.  Some history of kidney stones but features do not appear kidney stone like presently.  We did get UA today and no blood present.  This decreases chance of pain being from kidney stone as well.  Will do lumbar spine x-ray today to assess features of the spine.  We gave you Toradol 60 mg IM injection today.  You can start on diclofenac NSAID tomorrow afternoon.  Making Flexeril muscle relaxant to use at night if needed.  Back stretching exercises given.  Can start those as tolerated when pain decreases.  If pain lingers despite above measures might consider physical therapy or sports medicine.  Follow-up in 7 to 10 days or as needed.  Mackie Pai, PA-C

## 2018-04-16 ENCOUNTER — Other Ambulatory Visit: Payer: Self-pay | Admitting: Internal Medicine

## 2018-05-21 ENCOUNTER — Telehealth: Payer: Self-pay | Admitting: Internal Medicine

## 2018-05-21 NOTE — Telephone Encounter (Signed)
Refills on losartan 50mg  sent on 04/16/2018 #90 and 3 refills.

## 2018-05-21 NOTE — Telephone Encounter (Signed)
Copied from [place] [id number]. Topic: Quick Communication - Rx Refill/Question >> May 21, 2018  2:16 PM Waylan Rocher, Lumin L wrote: Medication: losartan (COZAAR) 50 MG tablet (earlt fill request for 90 day supply)  Has the patient contacted their pharmacy? no (Agent: If no, request that the patient contact the pharmacy for the refill.) (Agent: If yes, when and what did the pharmacy advise?)  Preferred Pharmacy (with phone number or street name): [facility], [place] [address] [place] [address] Phone: [phone] Fax: [phone]  Agent: Please be advised that RX refills may take up to 3 business days. We ask that you follow-up with your pharmacy.

## 2018-09-10 ENCOUNTER — Telehealth: Payer: Self-pay

## 2018-09-10 DIAGNOSIS — Z01818 Encounter for other preprocedural examination: Secondary | ICD-10-CM

## 2018-09-10 DIAGNOSIS — Z0000 Encounter for general adult medical examination without abnormal findings: Secondary | ICD-10-CM

## 2018-09-10 DIAGNOSIS — R739 Hyperglycemia, unspecified: Secondary | ICD-10-CM

## 2018-09-10 NOTE — Telephone Encounter (Signed)
Schedule  BMP, CBC DX preop A1c: Hyperglycemia

## 2018-09-10 NOTE — Telephone Encounter (Signed)
Gregory Klein- can you contact Pt and schedule lab appt 1 week prior to cpe appt. I have ordered labs.

## 2018-09-10 NOTE — Telephone Encounter (Signed)
Copied from [place] [id number]. Topic: Appointment Scheduling - Scheduling Inquiry for Clinic >> Sep 10, 2018  9:20 AM Celene Kras A wrote: Reason for CRM: Pt is requesting to get his labs done. Please advise.

## 2018-09-10 NOTE — Telephone Encounter (Signed)
Please schedule at his convenience. Thank you.

## 2018-09-10 NOTE — Telephone Encounter (Signed)
He needs a physical August 2020.  Please be sure he has an appointment. We can do blood work 1 week before the visit. CMP, CBC, FLP, A1c (hyperglycemia), PSA

## 2018-09-10 NOTE — Telephone Encounter (Signed)
Gregory Klein is asking for labs prior to meniscus surgery he has on 09/26/2018. They are wanting to make sure he has a recent A1C included. CPE isn't until 8/31. Please advise.

## 2018-09-10 NOTE — Telephone Encounter (Signed)
Please advise- no future labs.

## 2018-09-10 NOTE — Telephone Encounter (Signed)
Please advise 

## 2018-09-11 NOTE — Telephone Encounter (Signed)
LM for pt to call back and schedule lab visit

## 2018-09-24 ENCOUNTER — Other Ambulatory Visit (INDEPENDENT_AMBULATORY_CARE_PROVIDER_SITE_OTHER): Payer: BC Managed Care – PPO

## 2018-09-24 DIAGNOSIS — R739 Hyperglycemia, unspecified: Secondary | ICD-10-CM | POA: Diagnosis not present

## 2018-09-24 DIAGNOSIS — Z01818 Encounter for other preprocedural examination: Secondary | ICD-10-CM

## 2018-09-24 LAB — CBC WITH DIFFERENTIAL/PLATELET
Basophils Absolute: 0 10*3/uL (ref 0.0–0.1)
Basophils Relative: 0.7 % (ref 0.0–3.0)
Eosinophils Absolute: 0 10*3/uL (ref 0.0–0.7)
Eosinophils Relative: 0.5 % (ref 0.0–5.0)
HCT: 42.6 % (ref 39.0–52.0)
Hemoglobin: 13.8 g/dL (ref 13.0–17.0)
Lymphocytes Relative: 24.5 % (ref 12.0–46.0)
Lymphs Abs: 1.2 10*3/uL (ref 0.7–4.0)
MCHC: 32.5 g/dL (ref 30.0–36.0)
MCV: 83.7 fl (ref 78.0–100.0)
Monocytes Absolute: 0.5 10*3/uL (ref 0.1–1.0)
Monocytes Relative: 10.2 % (ref 3.0–12.0)
Neutro Abs: 3.2 10*3/uL (ref 1.4–7.7)
Neutrophils Relative %: 64.1 % (ref 43.0–77.0)
Platelets: 242 10*3/uL (ref 150.0–400.0)
RBC: 5.09 Mil/uL (ref 4.22–5.81)
RDW: 14.3 % (ref 11.5–15.5)
WBC: 5 10*3/uL (ref 4.0–10.5)

## 2018-09-24 LAB — COMPREHENSIVE METABOLIC PANEL
ALT: 17 U/L (ref 0–53)
AST: 16 U/L (ref 0–37)
Albumin: 4.3 g/dL (ref 3.5–5.2)
Alkaline Phosphatase: 58 U/L (ref 39–117)
BUN: 16 mg/dL (ref 6–23)
CO2: 31 mEq/L (ref 19–32)
Calcium: 9.1 mg/dL (ref 8.4–10.5)
Chloride: 104 mEq/L (ref 96–112)
Creatinine, Ser: 1.17 mg/dL (ref 0.40–1.50)
GFR: 79.36 mL/min (ref >60.00)
Glucose, Bld: 103 mg/dL — ABNORMAL HIGH (ref 70–99)
Potassium: 4 mEq/L (ref 3.5–5.1)
Sodium: 139 mEq/L (ref 135–145)
Total Bilirubin: 0.8 mg/dL (ref 0.2–1.2)
Total Protein: 7.3 g/dL (ref 6.0–8.3)

## 2018-09-24 LAB — HEMOGLOBIN A1C: Hgb A1c MFr Bld: 5.9 % (ref 4.6–6.5)

## 2018-09-24 NOTE — Addendum Note (Signed)
Addended byDamita Dunnings D on: 09/24/2018 10:14 AM   Modules accepted: Orders

## 2018-09-26 HISTORY — PX: KNEE ARTHROSCOPY WITH MEDIAL MENISECTOMY: SHX5651

## 2018-09-27 ENCOUNTER — Other Ambulatory Visit (INDEPENDENT_AMBULATORY_CARE_PROVIDER_SITE_OTHER): Payer: BC Managed Care – PPO

## 2018-09-27 DIAGNOSIS — I10 Essential (primary) hypertension: Secondary | ICD-10-CM | POA: Diagnosis not present

## 2018-09-27 DIAGNOSIS — Z0000 Encounter for general adult medical examination without abnormal findings: Secondary | ICD-10-CM

## 2018-09-27 LAB — LIPID PANEL
Cholesterol: 169 mg/dL (ref 0–200)
HDL: 48.7 mg/dL (ref >39.00)
LDL Cholesterol: 113 mg/dL — ABNORMAL HIGH (ref 0–99)
NonHDL: 120.38
Total CHOL/HDL Ratio: 3
Triglycerides: 35 mg/dL (ref 0.0–149.0)
VLDL: 7 mg/dL (ref 0.0–40.0)

## 2018-10-18 ENCOUNTER — Emergency Department (HOSPITAL_COMMUNITY)
Admission: EM | Admit: 2018-10-18 | Discharge: 2018-10-18 | Disposition: A | Discharge status: Home / Self Care | Payer: BC Managed Care – PPO | Attending: Emergency Medicine | Admitting: Emergency Medicine

## 2018-10-18 ENCOUNTER — Encounter (HOSPITAL_COMMUNITY): Payer: Self-pay

## 2018-10-18 ENCOUNTER — Other Ambulatory Visit: Payer: Self-pay

## 2018-10-18 DIAGNOSIS — M79661 Pain in right lower leg: Secondary | ICD-10-CM | POA: Diagnosis not present

## 2018-10-18 DIAGNOSIS — I10 Essential (primary) hypertension: Secondary | ICD-10-CM | POA: Diagnosis not present

## 2018-10-18 DIAGNOSIS — M7989 Other specified soft tissue disorders: Secondary | ICD-10-CM

## 2018-10-18 DIAGNOSIS — R2241 Localized swelling, mass and lump, right lower limb: Secondary | ICD-10-CM | POA: Diagnosis not present

## 2018-10-18 DIAGNOSIS — Z79899 Other long term (current) drug therapy: Secondary | ICD-10-CM | POA: Diagnosis not present

## 2018-10-18 MED ORDER — ENOXAPARIN SODIUM 100 MG/ML ~~LOC~~ SOLN
1.0000 mg/kg | Freq: Once | SUBCUTANEOUS | Status: AC
  Administered 2018-10-18: 95 mg via SUBCUTANEOUS
  Filled 2018-10-18 (×2): qty 0.95

## 2018-10-18 NOTE — Discharge Instructions (Addendum)
It was my pleasure taking care of you today!   I have ordered your ultrasound for tomorrow morning at 8 AM.  Please go to [facility] emergency department registration desk and let them know you are here for a vascular ultrasound of your right leg.  Return to ER for new or worsening symptoms, any additional concerns.

## 2018-10-18 NOTE — ED Provider Notes (Signed)
[facility] DEPT Provider Note   CSN: [id number] Arrival date & time: 10/18/18  1816     History   Chief Complaint Chief Complaint  Patient presents with  . Leg Swelling    HPI TYLEK BOLGER is a 53 y.o. male.     The history is provided by the patient and medical records. No language interpreter was used.   ARKAN BLOOD is a 53 y.o. male  with a PMH as listed below who presents to the Emergency Department complaining of right leg swelling which he first noticed this morning.  Associated with calf discomfort intermittently today as well.  Patient did have his meniscus repaired on this extremity by Dr. Mardelle Matte in July.  He has been doing physical therapy and felt as if he was healing well.  He called the orthopedic office who recommended that he get an ultrasound done to make sure he did not have a blood clot.  He denies any chest pain or shortness of breath.  No color changes.  No medications taken prior to arrival for symptoms.  Not on any anticoagulants.  Denies any history of DVT/PE in the past or other coagulopathies.  Past Medical History:  Diagnosis Date  . Achilles tendonitis, bilateral 2016   [name], DPM  . Acute medial meniscus tear of right knee   . Acute myocarditis 2015   viral vs d/t strep infection, resolved w/o sequela  . Allergy   . 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 GERD (gastroesophageal reflux disease)   . Hemorrhoids    hx of  . Hepatitis B infection    hx of dx 2005 aprox  . Hyperglycemia 02/22/2015  . Hypertension   . Neck pain    local injections x 2, las summer 2011 (Dr.Vortek)  . Plantar fasciitis, bilateral 2016   [name], DPM  . Sleep apnea    on CPAP     Patient Active Problem List   Diagnosis Date Noted  . Hyperglycemia 02/22/2015  . PCP NOTES >>> 12/28/2014  . OSA on CPAP 07/21/2014  .  Myocarditis ([place])   . Hypertension   . GERD (gastroesophageal reflux disease)   . Hepatitis B infection   . Neck pain--Chronic 11/10/2013  . Annual physical exam 03/29/2011    Past Surgical History:  Procedure Laterality Date  . INGUINAL HERNIA REPAIR    . KNEE ARTHROSCOPY  6-15   Dr Mardelle Matte  . KNEE ARTHROSCOPY WITH MEDIAL MENISECTOMY Right 09/26/2018  . LEFT HEART CATHETERIZATION WITH CORONARY ANGIOGRAM N/A 12/29/2013   Procedure: LEFT HEART CATHETERIZATION WITH CORONARY ANGIOGRAM;  Surgeon: [name], MD;  Location: [facility] CATH LAB;  Service: Cardiovascular;  Laterality: N/A;        Home Medications    Prior to Admission medications   Medication Sig Start Date End Date Taking? Authorizing Provider  cetirizine (ZYRTEC) 10 MG chewable tablet Chew 10 mg by mouth daily.    [provider]  cyclobenzaprine (FLEXERIL) 5 MG tablet Take 1 tablet (5 mg total) by mouth at bedtime. 04/05/18   Saguier, Percell Miller, PA-C  diclofenac (VOLTAREN) 75 MG EC tablet Take 1 tablet (75 mg total) by mouth 2 (two) times daily. 04/05/18   Saguier, Percell Miller, PA-C  losartan (COZAAR) 50 MG tablet Take 1 tablet (50 mg total) by mouth daily. 04/16/18   Colon Branch, MD  OVER THE  COUNTER MEDICATION Place 1-2 drops into both eyes as needed (for dry eyes). "[facility] for Dry Eyes"    [provider]  polyethylene glycol (MIRALAX / GLYCOLAX) packet Take 17 g by mouth daily.    [provider]  Probiotic Product (PROBIOTIC DAILY PO) Take by mouth.    [provider]  psyllium (METAMUCIL) 58.6 % powder Take 1 packet by mouth as needed (for constipation). Reported on 05/07/2015    [provider]  valACYclovir (VALTREX) 1000 MG tablet TAKE 2 TABLETS BY MOUTH TWICE DAILY AS NEEDED FOR OUTBREAKS 10/25/17   Colon Branch, MD    Family History Family History  Problem Relation Age of Onset  . Hypertension Mother   . Stroke Mother        M, Lillia Corporal  . Hypertension Father   . Coronary  artery disease Maternal Grandmother   . Hypertension Maternal Grandmother   . Diabetes Paternal Grandfather   . Prostate cancer Maternal Grandfather 41  . Breast cancer Paternal Grandmother   . Pancreatic cancer Paternal Aunt   . Colon cancer Neg Hx   . Esophageal cancer Neg Hx   . Kidney disease Neg Hx   . Liver cancer Neg Hx   . Rectal cancer Neg Hx   . Stomach cancer Neg Hx     Social History Social History   Tobacco Use  . Smoking status: Never Smoker  . Smokeless tobacco: Never Used  Substance Use Topics  . Alcohol use: No    Alcohol/week: 0.0 standard drinks  . Drug use: No     Allergies   Amoxicillin   Review of Systems Review of Systems  Respiratory: Negative for cough and shortness of breath.   Cardiovascular: Positive for leg swelling. Negative for chest pain and palpitations.  Musculoskeletal: Positive for myalgias.  Skin: Negative for color change.  Neurological: Negative for weakness and numbness.  All other systems reviewed and are negative.    Physical Exam Updated Vital Signs BP (!) 148/89 (BP Location: Left Arm)   Pulse 74   Temp 98.8 F (37.1 C) (Oral)   Resp 18   Wt 93.9 kg   SpO2 100%   BMI 29.70 kg/m   Physical Exam Vitals signs and nursing note reviewed.  Constitutional:      General: He is not in acute distress.    Appearance: He is well-developed.  HENT:     Head: Normocephalic and atraumatic.  Neck:     Musculoskeletal: Neck supple.  Cardiovascular:     Rate and Rhythm: Normal rate and regular rhythm.     Heart sounds: Normal heart sounds. No murmur.  Pulmonary:     Effort: Pulmonary effort is normal. No respiratory distress.     Breath sounds: Normal breath sounds.  Abdominal:     General: There is no distension.     Palpations: Abdomen is soft.     Tenderness: There is no abdominal tenderness.  Musculoskeletal:     Right lower leg: Edema present.     Left lower leg: No edema.  Skin:    General: Skin is warm and  dry.  Neurological:     Mental Status: He is alert and oriented to person, place, and time.      ED Treatments / Results  Labs (all labs ordered are listed, but only abnormal results are displayed) Labs Reviewed - No data to display  EKG None  Radiology No results found.  Procedures Procedures (including critical care  time)  Medications Ordered in ED Medications  enoxaparin (LOVENOX) injection 95 mg (has no administration in time range)     Initial Impression / Assessment and Plan / ED Course  I have reviewed the triage vital signs and the nursing notes.  Pertinent labs & imaging results that were available during my care of the patient were reviewed by me and considered in my medical decision making (see chart for details).        THOMPSON GATO is a 53 y.o. male who presents to ED by recommendation of orthopedist for RLE swelling which he noticed today. NVI on exam. Does have RLE swelling when compared to left. S/p knee surgery July 30th. Unfortunately, vascular ultrasound is not available at this time tonight. Will give Lovenox shot in ED and I have ordered him an ultrasound for 8am tomorrow morning.  No chest pain or shortness of breath to suggest PE. VSS.  Patient in agreement with plan.  Return precautions discussed.  All questions answered.   Final Clinical Impressions(s) / ED Diagnoses   Final diagnoses:  Right leg swelling    ED Discharge Orders         Ordered    [facility]OUS     10/18/18 1945           Darroll Bredeson, Ozella Almond, PA-C 10/18/18 1952    Fredia Sorrow, MD 10/26/18 1210

## 2018-10-18 NOTE — ED Triage Notes (Addendum)
Pt had R knee surgery July 30 of this year. Pt states his R leg has swelling that is new. Pt has been following up with PT. Pt has sharp calf pain that is intermittent. Orthopedic office recommended pt comes have ultrasound to rule out DVT.

## 2018-10-19 ENCOUNTER — Ambulatory Visit (HOSPITAL_BASED_OUTPATIENT_CLINIC_OR_DEPARTMENT_OTHER)
Admission: RE | Admit: 2018-10-19 | Discharge: 2018-10-19 | Disposition: A | Discharge status: Home / Self Care | Payer: BC Managed Care – PPO | Source: Ambulatory Visit | Attending: Emergency Medicine | Admitting: Emergency Medicine

## 2018-10-19 ENCOUNTER — Other Ambulatory Visit: Payer: Self-pay

## 2018-10-19 ENCOUNTER — Encounter (HOSPITAL_COMMUNITY): Payer: Self-pay | Admitting: *Deleted

## 2018-10-19 ENCOUNTER — Emergency Department (HOSPITAL_COMMUNITY)
Admission: EM | Admit: 2018-10-19 | Discharge: 2018-10-19 | Disposition: A | Discharge status: Home / Self Care | Payer: BC Managed Care – PPO | Attending: Emergency Medicine | Admitting: Emergency Medicine

## 2018-10-19 DIAGNOSIS — I10 Essential (primary) hypertension: Secondary | ICD-10-CM | POA: Insufficient documentation

## 2018-10-19 DIAGNOSIS — Z79899 Other long term (current) drug therapy: Secondary | ICD-10-CM | POA: Insufficient documentation

## 2018-10-19 DIAGNOSIS — M7989 Other specified soft tissue disorders: Secondary | ICD-10-CM

## 2018-10-19 DIAGNOSIS — Z7901 Long term (current) use of anticoagulants: Secondary | ICD-10-CM | POA: Insufficient documentation

## 2018-10-19 DIAGNOSIS — I82431 Acute embolism and thrombosis of right popliteal vein: Secondary | ICD-10-CM | POA: Insufficient documentation

## 2018-10-19 LAB — BASIC METABOLIC PANEL
Anion gap: 9 (ref 5–15)
BUN: 12 mg/dL (ref 6–20)
CO2: 27 mmol/L (ref 22–32)
Calcium: 9 mg/dL (ref 8.9–10.3)
Chloride: 104 mmol/L (ref 98–111)
Creatinine, Ser: 1.16 mg/dL (ref 0.61–1.24)
GFR calc Af Amer: >60 mL/min (ref >60)
GFR calc non Af Amer: >60 mL/min (ref >60)
Glucose, Bld: 105 mg/dL — ABNORMAL HIGH (ref 70–99)
Potassium: 4.2 mmol/L (ref 3.5–5.1)
Sodium: 140 mmol/L (ref 135–145)

## 2018-10-19 LAB — CBC WITH DIFFERENTIAL/PLATELET
Abs Immature Granulocytes: 0.03 10*3/uL (ref 0.00–0.07)
Basophils Absolute: 0 10*3/uL (ref 0.0–0.1)
Basophils Relative: 1 %
Eosinophils Absolute: 0.1 10*3/uL (ref 0.0–0.5)
Eosinophils Relative: 1 %
HCT: 40.8 % (ref 39.0–52.0)
Hemoglobin: 12.8 g/dL — ABNORMAL LOW (ref 13.0–17.0)
Immature Granulocytes: 1 %
Lymphocytes Relative: 25 %
Lymphs Abs: 1.4 10*3/uL (ref 0.7–4.0)
MCH: 26.7 pg (ref 26.0–34.0)
MCHC: 31.4 g/dL (ref 30.0–36.0)
MCV: 85.2 fL (ref 80.0–100.0)
Monocytes Absolute: 0.5 10*3/uL (ref 0.1–1.0)
Monocytes Relative: 10 %
Neutro Abs: 3.5 10*3/uL (ref 1.7–7.7)
Neutrophils Relative %: 62 %
Platelets: 274 10*3/uL (ref 150–400)
RBC: 4.79 MIL/uL (ref 4.22–5.81)
RDW: 13.6 % (ref 11.5–15.5)
WBC: 5.6 10*3/uL (ref 4.0–10.5)
nRBC: 0 % (ref 0.0–0.2)

## 2018-10-19 MED ORDER — RIVAROXABAN (XARELTO) EDUCATION KIT FOR DVT/PE PATIENTS
PACK | Freq: Once | Status: AC
  Administered 2018-10-19: 12:00:00
  Filled 2018-10-19: qty 1

## 2018-10-19 MED ORDER — RIVAROXABAN (XARELTO) VTE STARTER PACK (15 & 20 MG): 15.0000 mg | ORAL_TABLET | Freq: Two times a day (BID) | ORAL | 0 refills | Status: DC

## 2018-10-19 MED ORDER — RIVAROXABAN 15 MG PO TABS
15.0000 mg | ORAL_TABLET | Freq: Once | ORAL | Status: AC
  Administered 2018-10-19: 15 mg via ORAL
  Filled 2018-10-19: qty 1

## 2018-10-19 NOTE — ED Provider Notes (Signed)
[facility] DEPARTMENT Provider Note   CSN: [id number] Arrival date & time: 10/19/18  0900     History   Chief Complaint No chief complaint on file.   HPI Gregory Klein is a 53 y.o. male presents to the ER from vascular/ultrasound department for positive ultrasound result. Patient noticed right lower leg swelling yesterday.  Associated with mild "cramping" that is intermittent when he walks in the calf.  He saw his orthopedist Dr. Mardelle Matte who recommended ER evaluation.  He came to the ER yesterday but unfortunately ultrasound was not available at that time so he was given a shot of Lovenox and discharged with scheduled DVT study at 8 AM today.  Patient had right knee meniscus repair last month.  States after the surgery he was pretty active and avoided prolonged sitting.  He works at his computer but also takes frequent breaks.  He denies previous history of DVT/PE.  He denies any anticoagulant use.  He denies chest pain, shortness of breath, palpitations.  No known coagulopathies.  No interventions.  No alleviating factors.     HPI  Past Medical History:  Diagnosis Date  . Achilles tendonitis, bilateral 2016   [name], DPM  . Acute medial meniscus tear of right knee   . Acute myocarditis 2015   viral vs d/t strep infection, resolved w/o sequela  . Allergy   . 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 GERD (gastroesophageal reflux disease)   . Hemorrhoids    hx of  . Hepatitis B infection    hx of dx 2005 aprox  . Hyperglycemia 02/22/2015  . Hypertension   . Neck pain    local injections x 2, las summer 2011 (Dr.Vortek)  . Plantar fasciitis, bilateral 2016   [name], DPM  . Sleep apnea    on CPAP     Patient Active Problem List   Diagnosis Date Noted  . Hyperglycemia 02/22/2015  . PCP NOTES >>> 12/28/2014  . OSA on CPAP 07/21/2014  .  Myocarditis ([facility])   . Hypertension   . GERD (gastroesophageal reflux disease)   . Hepatitis B infection   . Neck pain--Chronic 11/10/2013  . Annual physical exam 03/29/2011    Past Surgical History:  Procedure Laterality Date  . INGUINAL HERNIA REPAIR    . KNEE ARTHROSCOPY  6-15   Dr Mardelle Matte  . KNEE ARTHROSCOPY WITH MEDIAL MENISECTOMY Right 09/26/2018  . LEFT HEART CATHETERIZATION WITH CORONARY ANGIOGRAM N/A 12/29/2013   Procedure: LEFT HEART CATHETERIZATION WITH CORONARY ANGIOGRAM;  Surgeon: [name], MD;  Location: [facility] CATH LAB;  Service: Cardiovascular;  Laterality: N/A;        Home Medications    Prior to Admission medications   Medication Sig Start Date End Date Taking? Authorizing Provider  cetirizine (ZYRTEC) 10 MG chewable tablet Chew 10 mg by mouth daily.    [provider]  cyclobenzaprine (FLEXERIL) 5 MG tablet Take 1 tablet (5 mg total) by mouth at bedtime. 04/05/18   Saguier, Percell Miller, PA-C  diclofenac (VOLTAREN) 75 MG EC tablet Take 1 tablet (75 mg total) by mouth 2 (two) times daily. 04/05/18   Saguier, Percell Miller, PA-C  losartan (COZAAR) 50 MG tablet Take 1 tablet (50 mg total) by mouth daily. 04/16/18   Colon Branch, MD  OVER THE COUNTER MEDICATION Place 1-2 drops into both eyes as needed (for dry eyes). "  [place] for Dry Eyes"    [provider]  polyethylene glycol (MIRALAX / GLYCOLAX) packet Take 17 g by mouth daily.    [provider]  Probiotic Product (PROBIOTIC DAILY PO) Take by mouth.    [provider]  psyllium (METAMUCIL) 58.6 % powder Take 1 packet by mouth as needed (for constipation). Reported on 05/07/2015    [provider]  Rivaroxaban 15 & 20 MG TBPK Take 15 mg by mouth 2 (two) times daily. Take as directed on package: Start with one 25m tablet by mouth twice a day with food. On Day 22, switch to one 226mtablet once a day with food. 10/19/18   GiKinnie FeilPA-C  valACYclovir (VALTREX) 1000 MG tablet  TAKE 2 TABLETS BY MOUTH TWICE DAILY AS NEEDED FOR OUTBREAKS 10/25/17   PaColon BranchMD    Family History Family History  Problem Relation Age of Onset  . Hypertension Mother   . Stroke Mother        M, GFLillia Corporal. Hypertension Father   . Coronary artery disease Maternal Grandmother   . Hypertension Maternal Grandmother   . Diabetes Paternal Grandfather   . Prostate cancer Maternal Grandfather [id number]. Breast cancer Paternal Grandmother   . Pancreatic cancer Paternal Aunt   . Colon cancer Neg Hx   . Esophageal cancer Neg Hx   . Kidney disease Neg Hx   . Liver cancer Neg Hx   . Rectal cancer Neg Hx   . Stomach cancer Neg Hx     Social History Social History   Tobacco Use  . Smoking status: Never Smoker  . Smokeless tobacco: Never Used  Substance Use Topics  . Alcohol use: No    Alcohol/week: 0.0 standard drinks  . Drug use: No     Allergies   Amoxicillin   Review of Systems Review of Systems  Cardiovascular: Positive for leg swelling.  Musculoskeletal: Positive for myalgias.  All other systems reviewed and are negative.    Physical Exam Updated Vital Signs BP (!) 137/96 (BP Location: Right Arm)   Pulse 73   Temp 98 F (36.7 C) (Oral)   Resp 17   Ht _0  (1.803 m)   Wt 93.9 kg   SpO2 100%   BMI 28.87 kg/m   Physical Exam Vitals signs and nursing note reviewed.  Constitutional:      General: He is not in acute distress.    Appearance: He is well-developed.     Comments: NAD.  HENT:     Head: Normocephalic and atraumatic.     Right Ear: External ear normal.     Left Ear: External ear normal.     Nose: Nose normal.  Eyes:     General: No scleral icterus.    Conjunctiva/sclera: Conjunctivae normal.  Neck:     Musculoskeletal: Normal range of motion and neck supple.  Cardiovascular:     Rate and Rhythm: Normal rate and regular rhythm.     Heart sounds: Normal heart sounds.     Comments: Mild asymmetric edema distal to the right mid calf.  1+ popliteal,  PT and DP pulses bilaterally.  Right feet is warm and well-perfused.  No phlegmasia. Pulmonary:     Effort: Pulmonary effort is normal.     Breath sounds: Normal breath sounds.  Musculoskeletal: Normal range of motion.        General: No deformity.  Skin:    General: Skin is warm  and dry.     Capillary Refill: Capillary refill takes less than 2 seconds.     Comments: No right lower extremity erythema, warmth, lesions.  Neurological:     Mental Status: He is alert and oriented to person, place, and time.     Comments: Sensation and strength is intact in right lower extremity.  Psychiatric:        Behavior: Behavior normal.        Thought Content: Thought content normal.        Judgment: Judgment normal.      ED Treatments / Results  Labs (all labs ordered are listed, but only abnormal results are displayed) Labs Reviewed  CBC WITH DIFFERENTIAL/PLATELET - Abnormal; Notable for the following components:      Result Value   Hemoglobin 12.8 (*)    All other components within normal limits  BASIC METABOLIC PANEL - Abnormal; Notable for the following components:   Glucose, Bld 105 (*)    All other components within normal limits    EKG None  Radiology Le Venous  Result Date: 10/19/2018  Lower Venous Study Indications: Swelling.  Comparison Study: No prior study on file for comparison. Performing Technologist: Sharion Dove RVS  Examination Guidelines: A complete evaluation includes B-mode imaging, spectral Doppler, color Doppler, and power Doppler as needed of all accessible portions of each vessel. Bilateral testing is considered an integral part of a complete examination. Limited examinations for reoccurring indications may be performed as noted.  +---------+---------------+---------+-----------+----------+--------------+ RIGHT    CompressibilityPhasicitySpontaneityPropertiesThrombus Aging +---------+---------------+---------+-----------+----------+--------------+ CFV       Full           Yes      Yes                                 +---------+---------------+---------+-----------+----------+--------------+ SFJ      Full                                                        +---------+---------------+---------+-----------+----------+--------------+ FV Prox  Full                                                        +---------+---------------+---------+-----------+----------+--------------+ FV Mid   Full                                                        +---------+---------------+---------+-----------+----------+--------------+ FV DistalFull                                                        +---------+---------------+---------+-----------+----------+--------------+ PFV      Full                                                        +---------+---------------+---------+-----------+----------+--------------+  POP      Full           Yes      Yes                                 +---------+---------------+---------+-----------+----------+--------------+ PTV      Full                                                        +---------+---------------+---------+-----------+----------+--------------+ PERO     Full                                                        +---------+---------------+---------+-----------+----------+--------------+   +----+---------------+---------+-----------+----------+--------------+ LEFTCompressibilityPhasicitySpontaneityPropertiesThrombus Aging +----+---------------+---------+-----------+----------+--------------+ CFV Full           Yes      Yes                                 +----+---------------+---------+-----------+----------+--------------+     Summary: Right: There is no evidence of deep vein thrombosis in the lower extremity. Ultrasound characteristics of enlarged lymph nodes are noted in the groin. Left: No evidence of common femoral vein obstruction. Ultrasound  characteristics of enlarged lymph nodes noted in the groin.  *See table(s) above for measurements and observations.    Preliminary     Procedures Procedures (including critical care time)  Medications Ordered in ED Medications  rivaroxaban [employer] Reasons) Education Kit for DVT/PE patients ( Does not apply Given 10/19/18 1153)  Rivaroxaban (XARELTO) tablet 15 mg (15 mg Oral Given 10/19/18 1153)     Initial Impression / Assessment and Plan / ED Course  I have reviewed the triage vital signs and the nursing notes.  Pertinent labs & imaging results that were available during my care of the patient were reviewed by me and considered in my medical decision making (see chart for details).        I have reviewed patient's EMR to obtain pertinent PMH.  I reviewed vascular ultrasound that shows acute DVT in the right popliteal, posterior tib and peroneal veins.  Clinically there are no signs of phlegmasia, ischemic leg, compartment syndrome, cellulitis and history and exam is not consistent with these diagnoses.  Lab work reviewed by me, normal hemoglobin, PLT, WBC and creatinine.  Patient is very low risk for bleeding on anticoagulants given age, no h/o previous bleeding, cancer, renal/liver disease, thrombocytopenia, anemia, reduced functional capacity, hemorrhage, frequent falls or ETOH abuse. At this time I think anticoagulants benefit is greater than risk. Pt will be discharged with Eliquis.  Pharmacy has met with patient and provided education on this medicine and Eliquis For the next 30 days.  Patient has no symptoms of PE including chest pain, shortness of breath, tachycardia, hypoxemia, tachypnea.  I have very low suspicion for this.  Discussed plan of care with patient who is in agreement.  Recommended follow-up with PCP in the next 7 to 10 days for reevaluation.  Return precautions discussed.  Patient is comfortable with this.   Final Clinical Impressions(s) / ED Diagnoses  Final diagnoses:   Acute deep vein thrombosis (DVT) of popliteal vein of right lower extremity [facility])    ED Discharge Orders         Ordered    Rivaroxaban 15 & 20 MG TBPK  2 times daily     10/19/18 1231           Arlean Hopping 10/19/18 1232    Pattricia Boss, MD 10/19/18 [phone]

## 2018-10-19 NOTE — Progress Notes (Addendum)
VASCULAR LAB PRELIMINARY  PRELIMINARY  PRELIMINARY  PRELIMINARY  Right lower extremity venous duplex completed.    Preliminary report:  Patient has acute DVT noted in the right popliteal, posterior tibial, and peroneal veins. Preliminary report in Epic is for another patient and shows incorrect information for Mr. Arvidson.  Please call [phone] with any questions. Thank you.  Adora Fridge, RN  Meilani Edmundson, RVT 10/19/2018, 9:12 AM

## 2018-10-19 NOTE — Discharge Instructions (Addendum)
You were seen in the ER for leg swelling.  Ultrasound confirmed right lower extremity blood clot in your veins.  This is treated with a blood thinner, elevation.  If there is associated pain you can alternate Tylenol every 6-8 hours.  Take Eliquis as prescribed.  Return to the ER for worsening severe swelling, pain, redness, warmth, fevers, chest pain, shortness of breath, coughing up blood.

## 2018-10-19 NOTE — Progress Notes (Signed)
ANTICOAGULATION CONSULT NOTE - Initial Consult  Pharmacy Consult for xarelto Indication: DVT  Allergies  Allergen Reactions  . Amoxicillin Rash    Patient Measurements: Height: 5\' 11"  (180.3 cm) Weight: 207 lb (93.9 kg) IBW/kg (Calculated) : 75.3  Vital Signs: Temp: 98 F (36.7 C) (08/22 0911) Temp Source: Oral (08/22 0911) BP: 141/92 (08/22 0911) Pulse Rate: 66 (08/22 0911)  Labs: Recent Labs    10/19/18 0917  HGB 12.8*  HCT 40.8  PLT 274    CrCl cannot be calculated (Patient's most recent lab result is older than the maximum 21 days allowed.).   Medical History: Past Medical History:  Diagnosis Date  . Achilles tendonitis, bilateral 2016   [name], DPM  . Acute medial meniscus tear of right knee   . Acute myocarditis 2015   viral vs d/t strep infection, resolved w/o sequela  . Allergy   . 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 GERD (gastroesophageal reflux disease)   . Hemorrhoids    hx of  . Hepatitis B infection    hx of dx 2005 aprox  . Hyperglycemia 02/22/2015  . Hypertension   . Neck pain    local injections x 2, las summer 2011 (Dr.Vortek)  . Plantar fasciitis, bilateral 2016   [name], DPM  . Sleep apnea    on CPAP     Assessment: 45 yom presented to the ED with a new DVT. To start xarelto. Baseline Hgb is 12.8 and platelets are WNL. He is not on anticoagulation PTA.   Goal of Therapy:  Therapeutic anticoagulation Monitor platelets by anticoagulation protocol: Yes   Plan:  Xarelto 15mg  PO BID x 21 days then 20mg  daily thereafter - pt provided first dose here Pt educated on xarelto and provided a coupon card  Jaziah Kwasnik, Rande Lawman 10/19/2018,11:42 AM

## 2018-10-19 NOTE — ED Notes (Signed)
Patient Alert and oriented to baseline. Stable and ambulatory to baseline. Patient verbalized understanding of the discharge instructions.  Patient belongings were taken by the patient.   

## 2018-10-19 NOTE — ED Triage Notes (Signed)
Surgery on July 12 everything was goint good yest. Noticed right ankle was swelling and calf was swelling , went to see PCP and was sent to ED for doppler study and test was positive for DVT

## 2018-10-21 ENCOUNTER — Telehealth: Payer: Self-pay

## 2018-10-21 NOTE — Telephone Encounter (Signed)
Pt. Reports he was seen in ED for DVT this weekend and has an appointment this Friday for f/u. Would an appointment sooner. Warm transfer to [place] in the practice.

## 2018-10-22 ENCOUNTER — Ambulatory Visit (INDEPENDENT_AMBULATORY_CARE_PROVIDER_SITE_OTHER): Payer: BC Managed Care – PPO | Admitting: Internal Medicine

## 2018-10-22 ENCOUNTER — Encounter: Payer: Self-pay | Admitting: Internal Medicine

## 2018-10-22 ENCOUNTER — Ambulatory Visit (HOSPITAL_BASED_OUTPATIENT_CLINIC_OR_DEPARTMENT_OTHER)
Admission: RE | Admit: 2018-10-22 | Discharge: 2018-10-22 | Disposition: A | Discharge status: Home / Self Care | Payer: BC Managed Care – PPO | Source: Ambulatory Visit | Attending: Internal Medicine | Admitting: Internal Medicine

## 2018-10-22 ENCOUNTER — Other Ambulatory Visit: Payer: Self-pay

## 2018-10-22 VITALS — BP 145/99 | HR 80 | Temp 98.4°F | Resp 16 | Ht 70.0 in | Wt 207.0 lb

## 2018-10-22 DIAGNOSIS — I82431 Acute embolism and thrombosis of right popliteal vein: Secondary | ICD-10-CM | POA: Diagnosis present

## 2018-10-22 MED ORDER — RIVAROXABAN 20 MG PO TABS: 20.0000 mg | ORAL_TABLET | Freq: Every day | ORAL | 1 refills | Status: DC

## 2018-10-22 NOTE — Progress Notes (Signed)
Pre visit review using our clinic review tool, if applicable. No additional management support is needed unless otherwise documented below in the visit note. 

## 2018-10-22 NOTE — Patient Instructions (Addendum)
  GO TO THE FRONT DESK Schedule your next appointment   for a checkup in 10 weeks  Go to the first floor and get your ultrasound   Once you finish the starting package of Xarelto, take 1 tablet daily for 3 months.   Leg elevation  Do not take any NSAIDs including indomethacin, ibuprofen, naproxen.  Tylenol is okay.   Call if you get more swelling in your legs  ER if chest pain, difficulty breathing.

## 2018-10-22 NOTE — Progress Notes (Signed)
Subjective:    Patient ID: Gregory Klein, male    DOB: [birth year], 53 y.o.   MRN: [id number]  DOS:  10/22/2018 Type of visit - description: ED follow-up S/p Right knee arthroscopy with partial medial meniscectomy with chondroplasty on 09/26/2018  10/18/2018,The patient went to the ER with right leg swelling, right calf discomfort. The next day, he had a ultrasound:  Right DVT at the right popliteal vein,  the right posterior tibial veins and the right peroneal vein. Left leg negative  CBC showed a hemoglobin of 12.8 (previously 13.8), BMP satisfactory.   Review of Systems He is here for follow-up. Reports the calf swelling has decreased No major problems with pain at the calf however this morning he had pain  At the lateral aspect of the R quadriceps area .  No fever chills No chest pain no difficulty breathing No palpitation No nausea or vomiting No blood in the stools No blood in the urine  Past Medical History:  Diagnosis Date  . Achilles tendonitis, bilateral 2016   [name], DPM  . Acute medial meniscus tear of right knee   . Acute myocarditis 2015   viral vs d/t strep infection, resolved w/o sequela  . Allergy   . 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 GERD (gastroesophageal reflux disease)   . Hemorrhoids    hx of  . Hepatitis B infection    hx of dx 2005 aprox  . Hyperglycemia 02/22/2015  . Hypertension   . Neck pain    local injections x 2, las summer 2011 (Dr.Vortek)  . Plantar fasciitis, bilateral 2016   [name], DPM  . Sleep apnea    on CPAP     Past Surgical History:  Procedure Laterality Date  . INGUINAL HERNIA REPAIR    . KNEE ARTHROSCOPY  6-15   Dr Mardelle Matte  . KNEE ARTHROSCOPY WITH MEDIAL MENISECTOMY Right 09/26/2018  . LEFT HEART CATHETERIZATION WITH CORONARY ANGIOGRAM N/A 12/29/2013   Procedure: LEFT HEART CATHETERIZATION WITH CORONARY  ANGIOGRAM;  Surgeon: [name], MD;  Location: [facility] CATH LAB;  Service: Cardiovascular;  Laterality: N/A;    Social History   Socioeconomic History  . Marital status: Single    Spouse name: Not on file  . Number of children: 0  . Years of education: Not on file  . Highest education level: Not on file  Occupational History  . Occupation: Pension scheme manager: [employer]  Social Needs  . Financial resource strain: Not on file  . Food insecurity    Worry: Not on file    Inability: Not on file  . Transportation needs    Medical: Not on file    Non-medical: Not on file  Tobacco Use  . Smoking status: Never Smoker  . Smokeless tobacco: Never Used  Substance and Sexual Activity  . Alcohol use: No    Alcohol/week: 0.0 standard drinks  . Drug use: No  . Sexual activity: Not on file  Lifestyle  . Physical activity    Days per week: Not on file    Minutes per session: Not on file  . Stress: Not on file  Relationships  . Social Herbalist on phone: Not on file    Gets together: Not on file    Attends religious service: Not on file    Active member of  club or organization: Not on file    Attends meetings of clubs or organizations: Not on file    Relationship status: Not on file  . Intimate partner violence    Fear of current or ex partner: Not on file    Emotionally abused: Not on file    Physically abused: Not on file    Forced sexual activity: Not on file  Other Topics Concern  . Not on file  Social History Narrative    Lives by himself      Allergies as of 10/22/2018      Reactions   Amoxicillin Rash      Medication List       Accurate as of October 22, 2018  9:49 AM. If you have any questions, ask your nurse or doctor.        cetirizine 10 MG chewable tablet Commonly known as: ZYRTEC Chew 10 mg by mouth daily.   cyclobenzaprine 5 MG tablet Commonly known as: FLEXERIL Take 1 tablet (5 mg total) by mouth at bedtime.    diclofenac 75 MG EC tablet Commonly known as: VOLTAREN Take 1 tablet (75 mg total) by mouth 2 (two) times daily.   losartan 50 MG tablet Commonly known as: COZAAR Take 1 tablet (50 mg total) by mouth daily.   OVER THE COUNTER MEDICATION Place 1-2 drops into both eyes as needed (for dry eyes). "[employer] [place] for Dry Eyes"   polyethylene glycol 17 g packet Commonly known as: MIRALAX / GLYCOLAX Take 17 g by mouth daily.   PROBIOTIC DAILY PO Take by mouth.   psyllium 58.6 % powder Commonly known as: METAMUCIL Take 1 packet by mouth as needed (for constipation). Reported on 05/07/2015   Rivaroxaban 15 & 20 MG Tbpk Take 15 mg by mouth 2 (two) times daily. Take as directed on package: Start with one 15mg  tablet by mouth twice a day with food. On Day 22, switch to one 20mg  tablet once a day with food.   valACYclovir 1000 MG tablet Commonly known as: VALTREX TAKE 2 TABLETS BY MOUTH TWICE DAILY AS NEEDED FOR OUTBREAKS           Objective:   Physical Exam BP (!) 145/99 (BP Location: Left Arm, Patient Position: Sitting, Cuff Size: Normal)   Pulse 80   Temp 98.4 F (36.9 C) (Oral)   Resp 16   Ht 5\' 10"  (1.778 m)   Wt 207 lb (93.9 kg)   SpO2 100%   BMI 29.70 kg/m  General:   Well developed, NAD, BMI noted. HEENT:  Normocephalic . Face symmetric, atraumatic Lower extremities: Left leg normal Right leg: Calf is 0.7 inches larger in circumference, no TTP.  Some swelling at the peri-ankle area as well.  The leg -slightly proximal from the knee - is not swollen or tender. Neurologic:  alert & oriented X3.  Speech normal, gait appropriate for age and unassisted Psych--  Cognition and judgment appear intact.  Cooperative with normal attention span and concentration.  Behavior appropriate. No anxious or depressed appearing.      Assessment     Assessment  Prediabetes A1C 5.9 (12-2014) HTN OSA on CPAP DVT: 09/2018: R leg DVT 3 weeks after R knee arthoscopy H/o cold sores  , on valtrex  H/o Acute myocarditis --- 2015 , had a cath, problem related to strep, no sequela H/o Suspected cirrhosis, disproved by a negative biopsy 01/2014 H/o hep B infex 2015 H/o Neck pain, s/p 2 injections 2011 (Dr. Adline Peals),  occ numbness R thumb, no neck pain as of 05-2015 Urolithiasis ER 04-30-25  Plan: Provoked right leg DVT: As described above, I explained the patient what a DVT is, what of the potential complications including embolism, also extension of the clot.  Recommend to avoid NSAIDs.  Watch for evidence of blood in the stools or urine. He has discomfort at the proximal leg, no swelling, he is extremely anxious about it like to be sure that the clot has not extended. Plan: Ultrasound today to be sure the clot has not extended Continue Xarelto, after starting packaging 1 tablet daily, Rx sent. Addendum: Ultrasound today showing distal right femoral-popliteal DVT, it was done at a different facility.  I spoke with vascular surgery Dr. Carlis Abbott who read the original ultrasound, his report did not indicate any femoral clot however the clot seen today is at the  distal thigh and his suspicion for extension is minimal.   Plan: Advised patient plan is the same, call if increased swelling or pain in the right leg.  He verbalized understanding

## 2018-10-23 NOTE — Assessment & Plan Note (Signed)
Provoked right leg DVT: As described above, I explained the patient what a DVT is, what of the potential complications including embolism, also extension of the clot.  Recommend to avoid NSAIDs.  Watch for evidence of blood in the stools or urine. He has discomfort at the proximal leg, no swelling, he is extremely anxious about it like to be sure that the clot has not extended. Plan: Ultrasound today to be sure the clot has not extended Continue Xarelto, after starting packaging 1 tablet daily, Rx sent. Addendum: Ultrasound today showing distal right femoral-popliteal DVT, it was done at a different facility.  I spoke with vascular surgery Dr. Carlis Abbott who read the original ultrasound, his report did not indicate any femoral clot however the clot seen today is at the  distal thigh and his suspicion for extension is minimal.   Plan: Advised patient plan is the same, call if increased swelling or pain in the right leg.  He verbalized understanding

## 2018-10-25 ENCOUNTER — Ambulatory Visit: Payer: BC Managed Care – PPO | Admitting: Internal Medicine

## 2018-10-28 ENCOUNTER — Encounter: Payer: BC Managed Care – PPO | Admitting: Internal Medicine

## 2018-11-09 ENCOUNTER — Other Ambulatory Visit: Payer: Self-pay | Admitting: Internal Medicine

## 2018-12-16 ENCOUNTER — Telehealth: Payer: Self-pay

## 2018-12-16 DIAGNOSIS — I82431 Acute embolism and thrombosis of right popliteal vein: Secondary | ICD-10-CM

## 2018-12-16 NOTE — Telephone Encounter (Signed)
Copied from [place] [id number]. Topic: General - Other >> Dec 16, 2018  4:13 PM Klein, Gregory wrote: Reason for CRM: Pt stated he needs to have an ultrasound done due to a blood clot that he had. Pt stated that he would like to make sure that the blood clot is no longer present. Pt also requests that Dr. [name] nurse return his call because he would like to order a full panel of labs to be done during his appt on 12/31/18. Pt stated he will not be available tomorrow due to a death in his family but he will be available anytime on Wednesday. Pt requests call back

## 2018-12-16 NOTE — Telephone Encounter (Signed)
Please advise 

## 2018-12-17 NOTE — Telephone Encounter (Signed)
Order right lower extremity ultrasound, DX follow-up DVT. As far as blood work, will discuss at the next visit

## 2018-12-17 NOTE — Telephone Encounter (Signed)
Ultrasound order placed. Will call later as Pt mentioned he would not be available today d/t death in family.

## 2018-12-17 NOTE — Addendum Note (Signed)
Addended by: Damita Dunnings D on: 12/17/2018 10:16 AM   Modules accepted: Orders

## 2018-12-18 NOTE — Telephone Encounter (Signed)
F/u ultrasound already scheduled for 12/26/2018. Pt has in office f/u w/ PCP on 12/31/2018- per [name] will complete labs at appt.

## 2018-12-26 ENCOUNTER — Other Ambulatory Visit: Payer: Self-pay | Admitting: Internal Medicine

## 2018-12-26 ENCOUNTER — Other Ambulatory Visit: Payer: Self-pay

## 2018-12-26 ENCOUNTER — Ambulatory Visit (HOSPITAL_BASED_OUTPATIENT_CLINIC_OR_DEPARTMENT_OTHER)
Admission: RE | Admit: 2018-12-26 | Discharge: 2018-12-26 | Disposition: A | Discharge status: Home / Self Care | Payer: BC Managed Care – PPO | Source: Ambulatory Visit | Attending: Internal Medicine | Admitting: Internal Medicine

## 2018-12-26 DIAGNOSIS — I82431 Acute embolism and thrombosis of right popliteal vein: Secondary | ICD-10-CM | POA: Insufficient documentation

## 2018-12-27 NOTE — Telephone Encounter (Signed)
Does Pt need to continue Xarelto?

## 2018-12-27 NOTE — Telephone Encounter (Signed)
Continue Xarelto, will discuss more when he comes back 12/31/2018

## 2018-12-31 ENCOUNTER — Encounter: Payer: Self-pay | Admitting: Internal Medicine

## 2018-12-31 ENCOUNTER — Other Ambulatory Visit: Payer: Self-pay

## 2018-12-31 ENCOUNTER — Ambulatory Visit (INDEPENDENT_AMBULATORY_CARE_PROVIDER_SITE_OTHER): Payer: BC Managed Care – PPO | Admitting: Internal Medicine

## 2018-12-31 VITALS — BP 118/88 | HR 72 | Temp 97.1°F | Resp 16 | Ht 70.0 in | Wt 200.1 lb

## 2018-12-31 DIAGNOSIS — Z23 Encounter for immunization: Secondary | ICD-10-CM | POA: Diagnosis not present

## 2018-12-31 DIAGNOSIS — Z125 Encounter for screening for malignant neoplasm of prostate: Secondary | ICD-10-CM

## 2018-12-31 DIAGNOSIS — Z0000 Encounter for general adult medical examination without abnormal findings: Secondary | ICD-10-CM | POA: Diagnosis not present

## 2018-12-31 DIAGNOSIS — I82431 Acute embolism and thrombosis of right popliteal vein: Secondary | ICD-10-CM

## 2018-12-31 LAB — BASIC METABOLIC PANEL
BUN: 14 mg/dL (ref 6–23)
CO2: 32 mEq/L (ref 19–32)
Calcium: 9.1 mg/dL (ref 8.4–10.5)
Chloride: 100 mEq/L (ref 96–112)
Creatinine, Ser: 1.06 mg/dL (ref 0.40–1.50)
GFR: 88.84 mL/min (ref >60.00)
Glucose, Bld: 81 mg/dL (ref 70–99)
Potassium: 4.2 mEq/L (ref 3.5–5.1)
Sodium: 138 mEq/L (ref 135–145)

## 2018-12-31 LAB — CBC WITH DIFFERENTIAL/PLATELET
Basophils Absolute: 0 10*3/uL (ref 0.0–0.1)
Basophils Relative: 0.7 % (ref 0.0–3.0)
Eosinophils Absolute: 0 10*3/uL (ref 0.0–0.7)
Eosinophils Relative: 0.2 % (ref 0.0–5.0)
HCT: 43.2 % (ref 39.0–52.0)
Hemoglobin: 13.9 g/dL (ref 13.0–17.0)
Lymphocytes Relative: 24.5 % (ref 12.0–46.0)
Lymphs Abs: 1.1 10*3/uL (ref 0.7–4.0)
MCHC: 32.2 g/dL (ref 30.0–36.0)
MCV: 83 fl (ref 78.0–100.0)
Monocytes Absolute: 0.4 10*3/uL (ref 0.1–1.0)
Monocytes Relative: 9 % (ref 3.0–12.0)
Neutro Abs: 3 10*3/uL (ref 1.4–7.7)
Neutrophils Relative %: 65.6 % (ref 43.0–77.0)
Platelets: 259 10*3/uL (ref 150.0–400.0)
RBC: 5.21 Mil/uL (ref 4.22–5.81)
RDW: 15.2 % (ref 11.5–15.5)
WBC: 4.6 10*3/uL (ref 4.0–10.5)

## 2018-12-31 LAB — SARS-COV-2 IGG: SARS-COV-2 IgG: 0.03

## 2018-12-31 LAB — VITAMIN D 25 HYDROXY (VIT D DEFICIENCY, FRACTURES): VITD: 11.09 ng/mL — ABNORMAL LOW (ref 30.00–100.00)

## 2018-12-31 LAB — PSA: PSA: 0.7 ng/mL (ref 0.10–4.00)

## 2018-12-31 MED ORDER — HYDROCORTISONE ACETATE 25 MG RE SUPP: 25.0000 mg | Freq: Two times a day (BID) | RECTAL | 1 refills | Status: DC | PRN

## 2018-12-31 NOTE — Progress Notes (Signed)
Subjective:    Patient ID: Gregory Klein, male    DOB: 30-Nov-1965, 53 y.o.   MRN: [id number]  DOS:  12/31/2018 Type of visit - description: CPX Since the last office visit he is doing well. Reports some constipation, he thinks related to Xarelto, have seen red blood in the toilet paper, started to use Preparation H and the amount of blood is decreased. Occasional discomfort with BMs, no itching. Request PSA and DRE  Review of Systems  Other than above, a 14 point review of systems is negative    Past Medical History:  Diagnosis Date  . Achilles tendonitis, bilateral 2016   [name], DPM  . Acute medial meniscus tear of right knee   . Acute myocarditis 2015   viral vs d/t strep infection, resolved w/o sequela  . Allergy   . 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 GERD (gastroesophageal reflux disease)   . Hemorrhoids    hx of  . Hepatitis B infection    hx of dx 2005 aprox  . Hyperglycemia 02/22/2015  . Hypertension   . Neck pain    local injections x 2, las summer 2011 (Dr.Vortek)  . Plantar fasciitis, bilateral 2016   [name], DPM  . Sleep apnea    on CPAP     Past Surgical History:  Procedure Laterality Date  . INGUINAL HERNIA REPAIR    . KNEE ARTHROSCOPY  6-15   Dr Mardelle Matte  . KNEE ARTHROSCOPY WITH MEDIAL MENISECTOMY Right 09/26/2018  . LEFT HEART CATHETERIZATION WITH CORONARY ANGIOGRAM N/A 12/29/2013   Procedure: LEFT HEART CATHETERIZATION WITH CORONARY ANGIOGRAM;  Surgeon: [name], MD;  Location: [facility] CATH LAB;  Service: Cardiovascular;  Laterality: N/A;    Social History   Socioeconomic History  . Marital status: Single    Spouse name: Not on file  . Number of children: 0  . Years of education: Not on file  . Highest education level: Not on file  Occupational History  . Occupation: Pension scheme manager: [employer]  Social  Needs  . Financial resource strain: Not on file  . Food insecurity    Worry: Not on file    Inability: Not on file  . Transportation needs    Medical: Not on file    Non-medical: Not on file  Tobacco Use  . Smoking status: Never Smoker  . Smokeless tobacco: Never Used  Substance and Sexual Activity  . Alcohol use: No    Alcohol/week: 0.0 standard drinks  . Drug use: No  . Sexual activity: Not on file  Lifestyle  . Physical activity    Days per week: Not on file    Minutes per session: Not on file  . Stress: Not on file  Relationships  . Social Herbalist on phone: Not on file    Gets together: Not on file    Attends religious service: Not on file    Active member of club or organization: Not on file    Attends meetings of clubs or organizations: Not on file    Relationship status: Not on file  . Intimate partner violence    Fear of current or ex partner: Not on file    Emotionally abused: Not on file    Physically abused: Not on file    Forced sexual activity: Not on file  Other Topics Concern  . Not on file  Social History Narrative    Lives by himself     Family History  Problem Relation Age of Onset  . Hypertension Mother   . Stroke Mother        M, Lillia Corporal  . Hypertension Father   . Coronary artery disease Maternal Grandmother   . Hypertension Maternal Grandmother   . Diabetes Paternal Grandfather   . Prostate cancer Maternal Grandfather 73  . Breast cancer Paternal Grandmother   . Pancreatic cancer Paternal Aunt   . Colon cancer Neg Hx   . Esophageal cancer Neg Hx   . Kidney disease Neg Hx   . Liver cancer Neg Hx   . Rectal cancer Neg Hx   . Stomach cancer Neg Hx      Allergies as of 12/31/2018      Reactions   Amoxicillin Rash      Medication List       Accurate as of December 31, 2018 11:59 PM. If you have any questions, ask your nurse or doctor.        cetirizine 10 MG chewable tablet Commonly known as: ZYRTEC Chew 10 mg by mouth  daily.   cyclobenzaprine 5 MG tablet Commonly known as: FLEXERIL Take 1 tablet (5 mg total) by mouth at bedtime.   hydrocortisone 25 MG suppository Commonly known as: ANUSOL-HC Place 1 suppository (25 mg total) rectally 2 (two) times daily as needed for hemorrhoids. Started by: Kathlene November, MD   losartan 50 MG tablet Commonly known as: COZAAR Take 1 tablet (50 mg total) by mouth daily.   OVER THE COUNTER MEDICATION Place 1-2 drops into both eyes as needed (for dry eyes). "[place] for Dry Eyes"   polyethylene glycol 17 g packet Commonly known as: MIRALAX / GLYCOLAX Take 17 g by mouth daily.   PROBIOTIC DAILY PO Take by mouth.   psyllium 58.6 % powder Commonly known as: METAMUCIL Take 1 packet by mouth as needed (for constipation). Reported on 05/07/2015   rivaroxaban 20 MG Tabs tablet Commonly known as: Xarelto Take 1 tablet (20 mg total) by mouth daily with supper.   valACYclovir 1000 MG tablet Commonly known as: VALTREX TAKE 2 TABLETS BY MOUTH TWICE DAILY AS NEEDED FOR OUTBREAKS           Objective:   Physical Exam BP 118/88 (BP Location: Left Arm, Patient Position: Sitting, Cuff Size: Normal)   Pulse 72   Temp (!) 97.1 F (36.2 C) (Temporal)   Resp 16   Ht 5\' 10"  (1.778 m)   Wt 200 lb 2 oz (90.8 kg)   SpO2 100%   BMI 28.71 kg/m  General: Well developed, NAD, BMI noted Neck: No  thyromegaly  HEENT:  Normocephalic . Face symmetric, atraumatic Lungs:  CTA B Normal respiratory effort, no intercostal retractions, no accessory muscle use. Heart: RRR,  no murmur.  No pretibial edema bilaterally  Abdomen:  Not distended, soft, non-tender. No rebound or rigidity.   Skin: Exposed areas without rash. Not pale. Not jaundice DRE: External examination normal, sphincter normal to high pressure, no stools, blood or mucus found.  Prostate was very hard to reach but seems normal. Neurologic:  alert & oriented X3.  Speech normal, gait appropriate for age and  unassisted Strength symmetric and appropriate for age.  Psych: Cognition and judgment appear intact.  Cooperative with normal attention span and concentration.  Behavior appropriate. No anxious or depressed appearing.  Assessment     Assessment  Prediabetes A1C 5.9 (12-2014) HTN OSA on CPAP DVT: 09/2018: R leg DVT 3 weeks after R knee arthoscopy H/o cold sores , on valtrex  H/o Acute myocarditis --- 2015 , had a cath, problem related to strep, no sequela H/o Suspected cirrhosis, disproved by a negative biopsy 01/2014 H/o hep B infex 2015 H/o Neck pain, s/p 2 injections 2011 (Dr. Adline Peals), occ numbness R thumb, no neck pain as of 05-2015 Urolithiasis ER 04-30-25  PLAN Prediabetes: Doing great with diet, last A1c very good, cholesterol very good. HTN: Blood pressure great, continue losartan.  Recheck in BMP OSA: On CPAP Internal hemorrhoids: seen at last colonoscopy: Occasional bleed, Anusol suppositories prescribed.  Call if not better DVT: On Xarelto, last US showed only a small residual clot.  He was referred to vascular surgery by ortho  and has an appointment 01/16/2019.  We agreed that he will stay on Xarelto until he sees vascular surgery, further advised per them.  He is reminded that from this point on he will be at a higher risk for future clots. RTC 1 year

## 2018-12-31 NOTE — Progress Notes (Signed)
Pre visit review using our clinic review tool, if applicable. No additional management support is needed unless otherwise documented below in the visit note. 

## 2018-12-31 NOTE — Patient Instructions (Addendum)
GO TO THE LAB : Get the blood work     GO TO THE FRONT DESK Schedule your next appointment   for a physical exam in 1 year 

## 2019-01-01 NOTE — Assessment & Plan Note (Signed)
-  Tdap 2019 - PNA (23): 12/28/13  - flu shot today -CCS- 04/17/02;  cscope 07/2017 + polyp, 5 years -Prostate cancer screening: DRE - PSA wnl 2019, request a DRE (wnl) & PSA.   -diet,exercise: doing great, change diet, + wt loos; . -Labs: Recent FLP very good.  We will check PSA, CBC, BMP, vitamin D , COVID-19 serology per patient request.  He is aware his insurance may not pay for serology.

## 2019-01-01 NOTE — Assessment & Plan Note (Addendum)
Prediabetes: Doing great with diet, last A1c very good, cholesterol very good. HTN: Blood pressure great, continue losartan.  Recheck in BMP OSA: On CPAP Internal hemorrhoids: seen at last colonoscopy: Occasional bleed, Anusol suppositories prescribed.  Call if not better DVT: On Xarelto, last US showed only a small residual clot.  He was referred to vascular surgery by ortho  and has an appointment 01/16/2019.  We agreed that he will stay on Xarelto until he sees vascular surgery, further advised per them.  He is reminded that from this point on he will be at a higher risk for future clots. RTC 1 year

## 2019-01-02 MED ORDER — VITAMIN D (ERGOCALCIFEROL) 1.25 MG (50000 UNIT) PO CAPS: 50000.0000 [IU] | ORAL_CAPSULE | ORAL | 0 refills | Status: DC

## 2019-01-02 NOTE — Addendum Note (Signed)
Addended byDamita Dunnings D on: 01/02/2019 01:12 PM   Modules accepted: Orders

## 2019-01-03 ENCOUNTER — Telehealth: Payer: Self-pay

## 2019-01-03 DIAGNOSIS — E559 Vitamin D deficiency, unspecified: Secondary | ICD-10-CM

## 2019-01-03 DIAGNOSIS — R739 Hyperglycemia, unspecified: Secondary | ICD-10-CM

## 2019-01-03 NOTE — Telephone Encounter (Signed)
Pt. Requesting additional lab work - Hemoglobin A1c and lipid panel. Would like to have his Vit. D level rechecked in 3 months if possible. Please  Advise pt.

## 2019-01-03 NOTE — Telephone Encounter (Signed)
Please advise 

## 2019-01-06 NOTE — Telephone Encounter (Signed)
Okay. Arrange lab appointment in 3 months: A1c, FLP: Hyperglycemia Vitamin D, low vitamin D

## 2019-01-06 NOTE — Telephone Encounter (Signed)
Labs ordered. Needs fasting lab appt in 3 months please.

## 2019-01-06 NOTE — Addendum Note (Signed)
Addended byDamita Dunnings D on: 01/06/2019 09:59 AM   Modules accepted: Orders

## 2019-01-07 NOTE — Telephone Encounter (Signed)
Called patient left msg to call back for labs

## 2019-01-16 ENCOUNTER — Encounter: Payer: Self-pay | Admitting: Vascular Surgery

## 2019-01-16 ENCOUNTER — Ambulatory Visit: Payer: BC Managed Care – PPO | Admitting: Vascular Surgery

## 2019-01-16 ENCOUNTER — Other Ambulatory Visit: Payer: Self-pay

## 2019-01-16 VITALS — BP 131/81 | HR 76 | Temp 98.0°F | Resp 20 | Ht 70.0 in | Wt 198.8 lb

## 2019-01-16 DIAGNOSIS — I82401 Acute embolism and thrombosis of unspecified deep veins of right lower extremity: Secondary | ICD-10-CM | POA: Diagnosis not present

## 2019-01-16 NOTE — Progress Notes (Signed)
Referring Physician: Dr. Mardelle Matte  Patient name: Gregory Klein MRN: [id number] DOB: 07-04-1965 Sex: male  REASON FOR CONSULT: Right leg DVT  HPI: Gregory Klein is a 53 y.o. male, who recently had an arthroscopy and repair of the meniscus right leg by Dr. Mardelle Matte.  Post procedure he developed a right leg DVT.  He was started on Xarelto in August for this.  He has not had any bleeding complications.  He has not had shortness of breath or hemoptysis.  He has no family history of hypercoagulable state.  He has not had a prior episode of DVT.  Other medical problems include hypertension sleep apnea degenerative joint disease.  All of these have been stable.  Swelling in his right leg has essentially resolved.  He had a repeat duplex scan December 26, 2018 which showed resolution of about 90% of the thrombus burden but still a small amount of the tibioperoneal trunk portion of the popliteal vein.  Past Medical History:  Diagnosis Date  . Achilles tendonitis, bilateral 2016   [name], DPM  . Acute medial meniscus tear of right knee   . Acute myocarditis 2015   viral vs d/t strep infection, resolved w/o sequela  . Allergy   . 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 GERD (gastroesophageal reflux disease)   . Hemorrhoids    hx of  . Hepatitis B infection    hx of dx 2005 aprox  . Hyperglycemia 02/22/2015  . Hypertension   . Neck pain    local injections x 2, las summer 2011 (Dr.Vortek)  . Plantar fasciitis, bilateral 2016   [name], DPM  . Sleep apnea    on CPAP    Past Surgical History:  Procedure Laterality Date  . INGUINAL HERNIA REPAIR    . KNEE ARTHROSCOPY  6-15   Dr Mardelle Matte  . KNEE ARTHROSCOPY WITH MEDIAL MENISECTOMY Right 09/26/2018  . LEFT HEART CATHETERIZATION WITH CORONARY ANGIOGRAM N/A 12/29/2013   Procedure: LEFT HEART CATHETERIZATION WITH CORONARY ANGIOGRAM;  Surgeon:  [name], MD;  Location: [facility] CATH LAB;  Service: Cardiovascular;  Laterality: N/A;    Family History  Problem Relation Age of Onset  . Hypertension Mother   . Stroke Mother        M, Lillia Corporal  . Hypertension Father   . Coronary artery disease Maternal Grandmother   . Hypertension Maternal Grandmother   . Diabetes Paternal Grandfather   . Prostate cancer Maternal Grandfather 61  . Breast cancer Paternal Grandmother   . Pancreatic cancer Paternal Aunt   . Colon cancer Neg Hx   . Esophageal cancer Neg Hx   . Kidney disease Neg Hx   . Liver cancer Neg Hx   . Rectal cancer Neg Hx   . Stomach cancer Neg Hx     SOCIAL HISTORY: Social History   Socioeconomic History  . Marital status: Single    Spouse name: Not on file  . Number of children: 0  . Years of education: Not on file  . Highest education level: Not on file  Occupational History  . Occupation: Pension scheme manager: [employer]  Social Needs  . Financial resource strain: Not on file  . Food insecurity    Worry: Not on file    Inability: Not on file  . Transportation needs    Medical: Not on file  Non-medical: Not on file  Tobacco Use  . Smoking status: Never Smoker  . Smokeless tobacco: Never Used  Substance and Sexual Activity  . Alcohol use: No    Alcohol/week: 0.0 standard drinks  . Drug use: No  . Sexual activity: Not on file  Lifestyle  . Physical activity    Days per week: Not on file    Minutes per session: Not on file  . Stress: Not on file  Relationships  . Social Herbalist on phone: Not on file    Gets together: Not on file    Attends religious service: Not on file    Active member of club or organization: Not on file    Attends meetings of clubs or organizations: Not on file    Relationship status: Not on file  . Intimate partner violence    Fear of current or ex partner: Not on file    Emotionally abused: Not on file    Physically abused: Not on file     Forced sexual activity: Not on file  Other Topics Concern  . Not on file  Social History Narrative    Lives by himself    Allergies  Allergen Reactions  . Amoxicillin Rash    Current Outpatient Medications  Medication Sig Dispense Refill  . cetirizine (ZYRTEC) 10 MG chewable tablet Chew 10 mg by mouth daily.    . cyclobenzaprine (FLEXERIL) 5 MG tablet Take 1 tablet (5 mg total) by mouth at bedtime. 7 tablet 0  . hydrocortisone (ANUSOL-HC) 25 MG suppository Place 1 suppository (25 mg total) rectally 2 (two) times daily as needed for hemorrhoids. 12 suppository 1  . losartan (COZAAR) 50 MG tablet Take 1 tablet (50 mg total) by mouth daily. 90 tablet 1  . OVER THE COUNTER MEDICATION Place 1-2 drops into both eyes as needed (for dry eyes). "[facility] for Dry Eyes"    . polyethylene glycol (MIRALAX / GLYCOLAX) packet Take 17 g by mouth daily.    . Probiotic Product (PROBIOTIC DAILY PO) Take by mouth.    . psyllium (METAMUCIL) 58.6 % powder Take 1 packet by mouth as needed (for constipation). Reported on 05/07/2015    . rivaroxaban (XARELTO) 20 MG TABS tablet Take 1 tablet (20 mg total) by mouth daily with supper. 30 tablet 0  . valACYclovir (VALTREX) 1000 MG tablet TAKE 2 TABLETS BY MOUTH TWICE DAILY AS NEEDED FOR OUTBREAKS 30 tablet 7  . Vitamin D, Ergocalciferol, (DRISDOL) 1.25 MG (50000 UT) CAPS capsule Take 1 capsule (50,000 Units total) by mouth every 7 (seven) days. 12 capsule 0   No current facility-administered medications for this visit.     ROS:   General:  No weight loss, Fever, chills  HEENT: No recent headaches, no nasal bleeding, no visual changes, no sore throat  Neurologic: No dizziness, blackouts, seizures. No recent symptoms of stroke or mini- stroke. No recent episodes of slurred speech, or temporary blindness.  Cardiac: No recent episodes of chest pain/pressure, no shortness of breath at rest.  No shortness of breath with exertion.  Denies history of atrial  fibrillation or irregular heartbeat  Vascular: No history of rest pain in feet.  No history of claudication.  No history of non-healing ulcer, No history of DVT   Pulmonary: No home oxygen, no productive cough, no hemoptysis,  No asthma or wheezing  Musculoskeletal:  [ ]  Arthritis, [ ]  Low back pain,  [X]  Joint pain  Hematologic:No history of  hypercoagulable state.  No history of easy bleeding.  No history of anemia  Gastrointestinal: No hematochezia or melena,  No gastroesophageal reflux, no trouble swallowing  Urinary: [ ]  chronic Kidney disease, [ ]  on HD - [ ]  MWF or [ ]  TTHS, [ ]  Burning with urination, [ ]  Frequent urination, [ ]  Difficulty urinating;   Skin: No rashes  Psychological: No history of anxiety,  No history of depression   Physical Examination  Vitals:   01/16/19 1236  BP: 131/81  Pulse: 76  Resp: 20  Temp: 98 F (36.7 C)  SpO2: 99%  Weight: 198 lb 12.8 oz (90.2 kg)  Height: 5\' 10"  (1.778 m)    Body mass index is 28.52 kg/m.  General:  Alert and oriented, no acute distress HEENT: Normal Neck: No JVD Cardiac: Regular Rate and Rhythm  Skin: No rash Extremity Pulses:  2+ radial, brachial, femoral, dorsalis pedis, posterior tibial pulses bilaterally Musculoskeletal: No deformity or edema  Neurologic: Upper and lower extremity motor 5/5 and symmetric  DATA:  I reviewed the patient's venous duplex exam from December 26, 2018.  This shows some residual thrombus at the very distal portion of the popliteal vein.  This has improved significantly from his scan of August 2020.  ASSESSMENT: Uncomplicated DVT right leg with known inciting event of orthopedic procedure.  Patient has not had any evidence of pulmonary embolus.  Most of the thrombus has completely resolved at this point.  Usual course of anticoagulation is for 3 months and usually not repeat imaging.  I believe it would be safest to stop his anticoagulation at this point with very minimal risk.  I  discussed with the patient whether or not he wished to continue taking this for a total of 6 months for peace of mind and he would like to stop his anticoagulation at this point and I believe this is reasonable.   PLAN: Patient will stop his Xarelto after his current filled prescription.  He will follow up with me on as-needed basis.  He knows signs and symptoms of DVT or swelling shortness of breath chest pain hemoptysis and would certainly call us or his PCP if he had any of these symptoms.  If he develops recurrent DVT hypercoagulable work-up might be in order.  Ruta Hinds, MD [facility] Office: [phone]

## 2019-01-31 ENCOUNTER — Other Ambulatory Visit: Payer: Self-pay

## 2019-01-31 DIAGNOSIS — Z20822 Contact with and (suspected) exposure to covid-19: Secondary | ICD-10-CM

## 2019-02-01 LAB — NOVEL CORONAVIRUS, NAA: SARS-CoV-2, NAA: NOT DETECTED

## 2019-02-06 ENCOUNTER — Other Ambulatory Visit: Payer: Self-pay

## 2019-02-06 ENCOUNTER — Other Ambulatory Visit: Payer: BC Managed Care – PPO

## 2019-02-06 ENCOUNTER — Telehealth: Payer: Self-pay

## 2019-02-06 NOTE — Addendum Note (Signed)
Addended by: Caffie Pinto on: 02/06/2019 10:46 AM   Modules accepted: Orders

## 2019-02-06 NOTE — Telephone Encounter (Signed)
Pt here today for labs- wanted to discuss ongoing hemorrhoid and stool change issues- we discussed going back to Dr. Fuller Plan or seeing Dr. [name] again- agreed to schedule appt w/ PCP tomorrow- 02/07/2019.

## 2019-02-07 ENCOUNTER — Encounter: Payer: Self-pay | Admitting: Internal Medicine

## 2019-02-07 ENCOUNTER — Ambulatory Visit: Payer: BC Managed Care – PPO | Admitting: Internal Medicine

## 2019-02-07 VITALS — BP 128/81 | HR 80 | Temp 97.1°F | Resp 18 | Ht 70.0 in | Wt 194.0 lb

## 2019-02-07 DIAGNOSIS — K5909 Other constipation: Secondary | ICD-10-CM | POA: Diagnosis not present

## 2019-02-07 NOTE — Patient Instructions (Signed)
Metamucil 1 tablespoon with plenty of water throughout the morning  MiraLAX 17 g with plenty of water in the evening  Continue probiotics  Continue high-fiber diet  Avoid calcium supplements, vitamin D is okay  You may consider use Colace, stool softening.  Call if no better

## 2019-02-07 NOTE — Progress Notes (Signed)
Subjective:    Patient ID: Gregory Klein, male    DOB: [birth year], 53 y.o.   MRN: [id number]  DOS:  02/07/2019 Type of visit - description: Acute For the last few weeks, he has noted constipation described as very hard and small stools. Bleeding has not been an issue here lately. He takes Metamucil and MiraLAX every morning, admits that sometimes does not drink much water Diet is healthy, takes probiotics.   Review of Systems Denies nausea, vomiting or alternating diarrhea No abdominal pain Admits to some stress  Past Medical History:  Diagnosis Date  . Achilles tendonitis, bilateral 2016   [name], DPM  . Acute medial meniscus tear of right knee   . Acute myocarditis 2015   viral vs d/t strep infection, resolved w/o sequela  . Allergy   . 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 GERD (gastroesophageal reflux disease)   . Hemorrhoids    hx of  . Hepatitis B infection    hx of dx 2005 aprox  . Hyperglycemia 02/22/2015  . Hypertension   . Neck pain    local injections x 2, las summer 2011 (Dr.Vortek)  . Plantar fasciitis, bilateral 2016   [name], DPM  . Sleep apnea    on CPAP     Past Surgical History:  Procedure Laterality Date  . INGUINAL HERNIA REPAIR    . KNEE ARTHROSCOPY  6-15   Dr Mardelle Matte  . KNEE ARTHROSCOPY WITH MEDIAL MENISECTOMY Right 09/26/2018  . LEFT HEART CATHETERIZATION WITH CORONARY ANGIOGRAM N/A 12/29/2013   Procedure: LEFT HEART CATHETERIZATION WITH CORONARY ANGIOGRAM;  Surgeon: [name], MD;  Location: [facility] CATH LAB;  Service: Cardiovascular;  Laterality: N/A;    Social History   Socioeconomic History  . Marital status: Single    Spouse name: Not on file  . Number of children: 0  . Years of education: Not on file  . Highest education level: Not on file  Occupational History  . Occupation: Pension scheme manager: [employer]  Tobacco Use  . Smoking status: Never Smoker  . Smokeless tobacco: Never Used  Substance and Sexual Activity  . Alcohol use: No    Alcohol/week: 0.0 standard drinks  . Drug use: No  . Sexual activity: Not on file  Other Topics Concern  . Not on file  Social History Narrative    Lives by himself   Social Determinants of Health   Financial Resource Strain:   . Difficulty of Paying Living Expenses: Not on file  Food Insecurity:   . Worried About Charity fundraiser in the Last Year: Not on file  . Ran Out of Food in the Last Year: Not on file  Transportation Needs:   . Lack of Transportation (Medical): Not on file  . Lack of Transportation (Non-Medical): Not on file  Physical Activity:   . Days of Exercise per Week: Not on file  . Minutes of Exercise per Session: Not on file  Stress:   . Feeling of Stress : Not on file  Social Connections:   . Frequency of Communication with Friends and Family: Not on file  . Frequency of Social Gatherings with Friends and Family: Not on file  . Attends Religious Services: Not on file  . Active Member of Clubs or Organizations: Not on file  . Attends Archivist Meetings: Not  on file  . Marital Status: Not on file  Intimate Partner Violence:   . Fear of Current or Ex-Partner: Not on file  . Emotionally Abused: Not on file  . Physically Abused: Not on file  . Sexually Abused: Not on file      Allergies as of 02/07/2019      Reactions   Amoxicillin Rash      Medication List       Accurate as of February 07, 2019  3:55 PM. If you have any questions, ask your nurse or doctor.        cetirizine 10 MG chewable tablet Commonly known as: ZYRTEC Chew 10 mg by mouth daily.   cyclobenzaprine 5 MG tablet Commonly known as: FLEXERIL Take 1 tablet (5 mg total) by mouth at bedtime.   hydrocortisone 25 MG suppository Commonly known as: ANUSOL-HC Place 1 suppository (25 mg total) rectally 2 (two) times daily as needed  for hemorrhoids.   losartan 50 MG tablet Commonly known as: COZAAR Take 1 tablet (50 mg total) by mouth daily.   OVER THE COUNTER MEDICATION Place 1-2 drops into both eyes as needed (for dry eyes). "[employer] for Dry Eyes"   polyethylene glycol 17 g packet Commonly known as: MIRALAX / GLYCOLAX Take 17 g by mouth daily.   PROBIOTIC DAILY PO Take by mouth.   psyllium 58.6 % powder Commonly known as: METAMUCIL Take 1 packet by mouth as needed (for constipation). Reported on 05/07/2015   valACYclovir 1000 MG tablet Commonly known as: VALTREX TAKE 2 TABLETS BY MOUTH TWICE DAILY AS NEEDED FOR OUTBREAKS   Vitamin D (Ergocalciferol) 1.25 MG (50000 UT) Caps capsule Commonly known as: DRISDOL Take 1 capsule (50,000 Units total) by mouth every 7 (seven) days.           Objective:   Physical Exam BP 128/81 (BP Location: Left Arm, Patient Position: Sitting, Cuff Size: Normal)   Pulse 80   Temp (!) 97.1 F (36.2 C) (Temporal)   Resp 18   Ht 5\' 10"  (1.778 m)   Wt 194 lb (88 kg)   SpO2 100%   BMI 27.84 kg/m  General:   Well developed, NAD, BMI noted. HEENT:  Normocephalic . Face symmetric, atraumatic Abdomen: Soft, not tender Skin: Not pale. Not jaundice Neurologic:  alert & oriented X3.  Speech normal, gait appropriate for age and unassisted Psych--  Cognition and judgment appear intact.  Cooperative with normal attention span and concentration.  Behavior appropriate. No anxious or depressed appearing.      Assessment      Assessment  Prediabetes A1C 5.9 (12-2014) HTN OSA on CPAP DVT: 09/2018: R leg DVT 3 weeks after R knee arthoscopy H/o cold sores , on valtrex  H/o Acute myocarditis --- 2015 , had a cath, problem related to strep, no sequela H/o Suspected cirrhosis, disproved by a negative biopsy 01/2014 H/o hep B infex 2015 H/o Neck pain, s/p 2 injections 2011 (Dr. Adline Peals), occ numbness R thumb, no neck pain as of 05-2015 Urolithiasis ER  04-30-25  PLAN Constipation: He is taking the right medicines but we are going to change the timing and increase fluid intake. See AVS Metamucil in the morning, MiraLAX in the evening, continue probiotics and high-fiber diet. Use Colace if needed. If above is not working, he will call me back, consider GI referral, constipation predominant IBS?  Further medications? DVT: Seen by vascular surgery 01/16/2019, at the time they decided to stop Xarelto after 3 months.  No need for further imaging.   This visit occurred during the SARS-CoV-2 public health emergency.  Safety protocols were in place, including screening questions prior to the visit, additional usage of staff PPE, and extensive cleaning of exam room while observing appropriate contact time as indicated for disinfecting solutions.

## 2019-02-07 NOTE — Progress Notes (Signed)
Pre visit review using our clinic review tool, if applicable. No additional management support is needed unless otherwise documented below in the visit note. 

## 2019-02-09 NOTE — Assessment & Plan Note (Signed)
Constipation: He is taking the right medicines but we are going to change the timing and increase fluid intake. See AVS Metamucil in the morning, MiraLAX in the evening, continue probiotics and high-fiber diet. Use Colace if needed. If above is not working, he will call me back, consider GI referral, constipation predominant IBS?  Further medications? DVT: Seen by vascular surgery 01/16/2019, at the time they decided to stop Xarelto after 3 months.  No need for further imaging.

## 2019-02-10 ENCOUNTER — Other Ambulatory Visit: Payer: Self-pay | Admitting: Orthopedic Surgery

## 2019-02-10 DIAGNOSIS — M542 Cervicalgia: Secondary | ICD-10-CM

## 2019-02-25 ENCOUNTER — Other Ambulatory Visit: Payer: Self-pay

## 2019-02-25 ENCOUNTER — Ambulatory Visit
Admission: RE | Admit: 2019-02-25 | Discharge: 2019-02-25 | Disposition: A | Discharge status: Home / Self Care | Payer: BC Managed Care – PPO | Source: Ambulatory Visit | Attending: Orthopedic Surgery | Admitting: Orthopedic Surgery

## 2019-02-25 DIAGNOSIS — M542 Cervicalgia: Secondary | ICD-10-CM

## 2019-02-25 MED ORDER — IOPAMIDOL (ISOVUE-M 300) INJECTION 61%
1.0000 mL | Freq: Once | INTRAMUSCULAR | Status: AC | PRN
  Administered 2019-02-25: 11:00:00 1 mL via EPIDURAL

## 2019-02-25 MED ORDER — TRIAMCINOLONE ACETONIDE 40 MG/ML IJ SUSP (RADIOLOGY)
60.0000 mg | Freq: Once | INTRAMUSCULAR | Status: AC
  Administered 2019-02-25: 60 mg via EPIDURAL

## 2019-02-25 NOTE — Discharge Instructions (Signed)

## 2019-02-28 ENCOUNTER — Other Ambulatory Visit: Payer: Self-pay

## 2019-02-28 ENCOUNTER — Encounter (HOSPITAL_COMMUNITY): Payer: Self-pay | Admitting: Emergency Medicine

## 2019-02-28 ENCOUNTER — Emergency Department (HOSPITAL_COMMUNITY)
Admission: EM | Admit: 2019-02-28 | Discharge: 2019-02-28 | Disposition: A | Discharge status: Home / Self Care | Payer: BC Managed Care – PPO | Attending: Emergency Medicine | Admitting: Emergency Medicine

## 2019-02-28 DIAGNOSIS — I10 Essential (primary) hypertension: Secondary | ICD-10-CM | POA: Insufficient documentation

## 2019-02-28 DIAGNOSIS — Z79899 Other long term (current) drug therapy: Secondary | ICD-10-CM | POA: Insufficient documentation

## 2019-02-28 DIAGNOSIS — M545 Low back pain, unspecified: Secondary | ICD-10-CM

## 2019-02-28 LAB — COMPREHENSIVE METABOLIC PANEL
ALT: 35 U/L (ref 0–44)
AST: 19 U/L (ref 15–41)
Albumin: 3.9 g/dL (ref 3.5–5.0)
Alkaline Phosphatase: 59 U/L (ref 38–126)
Anion gap: 8 (ref 5–15)
BUN: 13 mg/dL (ref 6–20)
CO2: 28 mmol/L (ref 22–32)
Calcium: 8.8 mg/dL — ABNORMAL LOW (ref 8.9–10.3)
Chloride: 101 mmol/L (ref 98–111)
Creatinine, Ser: 1.21 mg/dL (ref 0.61–1.24)
GFR calc Af Amer: >60 mL/min (ref >60)
GFR calc non Af Amer: >60 mL/min (ref >60)
Glucose, Bld: 94 mg/dL (ref 70–99)
Potassium: 4 mmol/L (ref 3.5–5.1)
Sodium: 137 mmol/L (ref 135–145)
Total Bilirubin: 1 mg/dL (ref 0.3–1.2)
Total Protein: 7.1 g/dL (ref 6.5–8.1)

## 2019-02-28 LAB — CBC WITH DIFFERENTIAL/PLATELET
Abs Immature Granulocytes: 0.03 10*3/uL (ref 0.00–0.07)
Basophils Absolute: 0 10*3/uL (ref 0.0–0.1)
Basophils Relative: 0 %
Eosinophils Absolute: 0 10*3/uL (ref 0.0–0.5)
Eosinophils Relative: 0 %
HCT: 43.5 % (ref 39.0–52.0)
Hemoglobin: 14 g/dL (ref 13.0–17.0)
Immature Granulocytes: 0 %
Lymphocytes Relative: 19 %
Lymphs Abs: 1.6 10*3/uL (ref 0.7–4.0)
MCH: 27.1 pg (ref 26.0–34.0)
MCHC: 32.2 g/dL (ref 30.0–36.0)
MCV: 84.1 fL (ref 80.0–100.0)
Monocytes Absolute: 0.8 10*3/uL (ref 0.1–1.0)
Monocytes Relative: 9 %
Neutro Abs: 5.9 10*3/uL (ref 1.7–7.7)
Neutrophils Relative %: 72 %
Platelets: 272 10*3/uL (ref 150–400)
RBC: 5.17 MIL/uL (ref 4.22–5.81)
RDW: 14.6 % (ref 11.5–15.5)
WBC: 8.4 10*3/uL (ref 4.0–10.5)
nRBC: 0 % (ref 0.0–0.2)

## 2019-02-28 LAB — URINALYSIS, ROUTINE W REFLEX MICROSCOPIC
Bilirubin Urine: NEGATIVE
Glucose, UA: NEGATIVE mg/dL
Hgb urine dipstick: NEGATIVE
Ketones, ur: NEGATIVE mg/dL
Leukocytes,Ua: NEGATIVE
Nitrite: NEGATIVE
Protein, ur: NEGATIVE mg/dL
Specific Gravity, Urine: 1.002 — ABNORMAL LOW (ref 1.005–1.030)
pH: 7 (ref 5.0–8.0)

## 2019-02-28 LAB — LIPASE, BLOOD: Lipase: 50 U/L (ref 11–51)

## 2019-02-28 MED ORDER — NAPROXEN 500 MG PO TABS: 500.0000 mg | ORAL_TABLET | Freq: Two times a day (BID) | ORAL | 0 refills | Status: DC

## 2019-02-28 MED ORDER — METHOCARBAMOL 500 MG PO TABS: 500.0000 mg | ORAL_TABLET | Freq: Two times a day (BID) | ORAL | 0 refills | Status: DC

## 2019-02-28 MED ORDER — LIDOCAINE 5 % EX PTCH: 1.0000 | MEDICATED_PATCH | CUTANEOUS | 0 refills | Status: DC

## 2019-02-28 MED ORDER — LIDOCAINE 5 % EX PTCH
1.0000 | MEDICATED_PATCH | Freq: Once | CUTANEOUS | Status: DC
  Administered 2019-02-28: 1 via TRANSDERMAL
  Filled 2019-02-28: qty 1

## 2019-02-28 NOTE — ED Triage Notes (Signed)
Patient c/o lower back pain wrapping around lower abdomen x 3 days. Reports hx of kidney stone, but denies any urinary symptoms.

## 2019-02-28 NOTE — ED Provider Notes (Signed)
[facility] DEPART[facility] Provider Note   CSN: [id number] Arrival date & time: 02/28/19  1603     History Chief Complaint  Patient presents with  . Back Pain    Gregory Klein is a 54 y.o. male with PMHx HTN, OSA on CPAP, GERD, who presents to the ED today complaining of gradual onset, intermittent, achy, bilateral lower back pain radiating around to abdomen bilaterally x 3 days.  Patient reports that he had a kidney stone a few months ago and this feels similar and he wanted to make sure he does not have another kidney stone.  He is not having any urinary symptoms currently.  He denies urinary frequency, urgency, hematuria.  No nausea or vomiting or diarrhea.  Patient states that he has taken Tylenol and Ibuprofen with mild relief although states that the back pain is always present.  Reports the pain is worsened if he straightens his back out.    Patient does mention that he had a epidural injection to his cervical spine a day before his back pain started and is unsure if this is related as well.  He is denying any neck pain.  No radiation to his upper or lower extremities.  No fevers or chills. No saddle esthesia, urinary retention, urinary or bowel incontinence, headache, vision changes, weakness or numbness, any other associated symptoms.  The history is provided by the patient and medical records.       Past Medical History:  Diagnosis Date  . Achilles tendonitis, bilateral 2016   [name], DPM  . Acute medial meniscus tear of right knee   . Acute myocarditis 2015   viral vs d/t strep infection, resolved w/o sequela  . Allergy   . 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 GERD (gastroesophageal reflux disease)   . Hemorrhoids    hx of  . Hepatitis B infection    hx of dx 2005 aprox  . Hyperglycemia 02/22/2015  . Hypertension   . Neck pain    local  injections x 2, las summer 2011 (Dr.Vortek)  . Plantar fasciitis, bilateral 2016   [name], DPM  . Sleep apnea    on CPAP     Patient Active Problem List   Diagnosis Date Noted  . Hyperglycemia 02/22/2015  . PCP NOTES >>> 12/28/2014  . OSA on CPAP 07/21/2014  . Myocarditis ([place])   . Hypertension   . GERD (gastroesophageal reflux disease)   . Hepatitis B infection   . Neck pain--Chronic 11/10/2013  . Annual physical exam 03/29/2011    Past Surgical History:  Procedure Laterality Date  . INGUINAL HERNIA REPAIR    . KNEE ARTHROSCOPY  6-15   Dr Mardelle Matte  . KNEE ARTHROSCOPY WITH MEDIAL MENISECTOMY Right 09/26/2018  . LEFT HEART CATHETERIZATION WITH CORONARY ANGIOGRAM N/A 12/29/2013   Procedure: LEFT HEART CATHETERIZATION WITH CORONARY ANGIOGRAM;  Surgeon: [name], MD;  Location: [facility] CATH LAB;  Service: Cardiovascular;  Laterality: N/A;       Family History  Problem Relation Age of Onset  . Hypertension Mother   . Stroke Mother        M, Lillia Corporal  . Hypertension Father   . Coronary artery disease Maternal Grandmother   . Hypertension Maternal Grandmother   . Diabetes Paternal Grandfather   . Prostate cancer Maternal Grandfather 88  . Breast cancer Paternal Grandmother   .  Pancreatic cancer Paternal Aunt   . Colon cancer Neg Hx   . Esophageal cancer Neg Hx   . Kidney disease Neg Hx   . Liver cancer Neg Hx   . Rectal cancer Neg Hx   . Stomach cancer Neg Hx     Social History   Tobacco Use  . Smoking status: Never Smoker  . Smokeless tobacco: Never Used  Substance Use Topics  . Alcohol use: No    Alcohol/week: 0.0 standard drinks  . Drug use: No    Home Medications Prior to Admission medications   Medication Sig Start Date End Date Taking? Authorizing Provider  cetirizine (ZYRTEC) 10 MG chewable tablet Chew 10 mg by mouth daily.    [provider]  cyclobenzaprine (FLEXERIL) 5 MG tablet Take 1 tablet (5 mg total) by mouth at bedtime. Patient not  taking: Reported on 02/07/2019 04/05/18   Saguier, Percell Miller, PA-C  hydrocortisone (ANUSOL-HC) 25 MG suppository Place 1 suppository (25 mg total) rectally 2 (two) times daily as needed for hemorrhoids. 12/31/18   Colon Branch, MD  lidocaine (LIDODERM) 5 % Place 1 patch onto the skin daily. Remove & Discard patch within 12 hours or as directed by MD 02/28/19   Eustaquio Maize, PA-C  losartan (COZAAR) 50 MG tablet Take 1 tablet (50 mg total) by mouth daily. 11/11/18   Colon Branch, MD  methocarbamol (ROBAXIN) 500 MG tablet Take 1 tablet (500 mg total) by mouth 2 (two) times daily. 02/28/19   Alroy Bailiff, Laszlo Ellerby, PA-C  naproxen (NAPROSYN) 500 MG tablet Take 1 tablet (500 mg total) by mouth 2 (two) times daily. 02/28/19   Karo Rog, PA-C  OVER THE COUNTER MEDICATION Place 1-2 drops into both eyes as needed (for dry eyes). "[employer] for Dry Eyes"    [provider]  polyethylene glycol (MIRALAX / GLYCOLAX) packet Take 17 g by mouth daily.    [provider]  Probiotic Product (PROBIOTIC DAILY PO) Take by mouth.    [provider]  psyllium (METAMUCIL) 58.6 % powder Take 1 packet by mouth as needed (for constipation). Reported on 05/07/2015    [provider]  valACYclovir (VALTREX) 1000 MG tablet TAKE 2 TABLETS BY MOUTH TWICE DAILY AS NEEDED FOR OUTBREAKS 10/25/17   Colon Branch, MD  Vitamin D, Ergocalciferol, (DRISDOL) 1.25 MG (50000 UT) CAPS capsule Take 1 capsule (50,000 Units total) by mouth every 7 (seven) days. 01/02/19   Colon Branch, MD    Allergies    Amoxicillin  Review of Systems   Review of Systems  Constitutional: Negative for chills and fever.  HENT: Negative for congestion.   Eyes: Negative for visual disturbance.  Respiratory: Negative for cough and shortness of breath.   Cardiovascular: Negative for chest pain.  Gastrointestinal: Positive for abdominal pain. Negative for constipation, diarrhea, rectal pain and vomiting.  Genitourinary: Negative for  difficulty urinating, dysuria, flank pain, frequency, hematuria and testicular pain.  Musculoskeletal: Positive for back pain.  Neurological: Negative for dizziness, weakness, light-headedness, numbness and headaches.    Physical Exam Updated Vital Signs BP (!) 145/81 (BP Location: Right Arm)   Pulse 82   Temp 98.4 F (36.9 C) (Oral)   Resp 18   SpO2 100%   Physical Exam Vitals and nursing note reviewed.  Constitutional:      Appearance: He is not ill-appearing or diaphoretic.  HENT:     Head: Normocephalic and atraumatic.  Eyes:     Conjunctiva/sclera: Conjunctivae normal.  Neck:     Comments: No overlying skin changes including erythema, edema, or ecchymosis to injection site. No tenderness.  Cardiovascular:     Rate and Rhythm: Normal rate and regular rhythm.     Pulses: Normal pulses.  Pulmonary:     Effort: Pulmonary effort is normal.     Breath sounds: Normal breath sounds. No wheezing, rhonchi or rales.  Abdominal:     Palpations: Abdomen is soft.     Tenderness: There is no abdominal tenderness. There is no right CVA tenderness, left CVA tenderness, guarding or rebound.     Comments: Soft, no tenderness with palpation diffusely to abdomen, +BS throughout, no r/g/r, neg murphy's, neg mcburney's, no CVA TTP  Musculoskeletal:     Cervical back: Neck supple.       Back:     Comments: No C, T, or L midline spinal tenderness. ROM intact throughout neck and back. + tenderness to bilateral paraspinal musculature to lumbar area. No tenderness into hips or down BLEs. Sensation intact throughout BUE and BLEs. Strength 5/5 to all extremities. 2+ radial and DP pulses. Negative SLR bilaterally.   Skin:    General: Skin is warm and dry.  Neurological:     Mental Status: He is alert.     ED Results / Procedures / Treatments   Labs (all labs ordered are listed, but only abnormal results are displayed) Labs Reviewed  URINALYSIS, ROUTINE W REFLEX MICROSCOPIC - Abnormal; Notable  for the following components:      Result Value   Color, Urine COLORLESS (*)    Specific Gravity, Urine 1.002 (*)    All other components within normal limits  COMPREHENSIVE METABOLIC PANEL - Abnormal; Notable for the following components:   Calcium 8.8 (*)    All other components within normal limits  CBC WITH DIFFERENTIAL/PLATELET  LIPASE, BLOOD    EKG None  Radiology No results found.  Procedures Procedures (including critical care time)  Medications Ordered in ED Medications  lidocaine (LIDODERM) 5 % 1 patch (1 patch Transdermal Patch Applied 02/28/19 1850)    ED Course  I have reviewed the triage vital signs and the nursing notes.  Pertinent labs & imaging results that were available during my care of the patient were reviewed by me and considered in my medical decision making (see chart for details).  54 year old male presents to the ED today complaining of bilateral lower back pain with radiation to his abdomen for the past 3 days.  He does mention that he had a epidural injection to his C-spine a day before the pain started but is denying any neck pain.  No overlying skin changes to neck. No infectious symptoms today.  Patient's vitals are stable.  He is afebrile without tachycardia or tachypnea.  He has no red flag symptoms concerning for any kind of spinal epidural abscess today.  No focal neuro deficits on exam.  Moving all extremities without difficulty.  Personally visualized patient ambulating from the restroom without difficulty.  He has no specific CVA tenderness on exam.  His pain is mostly bilateral lumbar spinal pain without any midline spinal tenderness.  He does point to his left lower quadrant and right lower quadrant when he states the pain radiates to this area although he has no focal tenderness on exam to his abdomen today.  I very much doubt acute abdomen at this time.  Urinalysis has been collected and there are no signs of hemoglobin in the urine.  I have  collected screening labs including a CBC, CMP, lipase given patient is complaining of abdominal pain as well.  His symptoms do sound more musculoskeletal in nature.  He states that the pain is worsened when he tries to straighten out his spine and stretch.  I will give him a lido patch in the ED and reevaluate.  If no acute findings on lab work patient to be discharged home with muscle relaxers to see if this helps with his pain.  To follow-up with his PCP.  I had shared decision making with patient in regards to obtaining a CT renal stone study given there is no hemoglobin in his urine today and his symptoms do not sound consistent with kidney stones despite his history of one kidney stone in the past.  Patient is in agreement that he does not want a CT scan today or to accrue the additional cost.   Labwork reassuring today. No leukocytosis on CBC to suggest infection. Hgb is stable. CMP without electrolyte abnormalities. Creatinine 1.21 although this is appears to be near pt's baseline. No other acute findings on CMP. Lipase negative.   Lab Results  Component Value Date   CREATININE 1.21 02/28/2019   CREATININE 1.06 12/31/2018   CREATININE 1.16 10/19/2018   On reevaluation pt states mild improvement with lidocaine patch. I do not see any reason for additonal labwork or imaging at this time. Will discharge with lidoderm patches, robaxin, and naproxen and have pt follow up with PCP. He does mention a couple of days prior to his pain starting he lifted a heavy object which I suspect to be the cause. Strict return precautions have been discussed. Pt is in agreement with plan and stable for discharge home.   This note was prepared using Dragon voice recognition software and may include unintentional dictation errors due to the inherent limitations of voice recognition software.     MDM Rules/Calculators/A&P                      = Final Clinical Impression(s) / ED Diagnoses Final diagnoses:  Acute  bilateral low back pain without sciatica    Rx / DC Orders ED Discharge Orders         Ordered    methocarbamol (ROBAXIN) 500 MG tablet  2 times daily     02/28/19 1909    lidocaine (LIDODERM) 5 %  Every 24 hours     02/28/19 1910    naproxen (NAPROSYN) 500 MG tablet  2 times daily     02/28/19 1910           Discharge Instructions     Your labwork and urine looked reassuring today. Your symptoms sound more consistent with musculoskeletal pain than a kidney stone.   Please use muscle relaxers as prescribed. DO NOT DRIVE WHILE ON THIS MEDICATION AS IT CAN MAKE YOU DROWSY. You can use the antiinflammatories during the day. I have also prescribed a lidocaine patch to use as needed for pain (to be changed out every 24 hours).   Please follow up with your PCP regarding your ED visit today.   Return to the ED IMMEDIATELY for any worsening symptoms including worsening pain, pain going down your legs, numbness in your groin, inability to control your bladder or bowel function, urinary retention, numbness/weakness in your upper extremities, fevers > 100.4.        Eustaquio Maize, PA-C 02/28/19 1931    Tegeler, Gwenyth Allegra, MD 02/28/19 2218

## 2019-02-28 NOTE — Discharge Instructions (Addendum)
Your labwork and urine looked reassuring today. Your symptoms sound more consistent with musculoskeletal pain than a kidney stone.   Please use muscle relaxers as prescribed. DO NOT DRIVE WHILE ON THIS MEDICATION AS IT CAN MAKE YOU DROWSY. You can use the antiinflammatories during the day. I have also prescribed a lidocaine patch to use as needed for pain (to be changed out every 24 hours).   Please follow up with your PCP regarding your ED visit today.   Return to the ED IMMEDIATELY for any worsening symptoms including worsening pain, pain going down your legs, numbness in your groin, inability to control your bladder or bowel function, urinary retention, numbness/weakness in your upper extremities, fevers > 100.4.

## 2019-03-10 ENCOUNTER — Other Ambulatory Visit: Payer: Self-pay

## 2019-03-11 ENCOUNTER — Encounter: Payer: Self-pay | Admitting: Internal Medicine

## 2019-03-11 ENCOUNTER — Ambulatory Visit: Payer: BC Managed Care – PPO | Admitting: Internal Medicine

## 2019-03-11 ENCOUNTER — Other Ambulatory Visit: Payer: Self-pay

## 2019-03-11 VITALS — BP 115/70 | HR 73 | Temp 96.7°F | Resp 12 | Ht 70.0 in | Wt 195.2 lb

## 2019-03-11 DIAGNOSIS — K625 Hemorrhage of anus and rectum: Secondary | ICD-10-CM

## 2019-03-11 DIAGNOSIS — M549 Dorsalgia, unspecified: Secondary | ICD-10-CM

## 2019-03-11 NOTE — Patient Instructions (Signed)
We are referring you to physical therapy  Okay to take occasionally naproxen and a muscle relaxant  You have internal hemorrhoids, if you develop severe bleeding let me know

## 2019-03-11 NOTE — Progress Notes (Signed)
Subjective:    Patient ID: Gregory Klein, male    DOB: [birth year], 54 y.o.   MRN: [id number]  DOS:  03/11/2019 Type of visit - description: Acute He did some heavy lifting and developed bilateral back pain at around L1-L2 with some radiation to both sides. Went to the ER 02/28/2019.  UA CMP, CBC essentially normal, was prescribed methocarbamol and naproxen. Currently much better.  Also constipation (see last visit) is better however he has seen a couple of times red blood when he wipes after a bowel movement.    Review of Systems  Denies fever chills No nausea or vomiting No dysuria, gross hematuria difficulty urinating.  Past Medical History:  Diagnosis Date  . Achilles tendonitis, bilateral 2016   [name], DPM  . Acute medial meniscus tear of right knee   . Acute myocarditis 2015   viral vs d/t strep infection, resolved w/o sequela  . Allergy   . 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 GERD (gastroesophageal reflux disease)   . Hemorrhoids    hx of  . Hepatitis B infection    hx of dx 2005 aprox  . Hyperglycemia 02/22/2015  . Hypertension   . Neck pain    local injections x 2, las summer 2011 (Dr.Vortek)  . Plantar fasciitis, bilateral 2016   [name], DPM  . Sleep apnea    on CPAP     Past Surgical History:  Procedure Laterality Date  . INGUINAL HERNIA REPAIR    . KNEE ARTHROSCOPY  6-15   Dr Mardelle Matte  . KNEE ARTHROSCOPY WITH MEDIAL MENISECTOMY Right 09/26/2018  . LEFT HEART CATHETERIZATION WITH CORONARY ANGIOGRAM N/A 12/29/2013   Procedure: LEFT HEART CATHETERIZATION WITH CORONARY ANGIOGRAM;  Surgeon: [name], MD;  Location: [facility] CATH LAB;  Service: Cardiovascular;  Laterality: N/A;    Social History   Socioeconomic History  . Marital status: Single    Spouse name: Not on file  . Number of children: 0  . Years of education: Not on file  . Highest  education level: Not on file  Occupational History  . Occupation: Pension scheme manager: [employer]  Tobacco Use  . Smoking status: Never Smoker  . Smokeless tobacco: Never Used  Substance and Sexual Activity  . Alcohol use: No    Alcohol/week: 0.0 standard drinks  . Drug use: No  . Sexual activity: Not on file  Other Topics Concern  . Not on file  Social History Narrative    Lives by himself   Social Determinants of Health   Financial Resource Strain:   . Difficulty of Paying Living Expenses: Not on file  Food Insecurity:   . Worried About Charity fundraiser in the Last Year: Not on file  . Ran Out of Food in the Last Year: Not on file  Transportation Needs:   . Lack of Transportation (Medical): Not on file  . Lack of Transportation (Non-Medical): Not on file  Physical Activity:   . Days of Exercise per Week: Not on file  . Minutes of Exercise per Session: Not on file  Stress:   . Feeling of Stress : Not on file  Social Connections:   . Frequency of Communication with Friends and Family: Not on file  . Frequency of Social Gatherings with Friends and Family: Not on file  . Attends Religious Services: Not  on file  . Active Member of Clubs or Organizations: Not on file  . Attends Archivist Meetings: Not on file  . Marital Status: Not on file  Intimate Partner Violence:   . Fear of Current or Ex-Partner: Not on file  . Emotionally Abused: Not on file  . Physically Abused: Not on file  . Sexually Abused: Not on file      Allergies as of 03/11/2019      Reactions   Amoxicillin Rash      Medication List       Accurate as of March 11, 2019  9:31 AM. If you have any questions, ask your nurse or doctor.        STOP taking these medications   cyclobenzaprine 5 MG tablet Commonly known as: FLEXERIL Stopped by: Kathlene November, MD   hydrocortisone 25 MG suppository Commonly known as: ANUSOL-HC Stopped by: Kathlene November, MD   lidocaine 5 %  Commonly known as: Lidoderm Stopped by: Kathlene November, MD   methocarbamol 500 MG tablet Commonly known as: ROBAXIN Stopped by: Kathlene November, MD     TAKE these medications   cetirizine 10 MG chewable tablet Commonly known as: ZYRTEC Chew 10 mg by mouth daily.   losartan 50 MG tablet Commonly known as: COZAAR Take 1 tablet (50 mg total) by mouth daily.   naproxen 500 MG tablet Commonly known as: NAPROSYN Take 1 tablet (500 mg total) by mouth 2 (two) times daily.   OVER THE COUNTER MEDICATION Place 1-2 drops into both eyes as needed (for dry eyes). "[place] for Dry Eyes"   polyethylene glycol 17 g packet Commonly known as: MIRALAX / GLYCOLAX Take 17 g by mouth daily.   PROBIOTIC DAILY PO Take by mouth.   psyllium 58.6 % powder Commonly known as: METAMUCIL Take 1 packet by mouth as needed (for constipation). Reported on 05/07/2015   valACYclovir 1000 MG tablet Commonly known as: VALTREX TAKE 2 TABLETS BY MOUTH TWICE DAILY AS NEEDED FOR OUTBREAKS   Vitamin D (Ergocalciferol) 1.25 MG (50000 UNIT) Caps capsule Commonly known as: DRISDOL Take 1 capsule (50,000 Units total) by mouth every 7 (seven) days.           Objective:   Physical Exam BP 115/70 (BP Location: Right Arm, Cuff Size: Large)   Pulse 73   Temp (!) 96.7 F (35.9 C) (Temporal)   Resp 12   Ht 5\' 10"  (1.778 m)   Wt 195 lb 3.2 oz (88.5 kg)   SpO2 100%   BMI 28.01 kg/m  General:   Well developed, NAD, BMI noted. HEENT:  Normocephalic . Face symmetric, atraumatic MSK: No TTP at the lumbar spine. DRE: Sphincter tone slightly increased, otherwise DRE normal. Anoscopy: Limited but I saw a few small internal hemorrhoids. Skin: Not pale. Not jaundice Neurologic:  alert & oriented X3.  Speech normal, gait appropriate for age and unassisted. Straight leg test negative DTRs symmetric.  Motor symmetric. Psych--  Cognition and judgment appear intact.  Cooperative with normal attention span and  concentration.  Behavior appropriate. No anxious or depressed appearing.      Assessment       Assessment  Prediabetes A1C 5.9 (12-2014) HTN OSA on CPAP DVT: 09/2018: R leg DVT 3 weeks after R knee arthoscopy H/o cold sores , on valtrex  H/o Acute myocarditis --- 2015 , had a cath, problem related to strep, no sequela H/o Suspected cirrhosis, disproved by a negative biopsy 01/2014 H/o hep B  infex 2015 H/o Neck pain, s/p 2 injections 2011 (Dr. Adline Peals), occ numbness R thumb,  Injection 01/2019 Urolithiasis ER 04-30-25  PLAN Back pain: As described above, work-up at the ER negative, already improving.  Recommend to continue using occasional naproxen and a muscle relaxant and referred to physical therapy Red blood per rectum: Advised patient that is likely from hemorrhoids, he had a recent colonoscopy 07/2017 that did show hemorrhoids.  He insisted on further evaluation, I did a DRE and anoscopy that were confirmatory.  Recommend to avoid constipation.   This visit occurred during the SARS-CoV-2 public health emergency.  Safety protocols were in place, including screening questions prior to the visit, additional usage of staff PPE, and extensive cleaning of exam room while observing appropriate contact time as indicated for disinfecting solutions.

## 2019-03-12 NOTE — Assessment & Plan Note (Signed)
Back pain: As described above, work-up at the ER negative, already improving.  Recommend to continue using occasional naproxen and a muscle relaxant and referred to physical therapy Red blood per rectum: Advised patient that is likely from hemorrhoids, he had a recent colonoscopy 07/2017 that did show hemorrhoids.  He insisted on further evaluation, I did a DRE and anoscopy that were confirmatory.  Recommend to avoid constipation.

## 2019-03-24 ENCOUNTER — Telehealth: Payer: Self-pay | Admitting: Internal Medicine

## 2019-03-24 MED ORDER — VALACYCLOVIR HCL 1 G PO TABS: ORAL_TABLET | ORAL | 6 refills | Status: DC

## 2019-03-24 NOTE — Telephone Encounter (Signed)
Rx sent 

## 2019-03-24 NOTE — Telephone Encounter (Signed)
Medication: valACYclovir (VALTREX) 1000 MG tablet Patient is almost   Has the patient contacted their pharmacy? No. (If no, request that the patient contact the pharmacy for the refill.) (If yes, when and what did the pharmacy advise?)  Preferred Pharmacy (with phone number or street name): [facility] on [address] and [address]: Please be advised that RX refills may take up to 3 business days. We ask that you follow-up with your pharmacy.

## 2019-03-25 ENCOUNTER — Other Ambulatory Visit: Payer: Self-pay | Admitting: Internal Medicine

## 2019-04-23 ENCOUNTER — Other Ambulatory Visit: Payer: Self-pay

## 2019-04-23 ENCOUNTER — Other Ambulatory Visit (INDEPENDENT_AMBULATORY_CARE_PROVIDER_SITE_OTHER): Payer: BC Managed Care – PPO

## 2019-04-23 DIAGNOSIS — E559 Vitamin D deficiency, unspecified: Secondary | ICD-10-CM

## 2019-04-23 DIAGNOSIS — R739 Hyperglycemia, unspecified: Secondary | ICD-10-CM

## 2019-04-24 ENCOUNTER — Other Ambulatory Visit: Payer: BC Managed Care – PPO

## 2019-04-24 LAB — LIPID PANEL
Chol/HDL Ratio: 3.1 ratio (ref 0.0–5.0)
Cholesterol, Total: 175 mg/dL (ref 100–199)
HDL: 56 mg/dL (ref >39)
LDL Chol Calc (NIH): 112 mg/dL — ABNORMAL HIGH (ref 0–99)
Triglycerides: 31 mg/dL (ref 0–149)
VLDL Cholesterol Cal: 7 mg/dL (ref 5–40)

## 2019-04-24 LAB — HEMOGLOBIN A1C
Est. average glucose Bld gHb Est-mCnc: 117 mg/dL
Hgb A1c MFr Bld: 5.7 % — ABNORMAL HIGH (ref 4.8–5.6)

## 2019-04-24 LAB — VITAMIN D 25 HYDROXY (VIT D DEFICIENCY, FRACTURES): Vit D, 25-Hydroxy: 60.8 ng/mL (ref 30.0–100.0)

## 2019-04-28 ENCOUNTER — Other Ambulatory Visit: Payer: Self-pay | Admitting: Physical Medicine and Rehabilitation

## 2019-04-28 DIAGNOSIS — M5416 Radiculopathy, lumbar region: Secondary | ICD-10-CM

## 2019-05-14 ENCOUNTER — Ambulatory Visit
Admission: RE | Admit: 2019-05-14 | Discharge: 2019-05-14 | Disposition: A | Discharge status: Home / Self Care | Payer: BC Managed Care – PPO | Source: Ambulatory Visit | Attending: Physical Medicine and Rehabilitation | Admitting: Physical Medicine and Rehabilitation

## 2019-05-14 ENCOUNTER — Other Ambulatory Visit: Payer: Self-pay

## 2019-05-14 DIAGNOSIS — M5416 Radiculopathy, lumbar region: Secondary | ICD-10-CM

## 2019-05-14 MED ORDER — LOSARTAN POTASSIUM 50 MG PO TABS: 50.0000 mg | ORAL_TABLET | Freq: Every day | ORAL | 1 refills | Status: DC

## 2019-05-18 ENCOUNTER — Other Ambulatory Visit: Payer: Self-pay | Admitting: Internal Medicine

## 2019-06-15 ENCOUNTER — Other Ambulatory Visit: Payer: Self-pay | Admitting: Internal Medicine

## 2019-07-30 ENCOUNTER — Other Ambulatory Visit: Payer: Self-pay | Admitting: Physical Medicine and Rehabilitation

## 2019-07-30 DIAGNOSIS — G8929 Other chronic pain: Secondary | ICD-10-CM

## 2019-09-03 ENCOUNTER — Other Ambulatory Visit: Payer: Self-pay | Admitting: *Deleted

## 2019-09-03 DIAGNOSIS — M79604 Pain in right leg: Secondary | ICD-10-CM

## 2019-09-04 ENCOUNTER — Other Ambulatory Visit: Payer: Self-pay

## 2019-09-04 ENCOUNTER — Ambulatory Visit
Admission: RE | Admit: 2019-09-04 | Discharge: 2019-09-04 | Disposition: A | Discharge status: Home / Self Care | Payer: BC Managed Care – PPO | Source: Ambulatory Visit | Attending: Physical Medicine and Rehabilitation | Admitting: Physical Medicine and Rehabilitation

## 2019-09-04 ENCOUNTER — Ambulatory Visit (HOSPITAL_COMMUNITY)
Admission: RE | Admit: 2019-09-04 | Discharge: 2019-09-04 | Disposition: A | Discharge status: Home / Self Care | Payer: BC Managed Care – PPO | Source: Ambulatory Visit | Attending: Vascular Surgery | Admitting: Vascular Surgery

## 2019-09-04 DIAGNOSIS — M542 Cervicalgia: Secondary | ICD-10-CM

## 2019-09-04 DIAGNOSIS — M79604 Pain in right leg: Secondary | ICD-10-CM | POA: Diagnosis present

## 2019-09-04 NOTE — Progress Notes (Signed)
VASCULAR [facility] VEIN SPECIALISTS           OF [place]  History and Physical   Gregory Klein is a 54 y.o. male who presents hx of right leg DVT after arthroscopy and repair of right leg meniscus.  He was started on Xarelto in August.  He was seen by Dr. Oneida Alar in November 2020 for this.  At that time, pt's leg swelling was essentially resolved.  He had a venous duplex on 12/26/2018 and at that time, it showed residual thrombus at the very distal portion of the popliteal vein and was significantly improved from scan in August.  It was felt that since the usual course for [facility] is 3 months, he felt it was safe to stop Xarelto with minimal risk and pt was in agreement.  Dr. Oneida Alar felt if pt had recurrence, he would need a hypercoagulable workup.   He comes in today with c/o of occasional tightness in his left calf.  He states that he sometimes has mild swelling around his calf but not into his foot that he describes as tightening.  He states he gets occasional tingling behind his left knee.  He does not have frank pain.  He was concerned since he had the DVT in August and wanted to be evaluated.  He states he did not wear compression back when he had the DVT.      The patient has history of DVT. Pt does not have history of varicose vein.   Pt does not have history of skin changes in lower legs.   There is not family history of venous disorders.   The patient has not used compression stockings in the past.    The pt is not on a statin for cholesterol management.  The pt is not on a daily aspirin.   Other AC:  none The pt is on ARB for hypertension.   The pt is not diabetic.   Tobacco hx:  Never  He works as an Microbiologist at [employer].    Past Medical History:  Diagnosis Date  . Achilles tendonitis, bilateral 2016   [name], DPM  . Acute medial meniscus tear of right knee   . Acute myocarditis 2015   viral vs d/t strep infection, resolved w/o sequela  .  Allergy   . 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 GERD (gastroesophageal reflux disease)   . Hemorrhoids    hx of  . Hepatitis B infection    hx of dx 2005 aprox  . Hyperglycemia 02/22/2015  . Hypertension   . Neck pain    local injections x 2, las summer 2011 (Dr.Vortek)  . Plantar fasciitis, bilateral 2016   [name], DPM  . Sleep apnea    on CPAP     Past Surgical History:  Procedure Laterality Date  . INGUINAL HERNIA REPAIR    . KNEE ARTHROSCOPY  6-15   Dr Mardelle Matte  . KNEE ARTHROSCOPY WITH MEDIAL MENISECTOMY Right 09/26/2018  . LEFT HEART CATHETERIZATION WITH CORONARY ANGIOGRAM N/A 12/29/2013   Procedure: LEFT HEART CATHETERIZATION WITH CORONARY ANGIOGRAM;  Surgeon: [name], MD;  Location: [facility] CATH LAB;  Service: Cardiovascular;  Laterality: N/A;    Social History   Socioeconomic History  . Marital status: Single    Spouse name: Not on file  . Number of  children: 0  . Years of education: Not on file  . Highest education level: Not on file  Occupational History  . Occupation: Pension scheme manager: [employer]  Tobacco Use  . Smoking status: Never Smoker  . Smokeless tobacco: Never Used  Vaping Use  . Vaping Use: Never used  Substance and Sexual Activity  . Alcohol use: No    Alcohol/week: 0.0 standard drinks  . Drug use: No  . Sexual activity: Not on file  Other Topics Concern  . Not on file  Social History Narrative    Lives by himself   Social Determinants of Health   Financial Resource Strain:   . Difficulty of Paying Living Expenses:   Food Insecurity:   . Worried About Charity fundraiser in the Last Year:   . Arboriculturist in the Last Year:   Transportation Needs:   . Film/video editor (Medical):   Marland Kitchen Lack of Transportation (Non-Medical):   Physical Activity:   . Days of Exercise per Week:   . Minutes of Exercise per  Session:   Stress:   . Feeling of Stress :   Social Connections:   . Frequency of Communication with Friends and Family:   . Frequency of Social Gatherings with Friends and Family:   . Attends Religious Services:   . Active Member of Clubs or Organizations:   . Attends Archivist Meetings:   Marland Kitchen Marital Status:   Intimate Partner Violence:   . Fear of Current or Ex-Partner:   . Emotionally Abused:   Marland Kitchen Physically Abused:   . Sexually Abused:      Family History  Problem Relation Age of Onset  . Hypertension Mother   . Stroke Mother        M, Lillia Corporal  . Hypertension Father   . Kidney Stones Father   . Coronary artery disease Maternal Grandmother   . Hypertension Maternal Grandmother   . Diabetes Paternal Grandfather   . Prostate cancer Maternal Grandfather 21  . Breast cancer Paternal Grandmother   . Pancreatic cancer Paternal Aunt   . Colon cancer Neg Hx   . Esophageal cancer Neg Hx   . Kidney disease Neg Hx   . Liver cancer Neg Hx   . Rectal cancer Neg Hx   . Stomach cancer Neg Hx     Current Outpatient Medications  Medication Sig Dispense Refill  . cetirizine (ZYRTEC) 10 MG chewable tablet Chew 10 mg by mouth daily.    Marland Kitchen losartan (COZAAR) 50 MG tablet Take 1 tablet (50 mg total) by mouth daily. 90 tablet 1  . naproxen (NAPROSYN) 500 MG tablet Take 1 tablet (500 mg total) by mouth 2 (two) times daily. 30 tablet 0  . OVER THE COUNTER MEDICATION Place 1-2 drops into both eyes as needed (for dry eyes). "[facility] for Dry Eyes"    . polyethylene glycol (MIRALAX / GLYCOLAX) packet Take 17 g by mouth daily.    . Probiotic Product (PROBIOTIC DAILY PO) Take by mouth.    . psyllium (METAMUCIL) 58.6 % powder Take 1 packet by mouth as needed (for constipation). Reported on 05/07/2015    . valACYclovir (VALTREX) 1000 MG tablet TAKE 2 TABLETS BY MOUTH TWICE DAILY AS NEEDED FOR OUTBREAKS 30 tablet 6  . Vitamin D, Ergocalciferol, (DRISDOL) 1.25 MG (50000 UNIT) CAPS capsule Take  1 capsule (50,000 Units total) by mouth every 7 (seven) days. 4 capsule 0  No current facility-administered medications for this visit.    Allergies  Allergen Reactions  . Amoxicillin Rash    REVIEW OF SYSTEMS:   [X]  denotes positive finding, [ ]  denotes negative finding Cardiac  Comments:  Chest pain or chest pressure:    Shortness of breath upon exertion:    Short of breath when lying flat:    Irregular heart rhythm:        Vascular    Pain in calf, thigh, or hip brought on by ambulation:    Pain in feet at night that wakes you up from your sleep:     Blood clot in your veins:    Leg swelling:  x See HPI      Pulmonary    Oxygen at home:    Productive cough:     Wheezing:         Neurologic    Sudden weakness in arms or legs:     Sudden numbness in arms or legs:     Sudden onset of difficulty speaking or slurred speech:    Temporary loss of vision in one eye:     Problems with dizziness:         Gastrointestinal    Blood in stool:     Vomited blood:         Genitourinary    Burning when urinating:     Blood in urine:        Psychiatric    Major depression:         Hematologic    Bleeding problems:    Problems with blood clotting too easily:        Skin    Rashes or ulcers:        Constitutional    Fever or chills:      PHYSICAL EXAMINATION:  Today's Vitals   09/05/19 0819  BP: 136/85  Pulse: 77  Resp: 20  Temp: 98.5 F (36.9 C)  TempSrc: Temporal  SpO2: 100%  Weight: 202 lb 4.8 oz (91.8 kg)  Height: 5\' 10"  (1.778 m)  PainSc: 1    Body mass index is 29.03 kg/m.   General:  WDWN in NAD; vital signs documented above Gait: Not observed HENT: WNL, normocephalic Pulmonary: normal non-labored breathing without wheezing Cardiac: regular HR; without carotid bruits Abdomen: soft, NT, no masses Skin: without rashes Vascular Exam/Pulses:  Right Left  Radial 2+ (normal) 2+ (normal)  DP 2+ (normal) 2+ (normal)  PT 2+ (normal) 2+ (normal)     Extremities: without ischemic changes, without cellulitis; without open wounds; without skin color changes Musculoskeletal: no muscle wasting or atrophy  Neurologic: A&O X 3;  moving all extremities equally Psychiatric:  The pt has Normal affect.   Non-Invasive Vascular Imaging:   Venous duplex on 09/04/2019: Summary:  RIGHT:  - Findings consistent with chronic deep vein thrombosis involving the  right popliteal vein.  - There is no evidence of superficial venous thrombosis.    - No cystic structure found in the popliteal fossa.  - Deep venous insufficiency is noted at the distal popliteal /  tibio-peroneal trunk.    LEFT:  - No evidence of common femoral vein obstruction.   CHASTIN RIESGO is a 54 y.o. male with hx of DVT after right leg arthroscopy last August presents today with: tightening of his left calf with mild swelling  -pt duplex yesterday reveals residual chronic DVT that is non occlusive.  Discussed with MD and pt does not need AC  for this unless he develops new acute DVT.   -pt does have some venous insufficiency in the deep system. -discussed with pt about wearing knee high 15-46mmHg compression stockings and elevating legs.   Discussed with him that walking is good, which he does quite a bit of at work.  Discussed wearing compression when travelling given his hx of DVT.  -pt will f/u as needed.  Discussed with him should this get worse, we could see him back and do a formal venous reflux study    Leontine Locket, [facility] 09/04/2019 1:59 PM  Clinic MD:  Donzetta Matters

## 2019-09-05 ENCOUNTER — Ambulatory Visit: Payer: BC Managed Care – PPO | Admitting: Physician Assistant

## 2019-09-05 VITALS — BP 136/85 | HR 77 | Temp 98.5°F | Resp 20 | Ht 70.0 in | Wt 202.3 lb

## 2019-09-05 DIAGNOSIS — M7989 Other specified soft tissue disorders: Secondary | ICD-10-CM

## 2019-09-29 ENCOUNTER — Telehealth: Payer: Self-pay | Admitting: *Deleted

## 2019-09-29 NOTE — Telephone Encounter (Signed)
Patient called stating he has been traveling and is now in [facility].  Patient observed that his right calf was larger than his left calf, with his history of DVT he was seen at the hospital there. An ultrasound confirmed a DVT.  He was given a 30 day supply of Xarelto and told to f/u at [facility].  Patient is attending a meeting there for the week.  I instructed him to elevate whenever possible, walk instead of sitting long periods and to wear his compression stocking.  I explained to patient that if he experiences any shortness of breath or worsening pain or swelling to report to the ED. Patient voiced understanding of the instructions.

## 2019-10-02 ENCOUNTER — Telehealth: Payer: Self-pay

## 2019-10-02 NOTE — Telephone Encounter (Signed)
Pt called with request to have u/s of DVT behind R knee. We have r/s his appt for U/S and to see MD next Wed. Pt is aware of this appt and to go to ED if anything changes/worsens.

## 2019-10-03 ENCOUNTER — Other Ambulatory Visit: Payer: Self-pay | Admitting: *Deleted

## 2019-10-03 DIAGNOSIS — M7989 Other specified soft tissue disorders: Secondary | ICD-10-CM

## 2019-10-03 DIAGNOSIS — I82401 Acute embolism and thrombosis of unspecified deep veins of right lower extremity: Secondary | ICD-10-CM

## 2019-10-06 ENCOUNTER — Ambulatory Visit: Payer: BC Managed Care – PPO

## 2019-10-08 ENCOUNTER — Ambulatory Visit: Payer: BC Managed Care – PPO | Admitting: Vascular Surgery

## 2019-10-08 ENCOUNTER — Ambulatory Visit (HOSPITAL_COMMUNITY)
Admission: RE | Admit: 2019-10-08 | Discharge: 2019-10-08 | Disposition: A | Discharge status: Home / Self Care | Payer: BC Managed Care – PPO | Source: Ambulatory Visit | Attending: Vascular Surgery | Admitting: Vascular Surgery

## 2019-10-08 ENCOUNTER — Other Ambulatory Visit: Payer: Self-pay

## 2019-10-08 DIAGNOSIS — I82401 Acute embolism and thrombosis of unspecified deep veins of right lower extremity: Secondary | ICD-10-CM | POA: Diagnosis present

## 2019-10-08 DIAGNOSIS — M7989 Other specified soft tissue disorders: Secondary | ICD-10-CM | POA: Diagnosis not present

## 2019-10-10 ENCOUNTER — Ambulatory Visit: Payer: BC Managed Care – PPO | Admitting: Vascular Surgery

## 2019-10-10 ENCOUNTER — Other Ambulatory Visit: Payer: Self-pay

## 2019-10-10 ENCOUNTER — Encounter: Payer: Self-pay | Admitting: Vascular Surgery

## 2019-10-10 VITALS — BP 134/89 | HR 73 | Temp 98.6°F | Resp 20 | Ht 70.0 in | Wt 197.0 lb

## 2019-10-10 DIAGNOSIS — M7989 Other specified soft tissue disorders: Secondary | ICD-10-CM | POA: Diagnosis not present

## 2019-10-10 NOTE — Progress Notes (Signed)
Patient ID: Gregory INNISS, male   DOB: October 31, 1965, 54 y.o.   MRN: [id number]  Reason for Consult: Follow-up   Referred by Gregory Branch, MD  Subjective:     HPI:  Gregory Klein is a 53 y.o. male has a history of a right and was began on Xarelto.  He did complete this course saw Dr. Oneida Klein in November this was discontinued.  He then most recently traveled to [place] returned and travel to [facility] and had repeat swelling underwent a DVT study there which by report demonstrated chronic thrombus.  Earlier this week he also underwent DVT study.  He states that during his trip he did have swelling below the knee.  He has never had swelling above the knee.  He did not have any redness or pain.  Swelling has resolved.  He does have a compression sock although he only wears it when he is at work wearing pants.  Overall he is happy that he is not having any pain at this time.  He is taking Xarelto that was prescribed to him in [facility] and will complete 30-day course.  He has never taken aspirin in the past.  Past Medical History:  Diagnosis Date  . Achilles tendonitis, bilateral 2016   Gregory Klein, DPM  . Acute medial meniscus tear of right knee   . Acute myocarditis 2015   viral vs d/t strep infection, resolved w/o sequela  . Allergy   . 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 DVT (deep venous thrombosis) ([place])   . GERD (gastroesophageal reflux disease)   . Hemorrhoids    hx of  . Hepatitis B infection    hx of dx 2005 aprox  . Hyperglycemia 02/22/2015  . Hypertension   . Neck pain    local injections x 2, las summer 2011 (Gregory Klein)  . Plantar fasciitis, bilateral 2016   Gregory Klein, DPM  . Sleep apnea    on CPAP    Family History  Problem Relation Age of Onset  . Hypertension Mother   . Stroke Mother        Gregory Klein  . Hypertension Father   . Kidney Stones Father   . Coronary  artery disease Maternal Grandmother   . Hypertension Maternal Grandmother   . Diabetes Paternal Grandfather   . Prostate cancer Maternal Grandfather 55  . Breast cancer Paternal Grandmother   . Pancreatic cancer Paternal Aunt   . Gregory cancer Neg Hx   . Esophageal cancer Neg Hx   . Kidney disease Neg Hx   . Liver cancer Neg Hx   . Rectal cancer Neg Hx   . Stomach cancer Neg Hx    Past Surgical History:  Procedure Laterality Date  . INGUINAL HERNIA REPAIR    . KNEE ARTHROSCOPY  6-15   Dr Gregory Klein  . KNEE ARTHROSCOPY WITH MEDIAL MENISECTOMY Right 09/26/2018  . LEFT HEART CATHETERIZATION WITH CORONARY ANGIOGRAM N/A 12/29/2013   Procedure: LEFT HEART CATHETERIZATION WITH CORONARY ANGIOGRAM;  Surgeon: Gregory Hector, MD;  Location: [facility] CATH LAB;  Service: Cardiovascular;  Laterality: N/A;    Short Social History:  Social History   Tobacco Use  . Smoking status: Never Smoker  . Smokeless tobacco: Never Used  Substance Use Topics  . Alcohol use: No    Alcohol/week: 0.0 standard drinks    Allergies  Allergen Reactions  .  Amoxicillin Rash    Current Outpatient Medications  Medication Sig Dispense Refill  . cetirizine (ZYRTEC) 10 MG chewable tablet Chew 10 mg by mouth daily.    Marland Kitchen losartan (COZAAR) 50 MG tablet Take 1 tablet (50 mg total) by mouth daily. 90 tablet 1  . naproxen (NAPROSYN) 500 MG tablet Take 1 tablet (500 mg total) by mouth 2 (two) times daily. 30 tablet 0  . OVER THE COUNTER MEDICATION Place 1-2 drops into both eyes as needed (for dry eyes). "[facility] for Dry Eyes"    . polyethylene glycol (MIRALAX / GLYCOLAX) packet Take 17 g by mouth daily.    . Probiotic Product (PROBIOTIC DAILY PO) Take by mouth.    . psyllium (METAMUCIL) 58.6 % powder Take 1 packet by mouth as needed (for constipation). Reported on 05/07/2015    . valACYclovir (VALTREX) 1000 MG tablet TAKE 2 TABLETS BY MOUTH TWICE DAILY AS NEEDED FOR OUTBREAKS 30 tablet 6  . Vitamin D, Ergocalciferol,  (DRISDOL) 1.25 MG (50000 UNIT) CAPS capsule Take 1 capsule (50,000 Units total) by mouth every 7 (seven) days. 4 capsule 0  . XARELTO STARTER PACK Take by mouth as directed.     No current facility-administered medications for this visit.    Review of Systems  Constitutional:  Constitutional negative. HENT: HENT negative.  Eyes: Eyes negative.  Respiratory: Respiratory negative.  Cardiovascular: Positive for leg swelling.  GI: Gastrointestinal negative.  Musculoskeletal: Musculoskeletal negative.  Skin: Skin negative.  Neurological: Neurological negative. Hematologic: Hematologic/lymphatic negative.  Psychiatric: Psychiatric negative.        Objective:  Objective   Vitals:   10/10/19 1408  BP: 134/89  Pulse: 73  Resp: 20  Temp: 98.6 F (37 C)  SpO2: 98%  Weight: 197 lb (89.4 kg)  Height: 5\' 10"  (1.778 m)   Body mass index is 28.27 kg/m.  Physical Exam HENT:     Nose:     Comments: Mask in place Eyes:     Pupils: Pupils are equal, round, and reactive to light.  Cardiovascular:     Rate and Rhythm: Normal rate.     Pulses: Normal pulses.  Abdominal:     General: Abdomen is flat.     Palpations: Abdomen is soft. There is no mass.  Musculoskeletal:        General: No swelling. Normal range of motion.     Cervical back: Normal range of motion and neck supple.     Right lower leg: No edema.     Left lower leg: No edema.  Skin:    General: Skin is warm and dry.     Capillary Refill: Capillary refill takes less than 2 seconds.  Neurological:     General: No focal deficit present.     Mental Status: He is alert.  Psychiatric:        Mood and Affect: Mood normal.        Behavior: Behavior normal.        Thought Content: Thought content normal.        Judgment: Judgment normal.     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    LEFT:  - No evidence of common femoral vein  obstruction.        Assessment/Plan:     54 year old male with the above-noted history of DVT that appears to only have been 1 case of DVT in the femoral and popliteal  vein.  He appears to have chronic thrombus in his popliteal vein which is likely the cause of his symptoms.  I discussed with him that this is post thrombotic syndrome and he could likely have swelling the rest of his life although he does not have any swelling on today's exam no skin changes.  He will complete his course of Xarelto and then transition to 81 mg of aspirin indefinitely.  He can follow-up with [place] on an as-needed basis.    Waynetta Sandy MD [facility]

## 2019-10-27 ENCOUNTER — Telehealth: Payer: Self-pay | Admitting: Internal Medicine

## 2019-10-27 MED ORDER — LOSARTAN POTASSIUM 50 MG PO TABS: 50.0000 mg | ORAL_TABLET | Freq: Every day | ORAL | 1 refills | Status: DC

## 2019-10-27 NOTE — Telephone Encounter (Signed)
Medication: losartan (COZAAR) 50 MG tablet [[id number]   Has the patient contacted their pharmacy? No. (If no, request that the patient contact the pharmacy for the refill.) (If yes, when and what did the pharmacy advise?)  Preferred Pharmacy (with phone number or street name): [facility], [place] [address] [place] [facility]  [address] [place] [address]  Phone:  [phone] Fax:  [phone]  DEA #:  [id number]  Agent: Please be advised that RX refills may take up to 3 business days. We ask that you follow-up with your pharmacy.

## 2019-10-27 NOTE — Telephone Encounter (Signed)
Rx sent 

## 2020-01-05 ENCOUNTER — Encounter: Payer: BC Managed Care – PPO | Admitting: Internal Medicine

## 2020-01-05 ENCOUNTER — Other Ambulatory Visit: Payer: Self-pay

## 2020-01-06 ENCOUNTER — Ambulatory Visit (INDEPENDENT_AMBULATORY_CARE_PROVIDER_SITE_OTHER): Payer: BC Managed Care – PPO | Admitting: Internal Medicine

## 2020-01-06 ENCOUNTER — Encounter: Payer: Self-pay | Admitting: Internal Medicine

## 2020-01-06 VITALS — BP 141/92 | HR 76 | Temp 97.9°F | Resp 16 | Ht 70.0 in | Wt 203.4 lb

## 2020-01-06 DIAGNOSIS — I10 Essential (primary) hypertension: Secondary | ICD-10-CM | POA: Diagnosis not present

## 2020-01-06 DIAGNOSIS — R739 Hyperglycemia, unspecified: Secondary | ICD-10-CM

## 2020-01-06 DIAGNOSIS — Z0000 Encounter for general adult medical examination without abnormal findings: Secondary | ICD-10-CM | POA: Diagnosis not present

## 2020-01-06 DIAGNOSIS — Z23 Encounter for immunization: Secondary | ICD-10-CM

## 2020-01-06 DIAGNOSIS — E559 Vitamin D deficiency, unspecified: Secondary | ICD-10-CM | POA: Diagnosis not present

## 2020-01-06 DIAGNOSIS — Z125 Encounter for screening for malignant neoplasm of prostate: Secondary | ICD-10-CM

## 2020-01-06 LAB — COMPREHENSIVE METABOLIC PANEL
ALT: 19 U/L (ref 0–53)
AST: 14 U/L (ref 0–37)
Albumin: 3.9 g/dL (ref 3.5–5.2)
Alkaline Phosphatase: 58 U/L (ref 39–117)
BUN: 12 mg/dL (ref 6–23)
CO2: 34 mEq/L — ABNORMAL HIGH (ref 19–32)
Calcium: 8.8 mg/dL (ref 8.4–10.5)
Chloride: 100 mEq/L (ref 96–112)
Creatinine, Ser: 1.21 mg/dL (ref 0.40–1.50)
GFR: 68.7 mL/min (ref >60.00)
Glucose, Bld: 103 mg/dL — ABNORMAL HIGH (ref 70–99)
Potassium: 4.6 mEq/L (ref 3.5–5.1)
Sodium: 137 mEq/L (ref 135–145)
Total Bilirubin: 0.9 mg/dL (ref 0.2–1.2)
Total Protein: 6.4 g/dL (ref 6.0–8.3)

## 2020-01-06 LAB — HEMOGLOBIN A1C: Hgb A1c MFr Bld: 6.1 % (ref 4.6–6.5)

## 2020-01-06 LAB — LIPID PANEL
Cholesterol: 172 mg/dL (ref 0–200)
HDL: 54.8 mg/dL (ref >39.00)
LDL Cholesterol: 109 mg/dL — ABNORMAL HIGH (ref 0–99)
NonHDL: 117.32
Total CHOL/HDL Ratio: 3
Triglycerides: 42 mg/dL (ref 0.0–149.0)
VLDL: 8.4 mg/dL (ref 0.0–40.0)

## 2020-01-06 LAB — PSA: PSA: 0.77 ng/mL (ref 0.10–4.00)

## 2020-01-06 LAB — TSH: TSH: 1.73 u[IU]/mL (ref 0.35–4.50)

## 2020-01-06 LAB — VITAMIN D 25 HYDROXY (VIT D DEFICIENCY, FRACTURES): VITD: 22.47 ng/mL — ABNORMAL LOW (ref 30.00–100.00)

## 2020-01-06 NOTE — Progress Notes (Signed)
Subjective:    Patient ID: Gregory Klein, male    DOB: 1965-06-10, 54 y.o.   MRN: [id number]  DOS:  01/06/2020 Type of visit - description: CPX  Recently had back pain, under the care of Ortho. BP noted to be slightly elevated Note from vascular surgery reviewed, he had right lower extremity edema.  BP Readings from Last 3 Encounters:  01/06/20 (!) 141/92  10/10/19 134/89  09/05/19 136/85     Review of Systems  Other than above, a 14 point review of systems is negative     Past Medical History:  Diagnosis Date  . Achilles tendonitis, bilateral 2016   [name], DPM  . Acute medial meniscus tear of right knee   . Acute myocarditis 2015   viral vs d/t strep infection, resolved w/o sequela  . Allergy   . 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 DVT (deep venous thrombosis) ([place])   . GERD (gastroesophageal reflux disease)   . Hemorrhoids    hx of  . Hepatitis B infection    hx of dx 2005 aprox  . Hyperglycemia 02/22/2015  . Hypertension   . Neck pain    local injections x 2, las summer 2011 (Dr.Vortek)  . Plantar fasciitis, bilateral 2016   [name], DPM  . Sleep apnea    on CPAP     Past Surgical History:  Procedure Laterality Date  . INGUINAL HERNIA REPAIR    . KNEE ARTHROSCOPY  6-15   Dr Mardelle Matte  . KNEE ARTHROSCOPY WITH MEDIAL MENISECTOMY Right 09/26/2018  . LEFT HEART CATHETERIZATION WITH CORONARY ANGIOGRAM N/A 12/29/2013   Procedure: LEFT HEART CATHETERIZATION WITH CORONARY ANGIOGRAM;  Surgeon: [name], MD;  Location: [facility] CATH LAB;  Service: Cardiovascular;  Laterality: N/A;    Allergies as of 01/06/2020      Reactions   Amoxicillin Rash      Medication List       Accurate as of January 06, 2020 11:59 PM. If you have any questions, ask your nurse or doctor.        STOP taking these medications   diclofenac 50 MG tablet Commonly known as:  CATAFLAM Stopped by: Kathlene November, MD   naproxen 500 MG tablet Commonly known as: NAPROSYN Stopped by: Kathlene November, MD   Xarelto Starter Pack Generic drug: Rivaroxaban Stater Pack (15 mg and 20 mg) Stopped by: Kathlene November, MD     TAKE these medications   aspirin EC 81 MG tablet Take 81 mg by mouth daily. Swallow whole.   cetirizine 10 MG chewable tablet Commonly known as: ZYRTEC Chew 10 mg by mouth daily.   losartan 50 MG tablet Commonly known as: COZAAR Take 1 tablet (50 mg total) by mouth daily.   OVER THE COUNTER MEDICATION Place 1-2 drops into both eyes as needed (for dry eyes). "[place] for Dry Eyes"   polyethylene glycol 17 g packet Commonly known as: MIRALAX / GLYCOLAX Take 17 g by mouth daily.   PROBIOTIC DAILY PO Take by mouth.   psyllium 58.6 % powder Commonly known as: METAMUCIL Take 1 packet by mouth as needed (for constipation). Reported on 05/07/2015   valACYclovir 1000 MG tablet Commonly known as: VALTREX TAKE 2 TABLETS BY MOUTH TWICE DAILY AS NEEDED FOR OUTBREAKS   Vitamin D (Ergocalciferol) 1.25 MG (50000 UNIT) Caps capsule Commonly known as: DRISDOL Take 1 capsule (  50,000 Units total) by mouth every 7 (seven) days.   Vitamin D 50 MCG (2000 UT) tablet Take 2,000 Units by mouth daily.          Objective:   Physical Exam BP (!) 141/92 (BP Location: Left Arm, Patient Position: Sitting, Cuff Size: Normal)   Pulse 76   Temp 97.9 F (36.6 C) (Oral)   Resp 16   Ht 5\' 10"  (1.778 m)   Wt 203 lb 6 oz (92.3 kg)   SpO2 100%   BMI 29.18 kg/m  General: Well developed, NAD, BMI noted Neck: No  thyromegaly  HEENT:  Normocephalic . Face symmetric, atraumatic Lungs:  CTA B Normal respiratory effort, no intercostal retractions, no accessory muscle use. Heart: RRR,  no murmur.  Abdomen:  Not distended, soft, non-tender. No rebound or rigidity.   Lower extremities: no pretibial edema bilaterally  Skin: Exposed areas without rash. Not pale. Not  jaundice Neurologic:  alert & oriented X3.  Speech normal, gait appropriate for age and unassisted Strength symmetric and appropriate for age.  Psych: Cognition and judgment appear intact.  Cooperative with normal attention span and concentration.  Behavior appropriate. No anxious or depressed appearing.     Assessment        Assessment  Prediabetes A1C 5.9 (12-2014) HTN OSA on CPAP --DVT: 09/2018: R leg DVT 3 weeks after R knee arthoscopy. 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 Adline Peals), occ numbness R thumb,  Injection 01/2019 Urolithiasis ER 04-30-25  PLAN Here for CPX Prediabetes: Check A1c HTN: BP slightly elevated today in the 141/92, similar reading a couple of weeks ago, we agreed to continue losartan, monitor BPs, call if not at goal. Leg edema: Had right leg edema after a trip, saw vascular surgery, dx w./ post thombotic syndrome, Rx aspirin and compression stocking for life. Vitamin D deficiency: Finished ergocalciferol, not on supplements, check levels, recommend vitamin D 2000 units daily Back pain, neck pain: Recently had an injection on the low back, that helped the pain and the radiation to the right leg.  Taking Tylenol and NSAIDs as needed. RTC 1 year as long as BPs control  This visit occurred during the SARS-CoV-2 public health emergency.  Safety protocols were in place, including screening questions prior to the visit, additional usage of staff PPE, and extensive cleaning of exam room while observing appropriate contact time as indicated for disinfecting solutions.

## 2020-01-06 NOTE — Progress Notes (Signed)
Pre visit review using our clinic review tool, if applicable. No additional management support is needed unless otherwise documented below in the visit note. 

## 2020-01-06 NOTE — Patient Instructions (Signed)
Check the  blood pressure regularly BP GOAL is between 110/65 and  135/85. If it is consistently higher or lower, let me know  Do not forget to take an aspirin 81 mg daily  Go back on vitamin D 3: 2000 units daily  GO TO THE LAB : Get the blood work     [place], Tw[place] back for   a physical exam in 1 year

## 2020-01-07 ENCOUNTER — Encounter: Payer: Self-pay | Admitting: Internal Medicine

## 2020-01-07 MED ORDER — VITAMIN D (ERGOCALCIFEROL) 1.25 MG (50000 UNIT) PO CAPS: 50000.0000 [IU] | ORAL_CAPSULE | ORAL | 0 refills | Status: DC

## 2020-01-07 NOTE — Assessment & Plan Note (Signed)
-  Tdap 2019 - PNA (23): 12/28/13  -Covid vaccine x2 and a booster - flu shot today -CCS- 04/17/02;  cscope 07/2017 + polyp, 5 years -Prostate cancer screening: DRE - PSA wnl 12-2018, request PSA, ordered  -diet,exercise: Discussed -Labs: Vitamin D, A1c, CMP, FLP, TSH, PSA

## 2020-01-07 NOTE — Assessment & Plan Note (Signed)
Here for CPX Prediabetes: Check A1c HTN: BP slightly elevated today in the 141/92, similar reading a couple of weeks ago, we agreed to continue losartan, monitor BPs, call if not at goal. Leg edema: Had right leg edema after a trip, saw vascular surgery, dx w./ post thombotic syndrome, Rx aspirin and compression stocking for life. Vitamin D deficiency: Finished ergocalciferol, not on supplements, check levels, recommend vitamin D 2000 units daily Back pain, neck pain: Recently had an injection on the low back, that helped the pain and the radiation to the right leg.  Taking Tylenol and NSAIDs as needed. RTC 1 year as long as BPs control

## 2020-03-28 ENCOUNTER — Other Ambulatory Visit: Payer: Self-pay | Admitting: Internal Medicine

## 2020-04-21 ENCOUNTER — Other Ambulatory Visit: Payer: Self-pay | Admitting: Internal Medicine

## 2020-05-11 ENCOUNTER — Other Ambulatory Visit: Payer: Self-pay | Admitting: Internal Medicine

## 2020-05-12 ENCOUNTER — Telehealth: Payer: Self-pay | Admitting: Internal Medicine

## 2020-05-12 NOTE — Telephone Encounter (Signed)
Medication:valACYclovir (VALTREX) 1000 MG tablet [[id number]]     Has the patient contacted their pharmacy? NO (If no, request that the patient contact the pharmacy for the refill.) (If yes, when and what did the pharmacy advise?)    Preferred Pharmacy (with phone number or street name):   [place], [place] - [place] AT [place] Phone:  [phone]  Fax:  [phone]         Agent: Please be advised that RX refills may take up to 3 business days. We ask that you follow-up with your pharmacy.

## 2020-05-12 NOTE — Telephone Encounter (Signed)
Refills sent earlier today.

## 2020-07-15 ENCOUNTER — Telehealth: Payer: Self-pay | Admitting: *Deleted

## 2020-07-15 NOTE — Telephone Encounter (Signed)
Appt scheduled 07/27/20.

## 2020-07-15 NOTE — Telephone Encounter (Signed)
Tried calling Pt to schedule an office visit regarding his bp, no answer, unable to leave message (voicemail box is full).

## 2020-07-15 NOTE — Telephone Encounter (Signed)
Who Is Calling Patient / Member / Family / Caregiver Call Type Triage / Clinical Relationship To Patient Self Return Phone Number [phone] (Secondary) Chief Complaint Blood Pressure High Reason for Call Symptomatic / Request for Health Information Initial Comment Caller has had High blood pressure since last Friday.148/94. Caller is on a blood pressure medication. Caller would like blood work done to check his A1c and Cholesterol. Caller was transferred by his office. 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 Caller states the second ph# is for his office and it is okay to tell reception you are calling from [facility] for him. Sees Dr. [name] Translation No Nurse Assessment Nurse: Renne Crigler, RN, Sherlie Ban Date/Time Eilene Ghazi Time): 07/15/2020 8:48:04 AM Confirm and document reason for call. If symptomatic, describe symptoms. ---Caller has had High blood pressure since last Friday.148/94 this morning. Caller is on a blood pressure medication, Losartan. Caller would like blood work done to check his A1c and Cholesterol.   Disp. Time Eilene Ghazi Time) Disposition Final User 07/15/2020 8:38:38 AM Attempt made - no message left Renne Crigler, RN, Sherlie Ban 07/15/2020 8:58:11 AM See PCP within 2 Weeks Yes Hearon, RN, Sherlie Ban

## 2020-07-27 ENCOUNTER — Encounter: Payer: Self-pay | Admitting: Internal Medicine

## 2020-07-27 ENCOUNTER — Ambulatory Visit: Payer: BC Managed Care – PPO | Admitting: Internal Medicine

## 2020-07-27 ENCOUNTER — Other Ambulatory Visit: Payer: Self-pay

## 2020-07-27 VITALS — BP 136/87 | HR 73 | Temp 97.3°F | Ht 70.0 in | Wt 196.0 lb

## 2020-07-27 DIAGNOSIS — E559 Vitamin D deficiency, unspecified: Secondary | ICD-10-CM

## 2020-07-27 DIAGNOSIS — I10 Essential (primary) hypertension: Secondary | ICD-10-CM

## 2020-07-27 DIAGNOSIS — R739 Hyperglycemia, unspecified: Secondary | ICD-10-CM | POA: Diagnosis not present

## 2020-07-27 DIAGNOSIS — E785 Hyperlipidemia, unspecified: Secondary | ICD-10-CM

## 2020-07-27 NOTE — Progress Notes (Signed)
Subjective:    Patient ID: Gregory Klein, male    DOB: Oct 18, 1965, 55 y.o.   MRN: [id number]  DOS:  07/27/2020 Type of visit - description: Routine checkup Patient is somewhat concerned about his blood pressure, cholesterol and blood sugar. A couple weeks ago started to eat healthier, has lost some weight.   BP Readings from Last 3 Encounters:  07/27/20 136/87  01/06/20 (!) 141/92  10/10/19 134/89   Wt Readings from Last 3 Encounters:  07/27/20 196 lb (88.9 kg)  01/06/20 203 lb 6 oz (92.3 kg)  10/10/19 197 lb (89.4 kg)     Review of Systems See above   Past Medical History:  Diagnosis Date  . Achilles tendonitis, bilateral 2016   [name], DPM  . Acute medial meniscus tear of right knee   . Acute myocarditis 2015   viral vs d/t strep infection, resolved w/o sequela  . Allergy   . 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 DVT (deep venous thrombosis) ([place])   . GERD (gastroesophageal reflux disease)   . Hemorrhoids    hx of  . Hepatitis B infection    hx of dx 2005 aprox  . Hyperglycemia 02/22/2015  . Hypertension   . Neck pain    local injections x 2, las summer 2011 (Dr.Vortek)  . Plantar fasciitis, bilateral 2016   [name], DPM  . Sleep apnea    on CPAP     Past Surgical History:  Procedure Laterality Date  . INGUINAL HERNIA REPAIR    . KNEE ARTHROSCOPY  6-15   Dr Mardelle Matte  . KNEE ARTHROSCOPY WITH MEDIAL MENISECTOMY Right 09/26/2018  . LEFT HEART CATHETERIZATION WITH CORONARY ANGIOGRAM N/A 12/29/2013   Procedure: LEFT HEART CATHETERIZATION WITH CORONARY ANGIOGRAM;  Surgeon: [name], MD;  Location: [facility] CATH LAB;  Service: Cardiovascular;  Laterality: N/A;    Allergies as of 07/27/2020      Reactions   Amoxicillin Rash      Medication List       Accurate as of Jul 27, 2020  1:32 PM. If you have any questions, ask your nurse or doctor.         aspirin EC 81 MG tablet Take 81 mg by mouth daily. Swallow whole.   cetirizine 10 MG chewable tablet Commonly known as: ZYRTEC Chew 10 mg by mouth daily.   diclofenac 75 MG EC tablet Commonly known as: VOLTAREN Take 75 mg by mouth 2 (two) times daily.   losartan 50 MG tablet Commonly known as: COZAAR Take 1 tablet (50 mg total) by mouth daily.   OVER THE COUNTER MEDICATION Place 1-2 drops into both eyes as needed (for dry eyes). "[place] for Dry Eyes"   polyethylene glycol 17 g packet Commonly known as: MIRALAX / GLYCOLAX Take 17 g by mouth daily.   PROBIOTIC DAILY PO Take by mouth.   psyllium 58.6 % powder Commonly known as: METAMUCIL Take 1 packet by mouth as needed (for constipation). Reported on 05/07/2015   valACYclovir 1000 MG tablet Commonly known as: VALTREX TAKE 2 TABLETS BY MOUTH TWICE DAILY AS NEEDED FOR OUTBREAKS   Vitamin D (Ergocalciferol) 1.25 MG (50000 UNIT) Caps capsule Commonly known as: DRISDOL Take 1 capsule (50,000 Units total) by mouth every 7 (seven) days.   Vitamin D 50 MCG (2000 UT) tablet Take 2,000 Units by mouth daily.  Objective:   Physical Exam BP 136/87 (BP Location: Left Arm, Patient Position: Sitting, Cuff Size: Large)   Pulse 73   Temp (!) 97.3 F (36.3 C) (Temporal)   Ht 5\' 10"  (1.778 m)   Wt 196 lb (88.9 kg)   SpO2 100%   BMI 28.12 kg/m  General:   Well developed, NAD, BMI noted. HEENT:  Normocephalic . Face symmetric, atraumatic Lungs:  CTA B Normal respiratory effort, no intercostal retractions, no accessory muscle use. Heart: RRR,  no murmur.  Lower extremities: no pretibial edema bilaterally  Skin: Not pale. Not jaundice Neurologic:  alert & oriented X3.  Speech normal, gait appropriate for age and unassisted Psych--  Cognition and judgment appear intact.  Cooperative with normal attention span and concentration.  Behavior appropriate. No anxious or depressed appearing.      Assessment     ASSESSMENT Prediabetes A1C 5.9 (12-2014) HTN OSA on CPAP --DVT: 09/2018: R leg DVT 3 weeks after R knee arthoscopy. 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 Adline Peals), occ numbness R thumb,  Injection 01/2019 Urolithiasis ER 04-30-25  PLAN HTN: Good compliance with Losartan.  Ambulatory BPs typically okay, from time to time he sees numbers in the 150s over 90. He already started to eat healthier, has lost some weight.  I emphasized the need to eat low-salt.  No change for now, continue monitoring. Hyperglycemia: Request A1c and FLP, will do. Vitamin D deficiency: Had ergocalciferol, not taking  vitamin D consistently, plan: Check vitamin D per patient request, take OTC Vit d 2000 units daily. FH prostate cancer: Father was recently diagnosed with prostate cancer but doing well. Back pain: Has back pain with right-sided radiculopathy, still having some issues, sees the specialist RTC CPX 12-2020    This visit occurred during the SARS-CoV-2 public health emergency.  Safety protocols were in place, including screening questions prior to the visit, additional usage of staff PPE, and extensive cleaning of exam room while observing appropriate contact time as indicated for disinfecting solutions.

## 2020-07-27 NOTE — Patient Instructions (Addendum)
Check the  blood pressure 2 or 3 times a month   BP GOAL is between 110/65 and  135/85. If it is consistently higher or lower, let me know   Take Vit D OTC 2000 units a day    GO TO THE LAB : Get the blood work     [place], [place] Come back for  A physical exam by 12-2020        Low-Sodium Eating Plan Sodium, which is an element that makes up salt, helps you maintain a healthy balance of fluids in your body. Too much sodium can increase your blood pressure and cause fluid and waste to be held in your body. Your health care provider or dietitian may recommend following this plan if you have high blood pressure (hypertension), kidney disease, liver disease, or heart failure. Eating less sodium can help lower your blood pressure, reduce swelling, and protect your heart, liver, and kidneys. What are tips for following this plan? Reading food labels  The Nutrition Facts label lists the amount of sodium in one serving of the food. If you eat more than one serving, you must multiply the listed amount of sodium by the number of servings.  Choose foods with less than 140 mg of sodium per serving.  Avoid foods with 300 mg of sodium or more per serving. Shopping  Look for lower-sodium products, often labeled as "low-sodium" or "no salt added."  Always check the sodium content, even if foods are labeled as "unsalted" or "no salt added."  Buy fresh foods. ? Avoid canned foods and pre-made or frozen meals. ? Avoid canned, cured, or processed meats.  Buy breads that have less than 80 mg of sodium per slice.   Cooking  Eat more home-cooked food and less restaurant, buffet, and fast food.  Avoid adding salt when cooking. Use salt-free seasonings or herbs instead of table salt or sea salt. Check with your health care provider or pharmacist before using salt substitutes.  Cook with plant-based oils, such as canola, sunflower, or olive oil.   Meal  planning  When eating at a restaurant, ask that your food be prepared with less salt or no salt, if possible. Avoid dishes labeled as brined, pickled, cured, smoked, or made with soy sauce, miso, or teriyaki sauce.  Avoid foods that contain MSG (monosodium glutamate). MSG is sometimes added to [place] food, bouillon, and some canned foods.  Make meals that can be grilled, baked, poached, roasted, or steamed. These are generally made with less sodium. General information Most people on this plan should limit their sodium intake to 1,500-2,000 mg (milligrams) of sodium each day. What foods should I eat? Fruits Fresh, frozen, or canned fruit. Fruit juice. Vegetables Fresh or frozen vegetables. "No salt added" canned vegetables. "No salt added" tomato sauce and paste. Low-sodium or reduced-sodium tomato and vegetable juice. Grains Low-sodium cereals, including oats, puffed wheat and rice, and shredded wheat. Low-sodium crackers. Unsalted rice. Unsalted pasta. Low-sodium bread. Whole-grain breads and whole-grain pasta. Meats and other proteins Fresh or frozen (no salt added) meat, poultry, seafood, and fish. Low-sodium canned tuna and salmon. Unsalted nuts. Dried peas, beans, and lentils without added salt. Unsalted canned beans. Eggs. Unsalted nut butters. Dairy Milk. Soy milk. Cheese that is naturally low in sodium, such as ricotta cheese, fresh mozzarella, or Swiss cheese. Low-sodium or reduced-sodium cheese. Cream cheese. Yogurt. Seasonings and condiments Fresh and dried herbs and spices. Salt-free seasonings. Low-sodium mustard and ketchup.  Sodium-free salad dressing. Sodium-free light mayonnaise. Fresh or refrigerated horseradish. Lemon juice. Vinegar. Other foods Homemade, reduced-sodium, or low-sodium soups. Unsalted popcorn and pretzels. Low-salt or salt-free chips. The items listed above may not be a complete list of foods and beverages you can eat. Contact a dietitian for more  information. What foods should I avoid? Vegetables Sauerkraut, pickled vegetables, and relishes. Olives. [place] fries. Onion rings. Regular canned vegetables (not low-sodium or reduced-sodium). Regular canned tomato sauce and paste (not low-sodium or reduced-sodium). Regular tomato and vegetable juice (not low-sodium or reduced-sodium). Frozen vegetables in sauces. Grains Instant hot cereals. Bread stuffing, pancake, and biscuit mixes. Croutons. Seasoned rice or pasta mixes. Noodle soup cups. Boxed or frozen macaroni and cheese. Regular salted crackers. Self-rising flour. Meats and other proteins Meat or fish that is salted, canned, smoked, spiced, or pickled. Precooked or cured meat, such as sausages or meat loaves. [place] Salines. Ham. Pepperoni. Hot dogs. Corned beef. Chipped beef. Salt pork. Jerky. Pickled herring. Anchovies and sardines. Regular canned tuna. Salted nuts. Dairy Processed cheese and cheese spreads. Hard cheeses. Cheese curds. Blue cheese. Feta cheese. String cheese. Regular cottage cheese. Buttermilk. Canned milk. Fats and oils Salted butter. Regular margarine. Ghee. Bacon fat. Seasonings and condiments Onion salt, garlic salt, seasoned salt, table salt, and sea salt. Canned and packaged gravies. Worcestershire sauce. Tartar sauce. Barbecue sauce. Teriyaki sauce. Soy sauce, including reduced-sodium. Steak sauce. Fish sauce. Oyster sauce. Cocktail sauce. Horseradish that you find on the shelf. Regular ketchup and mustard. Meat flavorings and tenderizers. Bouillon cubes. Hot sauce. Pre-made or packaged marinades. Pre-made or packaged taco seasonings. Relishes. Regular salad dressings. Salsa. Other foods Salted popcorn and pretzels. Corn chips and puffs. Potato and tortilla chips. Canned or dried soups. Pizza. Frozen entrees and pot pies. The items listed above may not be a complete list of foods and beverages you should avoid. Contact a dietitian for more information. Summary  Eating less  sodium can help lower your blood pressure, reduce swelling, and protect your heart, liver, and kidneys.  Most people on this plan should limit their sodium intake to 1,500-2,000 mg (milligrams) of sodium each day.  Canned, boxed, and frozen foods are high in sodium. Restaurant foods, fast foods, and pizza are also very high in sodium. You also get sodium by adding salt to food.  Try to cook at home, eat more fresh fruits and vegetables, and eat less fast food and canned, processed, or prepared foods. This information is not intended to replace advice given to you by your health care provider. Make sure you discuss any questions you have with your health care provider. Document Revised: 03/21/2019 Document Reviewed: 01/15/2019 Elsevier Patient Education  2021 [employer].

## 2020-07-27 NOTE — Assessment & Plan Note (Signed)
HTN: Good compliance with Losartan.  Ambulatory BPs typically okay, from time to time he sees numbers in the 150s over 90. He already started to eat healthier, has lost some weight.  I emphasized the need to eat low-salt.  No change for now, continue monitoring. Hyperglycemia: Request A1c and FLP, will do. Vitamin D deficiency: Had ergocalciferol, not taking  vitamin D consistently, plan: Check vitamin D per patient request, take OTC Vit d 2000 units daily. FH prostate cancer: Father was recently diagnosed with prostate cancer but doing well. Back pain: Has back pain with right-sided radiculopathy, still having some issues, sees the specialist RTC CPX 12-2020

## 2020-07-28 LAB — LIPID PANEL
Cholesterol: 208 mg/dL — ABNORMAL HIGH (ref 0–200)
HDL: 53.3 mg/dL (ref >39.00)
LDL Cholesterol: 144 mg/dL — ABNORMAL HIGH (ref 0–99)
NonHDL: 154.4
Total CHOL/HDL Ratio: 4
Triglycerides: 54 mg/dL (ref 0.0–149.0)
VLDL: 10.8 mg/dL (ref 0.0–40.0)

## 2020-07-28 LAB — VITAMIN D 25 HYDROXY (VIT D DEFICIENCY, FRACTURES): VITD: 36.51 ng/mL (ref 30.00–100.00)

## 2020-07-28 LAB — HEMOGLOBIN A1C: Hgb A1c MFr Bld: 5.9 % (ref 4.6–6.5)

## 2021-01-05 ENCOUNTER — Other Ambulatory Visit: Payer: Self-pay

## 2021-01-06 ENCOUNTER — Ambulatory Visit: Payer: BC Managed Care – PPO | Attending: Internal Medicine

## 2021-01-06 ENCOUNTER — Ambulatory Visit (INDEPENDENT_AMBULATORY_CARE_PROVIDER_SITE_OTHER): Payer: BC Managed Care – PPO | Admitting: Internal Medicine

## 2021-01-06 ENCOUNTER — Encounter: Payer: Self-pay | Admitting: Internal Medicine

## 2021-01-06 VITALS — BP 140/82 | HR 66 | Temp 98.2°F | Resp 16 | Ht 70.0 in | Wt 202.0 lb

## 2021-01-06 DIAGNOSIS — Z0000 Encounter for general adult medical examination without abnormal findings: Secondary | ICD-10-CM | POA: Diagnosis not present

## 2021-01-06 DIAGNOSIS — Z23 Encounter for immunization: Secondary | ICD-10-CM

## 2021-01-06 DIAGNOSIS — E785 Hyperlipidemia, unspecified: Secondary | ICD-10-CM | POA: Diagnosis not present

## 2021-01-06 DIAGNOSIS — R739 Hyperglycemia, unspecified: Secondary | ICD-10-CM

## 2021-01-06 DIAGNOSIS — I10 Essential (primary) hypertension: Secondary | ICD-10-CM | POA: Diagnosis not present

## 2021-01-06 DIAGNOSIS — E559 Vitamin D deficiency, unspecified: Secondary | ICD-10-CM | POA: Diagnosis not present

## 2021-01-06 LAB — LIPID PANEL
Cholesterol: 197 mg/dL (ref 0–200)
HDL: 51.6 mg/dL (ref >39.00)
LDL Cholesterol: 137 mg/dL — ABNORMAL HIGH (ref 0–99)
NonHDL: 145.53
Total CHOL/HDL Ratio: 4
Triglycerides: 43 mg/dL (ref 0.0–149.0)
VLDL: 8.6 mg/dL (ref 0.0–40.0)

## 2021-01-06 LAB — COMPREHENSIVE METABOLIC PANEL
ALT: 21 U/L (ref 0–53)
AST: 18 U/L (ref 0–37)
Albumin: 4.1 g/dL (ref 3.5–5.2)
Alkaline Phosphatase: 53 U/L (ref 39–117)
BUN: 18 mg/dL (ref 6–23)
CO2: 34 mEq/L — ABNORMAL HIGH (ref 19–32)
Calcium: 8.9 mg/dL (ref 8.4–10.5)
Chloride: 102 mEq/L (ref 96–112)
Creatinine, Ser: 1.23 mg/dL (ref 0.40–1.50)
GFR: 66.89 mL/min (ref >60.00)
Glucose, Bld: 101 mg/dL — ABNORMAL HIGH (ref 70–99)
Potassium: 4.5 mEq/L (ref 3.5–5.1)
Sodium: 140 mEq/L (ref 135–145)
Total Bilirubin: 0.9 mg/dL (ref 0.2–1.2)
Total Protein: 6.7 g/dL (ref 6.0–8.3)

## 2021-01-06 LAB — CBC WITH DIFFERENTIAL/PLATELET
Basophils Absolute: 0 10*3/uL (ref 0.0–0.1)
Basophils Relative: 0.4 % (ref 0.0–3.0)
Eosinophils Absolute: 0 10*3/uL (ref 0.0–0.7)
Eosinophils Relative: 0.8 % (ref 0.0–5.0)
HCT: 41.6 % (ref 39.0–52.0)
Hemoglobin: 13.5 g/dL (ref 13.0–17.0)
Lymphocytes Relative: 24.2 % (ref 12.0–46.0)
Lymphs Abs: 1 10*3/uL (ref 0.7–4.0)
MCHC: 32.4 g/dL (ref 30.0–36.0)
MCV: 84 fl (ref 78.0–100.0)
Monocytes Absolute: 0.4 10*3/uL (ref 0.1–1.0)
Monocytes Relative: 9 % (ref 3.0–12.0)
Neutro Abs: 2.8 10*3/uL (ref 1.4–7.7)
Neutrophils Relative %: 65.6 % (ref 43.0–77.0)
Platelets: 252 10*3/uL (ref 150.0–400.0)
RBC: 4.95 Mil/uL (ref 4.22–5.81)
RDW: 15.7 % — ABNORMAL HIGH (ref 11.5–15.5)
WBC: 4.2 10*3/uL (ref 4.0–10.5)

## 2021-01-06 LAB — PSA: PSA: 0.86 ng/mL (ref 0.10–4.00)

## 2021-01-06 LAB — VITAMIN D 25 HYDROXY (VIT D DEFICIENCY, FRACTURES): VITD: 71.06 ng/mL (ref 30.00–100.00)

## 2021-01-06 LAB — HEMOGLOBIN A1C: Hgb A1c MFr Bld: 6.1 % (ref 4.6–6.5)

## 2021-01-06 NOTE — Progress Notes (Signed)
   Covid-19 Vaccination Clinic  Name:  AKSEL BENCOMO    MRN: [id number] DOB: 11/21/1965  01/06/2021  Mr. Wilbert was observed post Covid-19 immunization for 15 minutes without incident. He was provided with Vaccine Information Sheet and instruction to access the V-Safe system.   Mr. Sassone was instructed to call 911 with any severe reactions post vaccine: Difficulty breathing  Swelling of face and throat  A fast heartbeat  A bad rash all over body  Dizziness and weakness   Immunizations Administered     Name Date Dose VIS Date Route   Pfizer Covid-19 Vaccine Bivalent Booster 01/06/2021  9:13 AM 0.3 mL 10/27/2020 Intramuscular   Manufacturer: [place]   Lot: [id number]   [place]: [phone]

## 2021-01-06 NOTE — Patient Instructions (Signed)
Proceed with your COVID-vaccine  Consider the shingles vaccination  Check the  blood twice a month  BP GOAL is between 110/65 and  135/85. If it is consistently higher or lower, let me know   GO TO THE LAB : Get the blood work     [place], [place] back for  a check up in 6 months      "Living will", "[place] of attorney": Advanced care planning  (If you already have a living will or healthcare power of attorney, please bring the copy to be scanned in your chart.)  Advance care planning is a process that supports adults in  understanding and sharing their preferences regarding future medical care.   The patient's preferences are recorded in documents called Advance Directives.    Advanced directives are completed (and can be modified at any time) while the patient is in full mental capacity.   The documentation should be available at all times to the patient, the family and the healthcare providers.  Bring in a copy to be scanned in your chart is an excellent idea and is recommended   This legal documents direct treatment decision making and/or appoint a surrogate to make the decision if the patient is not capable to do so.    Advance directives can be documented in many types of formats,  documents have names such as:  Lliving will  Durable power of attorney for healthcare (healthcare proxy or healthcare power of attorney)  Combined directives  Physician orders for life-sustaining treatment    More information at:  [email or web address]

## 2021-01-06 NOTE — Assessment & Plan Note (Signed)
-  Tdap 2019 - shingrix rec  - PNA (23): 12/28/13  -Covid vaccine  booster rec  - had a flu shot   -CCS- 04/17/02;  cscope 07/2017 + polyp, 5 years -Prostate cancer screening: F had prost Ca age 55,  DRE normal today.  Check PSA diet,exercise: Discussed -Labs:CMP, FLP, CBC, A1c, PSA, vit D - POA d/w pt

## 2021-01-06 NOTE — Progress Notes (Signed)
Subjective:    Patient ID: Gregory Klein, male    DOB: Sep 25, 1965, 55 y.o.   MRN: [id number]  DOS:  01/06/2021 Type of visit - description: cpx  Since the last office visit is feeling well and has no major concerns.  Does have a lump on the right side of the chest.  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 and bilateral decrease in calcaneal inclination angle   DVT (deep venous thrombosis) (HCC)    GERD (gastroesophageal reflux disease)    Hemorrhoids    hx of   Hepatitis B infection    hx of dx 2005 aprox   Hyperglycemia 02/22/2015   Hypertension    Neck pain    local injections x 2, las summer 2011 (Dr.Vortek)   Plantar fasciitis, bilateral 2016   [name], DPM   Sleep apnea    on CPAP     Past Surgical History:  Procedure Laterality Date   INGUINAL HERNIA REPAIR     KNEE ARTHROSCOPY  6-15   Dr Mardelle Matte   KNEE ARTHROSCOPY WITH MEDIAL MENISECTOMY Right 09/26/2018   LEFT HEART CATHETERIZATION WITH CORONARY ANGIOGRAM N/A 12/29/2013   Procedure: LEFT HEART CATHETERIZATION WITH CORONARY ANGIOGRAM;  Surgeon: [name], MD;  Location: [facility] CATH LAB;  Service: Cardiovascular;  Laterality: N/A;   Social History   Socioeconomic History   Marital status: Single    Spouse name: Not on file   Number of children: 0   Years of education: Not on file   Highest education level: Not on file  Occupational History   Occupation: Pension scheme manager: [employer]  Tobacco Use   Smoking status: Never    Smokeless tobacco: Never  Vaping Use   Vaping Use: Never used  Substance and Sexual Activity   Alcohol use: No    Alcohol/week: 0.0 standard drinks   Drug use: No   Sexual activity: Not on file  Other Topics Concern   Not on file  Social History Narrative    Lives by himself   Social Determinants of Health   Financial Resource Strain: Not on file  Food Insecurity: Not on file  Transportation Needs: Not on file  Physical Activity: Not on file  Stress: Not on file  Social Connections: Not on file  Intimate Partner Violence: Not on file     Allergies as of 01/06/2021       Reactions   Amoxicillin Rash        Medication List        Accurate as of January 06, 2021  5:20 PM. If you have any questions, ask your nurse or doctor.          STOP taking these medications    Vitamin D (Ergocalciferol) 1.25 MG (50000 UNIT) Caps capsule Commonly known as: DRISDOL Stopped by: Kathlene November, MD       TAKE these medications  aspirin EC 81 MG tablet Take 81 mg by mouth daily. Swallow whole.   cetirizine 10 MG chewable tablet Commonly known as: ZYRTEC Chew 10 mg by mouth daily.   diclofenac 75 MG EC tablet Commonly known as: VOLTAREN Take 75 mg by mouth 2 (two) times daily.   losartan 50 MG tablet Commonly known as: COZAAR Take 1 tablet (50 mg total) by mouth daily.   OVER THE COUNTER MEDICATION Place 1-2 drops into both eyes as needed (for dry eyes). "[place] for Dry Eyes"   polyethylene glycol 17 g packet Commonly known as: MIRALAX / GLYCOLAX Take 17 g by mouth daily.   pregabalin 75 MG capsule Commonly known as: LYRICA Take 75 mg by mouth 2 (two) times daily.   PROBIOTIC DAILY PO Take by mouth.   psyllium 58.6 % powder Commonly known as: METAMUCIL Take 1 packet by mouth as needed (for constipation). Reported on 05/07/2015   valACYclovir 1000 MG tablet Commonly known as: VALTREX TAKE 2 TABLETS BY MOUTH TWICE DAILY AS NEEDED FOR OUTBREAKS    Vitamin D 50 MCG (2000 UT) tablet Take 2,000 Units by mouth daily.           Objective:   Physical Exam BP 140/82 (BP Location: Left Arm, Patient Position: Sitting, Cuff Size: Normal)   Pulse 66   Temp 98.2 F (36.8 C) (Oral)   Resp 16   Ht 5\' 10"  (1.778 m)   Wt 202 lb (91.6 kg)   SpO2 98%   BMI 28.98 kg/m  General: Well developed, NAD, BMI noted Neck: No  thyromegaly  HEENT:  Normocephalic . Face symmetric, atraumatic Lungs:  CTA B Normal respiratory effort, no intercostal retractions, no accessory muscle use. Heart: RRR,  no murmur.  Abdomen:  Not distended, soft, non-tender. No rebound or rigidity.   Lower extremities: no pretibial edema bilaterally DRE: Normal sphincter tone, brown stools, prostate is difficult to reach but is normal. Skin: R side of the chest has a 1 x 1 cm elevation consistent with a sebaceous cyst, I was able to obtain some fatty material from it. Neurologic:  alert & oriented X3.  Speech normal, gait appropriate for age and unassisted Strength symmetric and appropriate for age.  Psych: Cognition and judgment appear intact.  Cooperative with normal attention span and concentration.  Behavior appropriate. No anxious or depressed appearing.     Assessment     ASSESSMENT Prediabetes A1C 5.9 (12-2014) HTN OSA on CPAP Vitamin D deficiency --DVT: 09/2018: R leg DVT 3 weeks after R knee arthoscopy. 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 Adline Peals), occ numbness R thumb,  Injection 01/2019 Urolithiasis ER 04-30-25 Covid infex 09-2020 while in [place], rx paxlovid   PLAN Here for  CPX Prediabetes: Diet and exercise encouraged.  Checking labs HTN: On losartan, BP today in the 140s.  Goal 120.  Checking labs, monitor BPs, reassess in 6 months Vitamin D deficiency: Have ergocalciferol, now on OTCs.  Labs Radiculopathy: Still an issue, on diclofenac from time to time, was Rx lyrica, he is somewhat leery about the side effects. Sebaceous cyst: reassured ,  call if it changes RTC 6 months   This visit occurred during the SARS-CoV-2 public health emergency.  Safety protocols were in place, including screening questions prior to the visit, additional usage of staff PPE, and extensive cleaning of exam room while observing appropriate contact time as indicated for disinfecting solutions.

## 2021-01-06 NOTE — Assessment & Plan Note (Signed)
Here for CPX Prediabetes: Diet and exercise encouraged.  Checking labs HTN: On losartan, BP today in the 140s.  Goal 120.  Checking labs, monitor BPs, reassess in 6 months Vitamin D deficiency: Have ergocalciferol, now on OTCs.  Labs Radiculopathy: Still an issue, on diclofenac from time to time, was Rx lyrica, he is somewhat leery about the side effects. Sebaceous cyst: reassured , call if it changes RTC 6 months

## 2021-01-25 ENCOUNTER — Other Ambulatory Visit (HOSPITAL_BASED_OUTPATIENT_CLINIC_OR_DEPARTMENT_OTHER): Payer: Self-pay

## 2021-01-25 MED ORDER — PFIZER COVID-19 VAC BIVALENT 30 MCG/0.3ML IM SUSP
INTRAMUSCULAR | 0 refills | Status: DC
  Filled 2021-01-25: qty 0.3, 1d supply, fill #0

## 2021-02-06 ENCOUNTER — Other Ambulatory Visit: Payer: Self-pay | Admitting: Internal Medicine

## 2021-03-18 ENCOUNTER — Other Ambulatory Visit: Payer: Self-pay | Admitting: Physical Medicine and Rehabilitation

## 2021-03-18 DIAGNOSIS — M545 Low back pain, unspecified: Secondary | ICD-10-CM

## 2021-03-29 ENCOUNTER — Ambulatory Visit
Admission: RE | Admit: 2021-03-29 | Discharge: 2021-03-29 | Disposition: A | Discharge status: Home / Self Care | Payer: BC Managed Care – PPO | Source: Ambulatory Visit | Attending: Physical Medicine and Rehabilitation | Admitting: Physical Medicine and Rehabilitation

## 2021-03-29 ENCOUNTER — Other Ambulatory Visit: Payer: Self-pay

## 2021-03-29 DIAGNOSIS — M545 Low back pain, unspecified: Secondary | ICD-10-CM

## 2021-03-29 IMAGING — MR MR LUMBAR SPINE W/O CM
4 of 5 series · 19 of 48 positions shown · non-contrast
Comparison: MR lumbar [DATE]

CLINICAL DATA: Low back pain radiating down left leg.

EXAM:
MRI LUMBAR SPINE WITHOUT CONTRAST
TECHNIQUE: Multiplanar, multisequence MR imaging of the lumbar spine was
performed. No intravenous contrast was administered.

[Series 5: T2 · sagittal · 4.0mm · 0.73mm/px · 6 of 15 slices shown (1 of 2)]
[im 1/15]
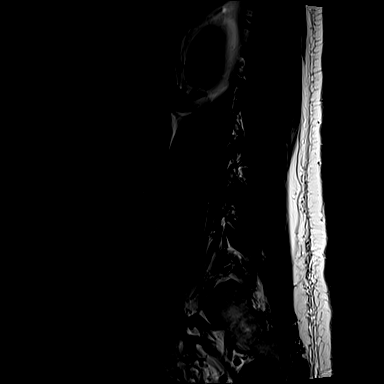
[im 3/15]
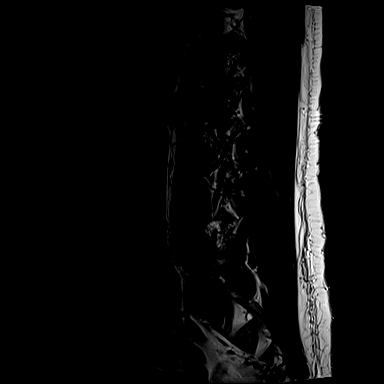
[im 6/15]
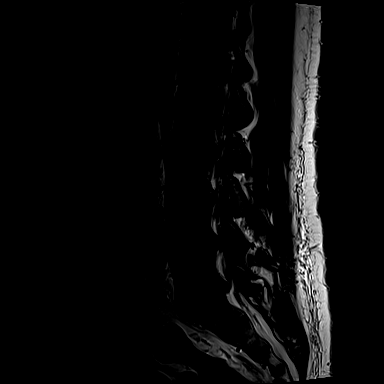
[im 9/15]
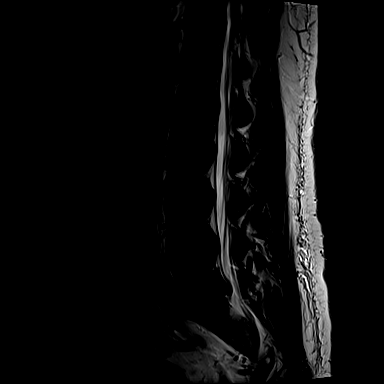
[im 12/15]
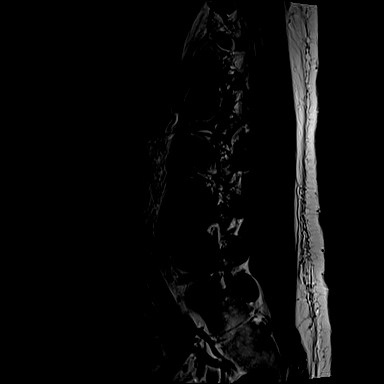
[im 15/15]
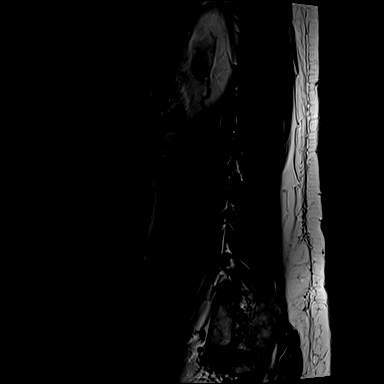

[Series 6: T1 · sagittal · 4.0mm · 0.73mm/px · 3 of 15 slices shown (1 of 2)]
[im 3/15]
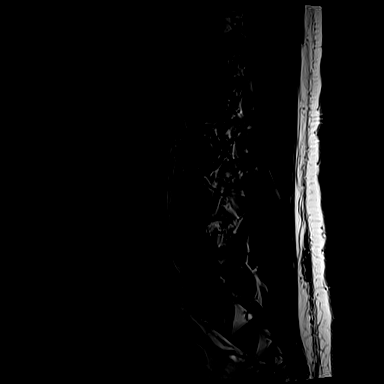
[im 9/15]
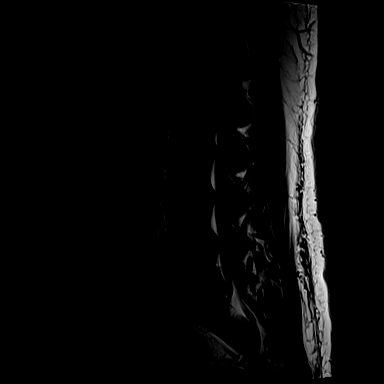
[im 15/15]
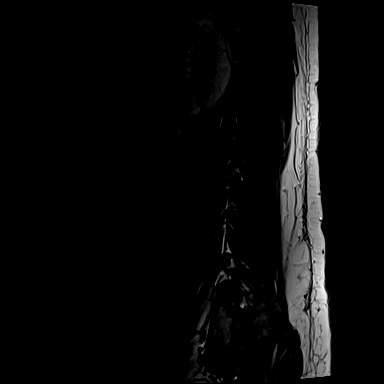

[Series 10: T1 · axial · 4.0mm · 0.28mm/px · z∈[-80,+78]mm · 3 of 40 slices shown (2 of 2)]
[im 6/40]
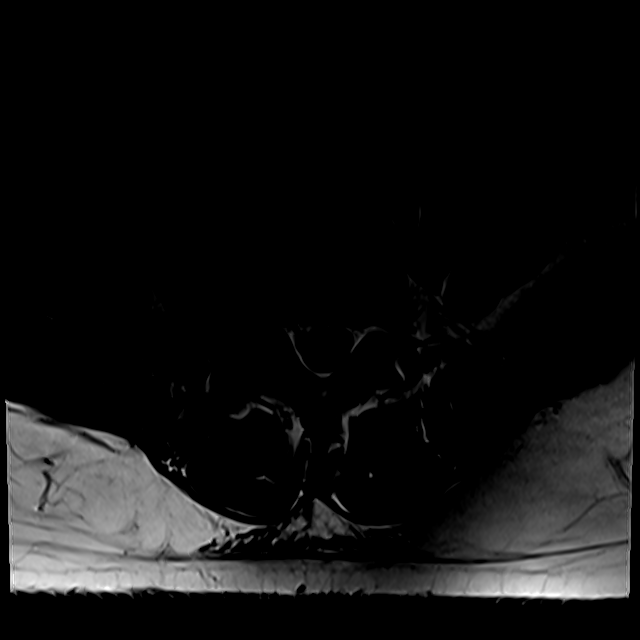
[im 20/40]
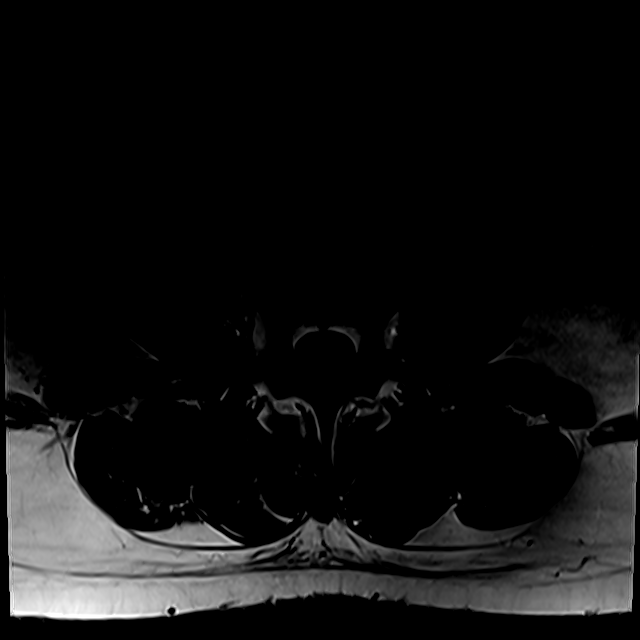
[im 34/40]
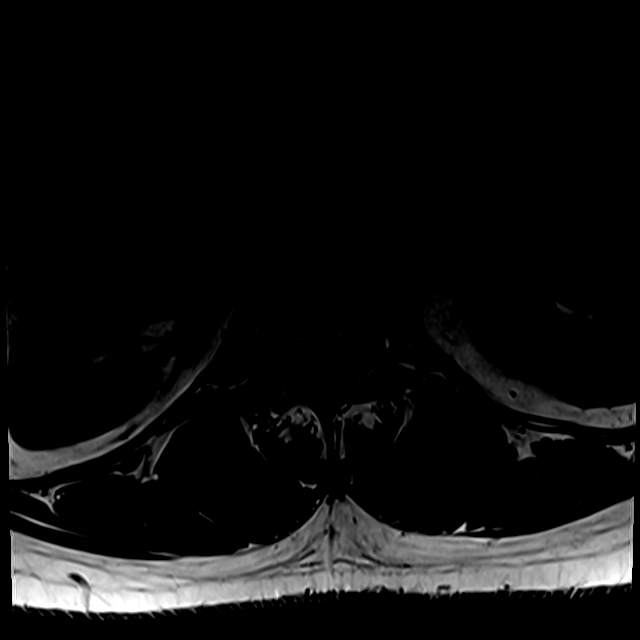

[Series 13: T2 · axial · 4.0mm · 0.28mm/px · z∈[-105,+78]mm · 7 of 40 slices shown (2 of 2)]
[im 1/40]
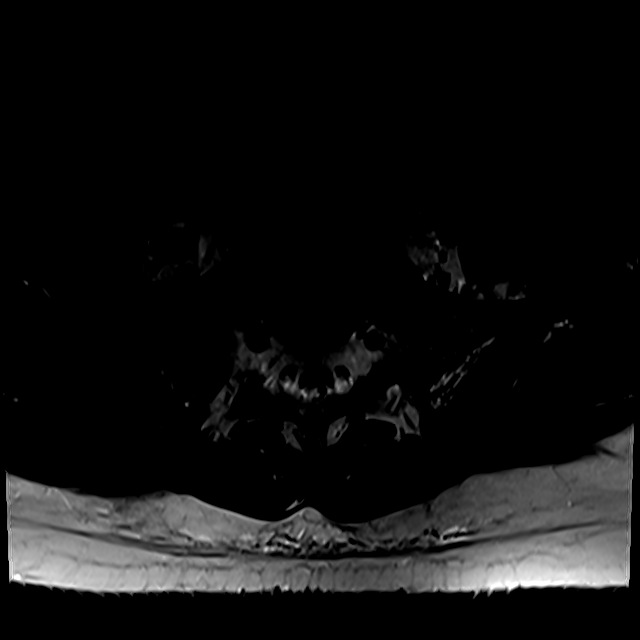
[im 6/40]
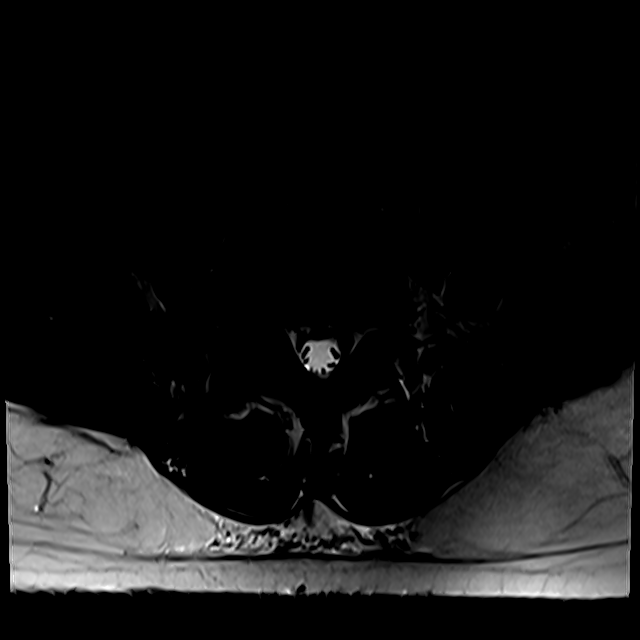
[im 12/40]
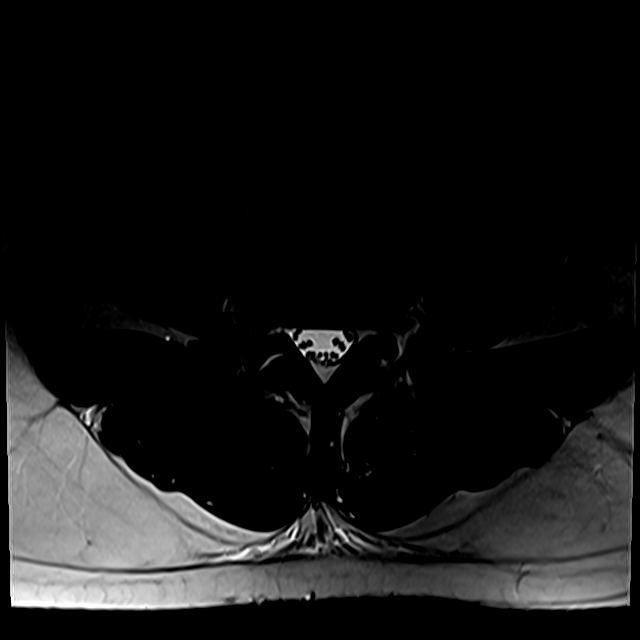
[im 17/40]
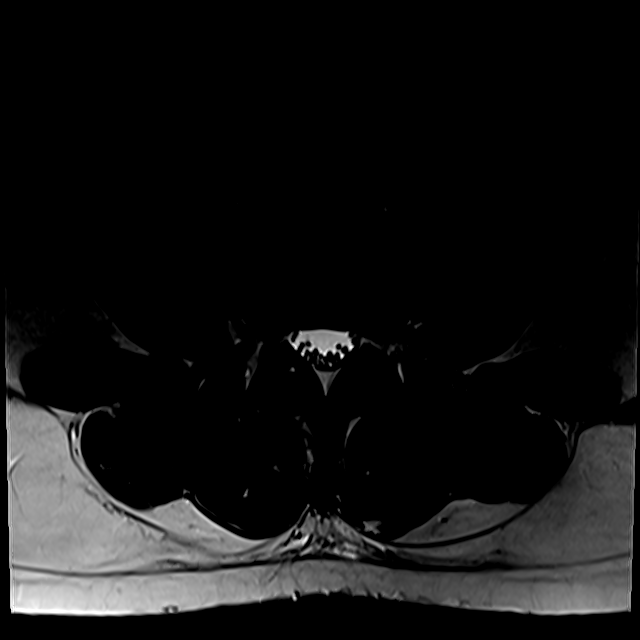
[im 20/40]
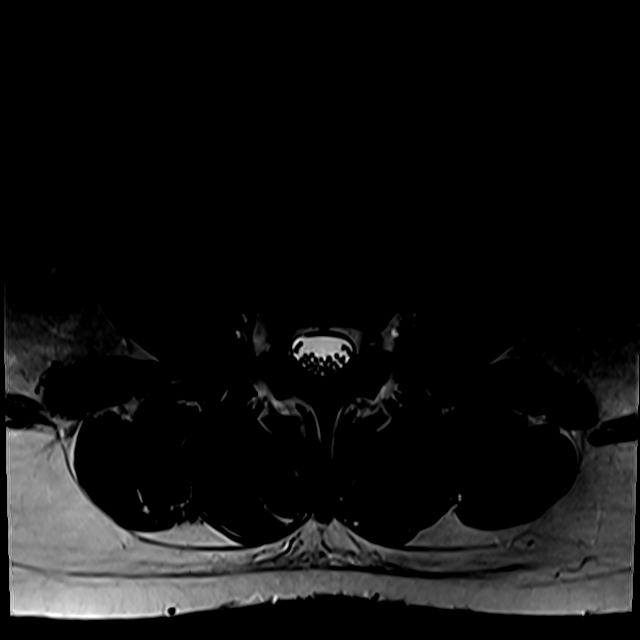
[im 23/40]
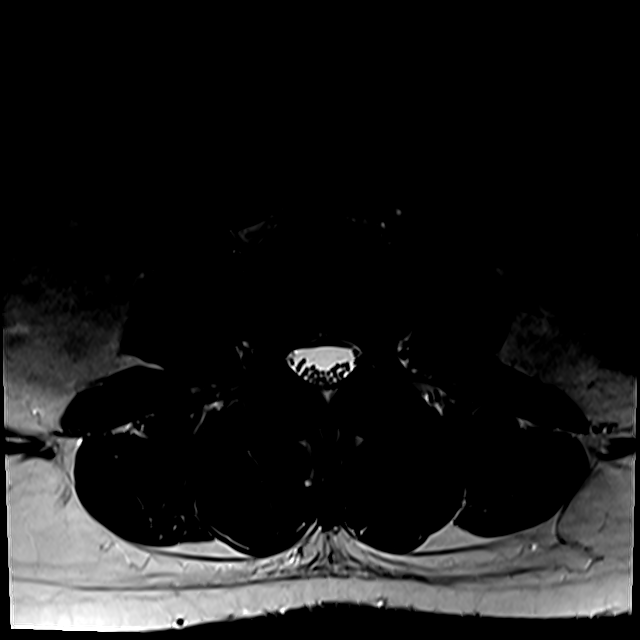
[im 34/40]
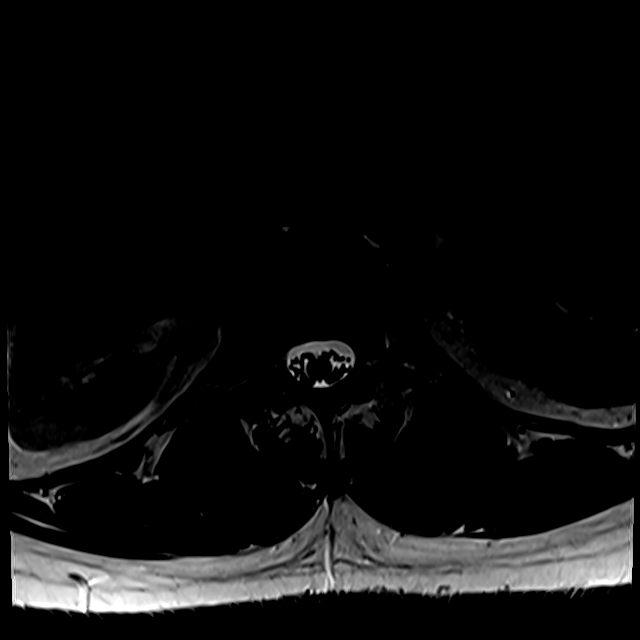

[19 of 48 positions shown; findings below may reference images not displayed]

FINDINGS: Segmentation:  Standard.

Alignment:  Stable including retrolisthesis at L5-S1.

Vertebrae: Stable vertebral body heights. No marrow edema. No
suspicious osseous lesion.

Conus medullaris and cauda equina: Conus extends to the L1-L2 level.
Conus and cauda equina appear normal.

Paraspinal and other soft tissues: Unremarkable

Disc levels:

L1-L2:  No stenosis.

L2-L3:  No stenosis.

L3-L4:  Minimal disc bulge.  No stenosis.

L4-L5:  Minimal disc bulge.  No stenosis.

L5-S1: Disc desiccation and height loss. Disc bulge with new small
superimposed central protrusion. No canal stenosis. Slight
effacement subarticular recesses. Mild right and minor left
foraminal stenosis.
IMPRESSION: Mild degenerative changes as detailed above. No high-grade stenosis.

## 2021-05-04 ENCOUNTER — Other Ambulatory Visit: Payer: Self-pay | Admitting: Internal Medicine

## 2021-05-26 ENCOUNTER — Other Ambulatory Visit: Payer: Self-pay | Admitting: Orthopedic Surgery

## 2021-05-26 DIAGNOSIS — M5451 Vertebrogenic low back pain: Secondary | ICD-10-CM

## 2021-05-30 ENCOUNTER — Telehealth: Payer: Self-pay

## 2021-05-30 NOTE — Telephone Encounter (Signed)
Phone call to patient to discuss order for intradiscal injection. Reviewed pts allergies and most recent weight. Pt was advised not to take oral antibiotics for this procedure that we would give him IV antibiotics prior. Discharge instructions also reviewed with patient and instructed patient to have a driver the day of the procedure. Pt verbalized understanding.  

## 2021-06-07 ENCOUNTER — Ambulatory Visit
Admission: RE | Admit: 2021-06-07 | Discharge: 2021-06-07 | Disposition: A | Discharge status: Home / Self Care | Payer: BC Managed Care – PPO | Source: Ambulatory Visit | Attending: Orthopedic Surgery | Admitting: Orthopedic Surgery

## 2021-06-07 DIAGNOSIS — M5451 Vertebrogenic low back pain: Secondary | ICD-10-CM

## 2021-06-07 MED ORDER — METHYLPREDNISOLONE ACETATE 40 MG/ML INJ SUSP (RADIOLOG
80.0000 mg | Freq: Once | INTRAMUSCULAR | Status: AC
  Administered 2021-06-07: 80 mg via INTRALESIONAL

## 2021-06-07 MED ORDER — VANCOMYCIN HCL IN DEXTROSE 1-5 GM/200ML-% IV SOLN
1000.0000 mg | INTRAVENOUS | Status: AC
  Administered 2021-06-07: 1000 mg via INTRAVENOUS

## 2021-06-07 MED ORDER — IOPAMIDOL (ISOVUE-M 200) INJECTION 41%
1.0000 mL | Freq: Once | INTRAMUSCULAR | Status: AC
  Administered 2021-06-07: 1 mL

## 2021-06-07 NOTE — Progress Notes (Addendum)
Pt was receiving IV Vancomycin pre procedure and started to complain of itching on his scalp. No hives or rash noted. No swelling noted. Pt denied SOB, itchy throat, etc... The infusion was stopped and itching subsided within 5 minutes without any intervention. Around 90% of antibiotic was infused. Dr. Jobe Igo was notified and ordered not to finish the antibiotic. Dr. Jobe Igo also assessed and spoke to the patient prior to procedure.  ?

## 2021-06-07 NOTE — Discharge Instructions (Signed)
Intra-Discal Anesthetic Injection Discharge Instruction Sheet  You may resume a regular diet and any medications that you routinely take (including pain medications).  No driving day of procedure.  Light activity throughout the rest of the day.  Do not do any strenuous work, exercise, bending or lifting.  The day following the procedure, you can resume normal physical activity but you should refrain from exercising or physical therapy for at least three days thereafter.   Please contact our office at [phone] for the following symptoms: Fever greater than 100 degrees. Headaches unresolved with medication after 2-3 days. 

## 2021-07-14 ENCOUNTER — Encounter: Payer: Self-pay | Admitting: Internal Medicine

## 2021-07-14 ENCOUNTER — Ambulatory Visit: Payer: BC Managed Care – PPO | Admitting: Internal Medicine

## 2021-07-14 VITALS — BP 134/80 | HR 82 | Temp 97.9°F | Resp 16 | Ht 70.0 in | Wt 196.4 lb

## 2021-07-14 DIAGNOSIS — R739 Hyperglycemia, unspecified: Secondary | ICD-10-CM | POA: Diagnosis not present

## 2021-07-14 DIAGNOSIS — Z136 Encounter for screening for cardiovascular disorders: Secondary | ICD-10-CM

## 2021-07-14 DIAGNOSIS — I10 Essential (primary) hypertension: Secondary | ICD-10-CM

## 2021-07-14 DIAGNOSIS — Z23 Encounter for immunization: Secondary | ICD-10-CM | POA: Diagnosis not present

## 2021-07-14 NOTE — Patient Instructions (Addendum)
   Continue with your healthy diet  Check the  blood pressure regularly BP GOAL is between 110/65 and  135/85. If it is consistently higher or lower, let me know     [place], [place] back anytime between 2 to 75-month for your second shingles vaccination Come back for   a physical exam in 6 months

## 2021-07-14 NOTE — Progress Notes (Signed)
Subjective:    Patient ID: Gregory Klein, male    DOB: 06-16-1965, 56 y.o.   MRN: [id number]  DOS:  07/14/2021 Type of visit - description: f/u  Today we talk about hypertension, cardiovascular risk. He continue with neck and back pain.    BP Readings from Last 3 Encounters:  07/14/21 134/80  06/07/21 (!) 139/96  01/06/21 140/82     Review of Systems See above   Past Medical History:  Diagnosis Date   Achilles tendonitis, bilateral 2016   [name], DPM   Acute medial meniscus tear of right knee    Acute myocarditis 2015   viral vs d/t strep infection, resolved w/o sequela   Allergy    Deformity of metatarsal    Bilateral, mild DJD at the right 1st MTP joint and bilateral decrease in calcaneal inclination angle   DVT (deep venous thrombosis) (HCC)    GERD (gastroesophageal reflux disease)    Hemorrhoids    hx of   Hepatitis B infection    hx of dx 2005 aprox   Hyperglycemia 02/22/2015   Hypertension    Neck pain    local injections x 2, las summer 2011 (Dr.Vortek)   Plantar fasciitis, bilateral 2016   [name], DPM   Sleep apnea    on CPAP     Past Surgical History:  Procedure Laterality Date   INGUINAL HERNIA REPAIR     KNEE ARTHROSCOPY  6-15   Dr Mardelle Matte   KNEE ARTHROSCOPY WITH MEDIAL MENISECTOMY Right 09/26/2018   LEFT HEART CATHETERIZATION WITH CORONARY ANGIOGRAM N/A 12/29/2013   Procedure: LEFT HEART CATHETERIZATION WITH CORONARY ANGIOGRAM;  Surgeon: [name], MD;  Location: [facility] CATH LAB;  Service: Cardiovascular;  Laterality: N/A;    Current Outpatient Medications  Medication Instructions   aspirin EC 81 mg, Oral, Daily, Swallow whole.    cetirizine (ZYRTEC) 10 mg, Oral, Daily   diclofenac (VOLTAREN) 75 mg, 2 times daily   losartan (COZAAR) 50 MG tablet TAKE 1 TABLET(50 MG) BY MOUTH DAILY   OVER THE COUNTER MEDICATION 1-2 drops, Both Eyes, As needed, "[place] for Dry Eyes"    polyethylene glycol (MIRALAX / GLYCOLAX) 17 g, Oral,  Daily   pregabalin (LYRICA) 75 mg, 2 times daily   Probiotic Product (PROBIOTIC DAILY PO) Oral   psyllium (METAMUCIL) 58.6 % powder 1 packet, Oral, As needed, Reported on 05/07/2015   valACYclovir (VALTREX) 1000 MG tablet TAKE 2 TABLETS BY MOUTH TWICE DAILY AS NEEDED FOR OUTBREAKS   Vitamin D 2,000 Units, Oral, Daily       Objective:   Physical Exam BP 134/80 (BP Location: Left Arm, Patient Position: Sitting, Cuff Size: Normal)   Pulse 82   Temp 97.9 F (36.6 C) (Oral)   Resp 16   Ht '5\' 10"'$  (1.778 m)   Wt 196 lb 6 oz (89.1 kg)   SpO2 98%   BMI 28.18 kg/m  General:   Well developed, NAD, BMI noted. HEENT:  Normocephalic . Face symmetric, atraumatic Lungs:  CTA B Normal respiratory effort, no intercostal retractions, no accessory muscle use. Heart: RRR,  no murmur.  Lower extremities: no pretibial edema bilaterally  Skin: Not pale. Not jaundice Neurologic:  alert & oriented X3.  Speech normal, gait appropriate for age and unassisted Psych--  Cognition and judgment appear intact.  Cooperative with normal attention span and concentration.  Behavior appropriate. No anxious or depressed appearing.      Assessment  ASSESSMENT Prediabetes A1C 5.9 (12-2014) HTN OSA on CPAP Vitamin D deficiency --DVT: 09/2018: R leg DVT 3 weeks after R knee arthoscopy. 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 Adline Peals), occ numbness R thumb,  Injection 01/2019 Urolithiasis ER 04-30-25 Covid infex 09-2020 while in [place], rx paxlovid   PLAN Prediabetes: Last A1c 6.1.  Has improved his diet in the last 2 months, encouraged to  continue the positive changes. HTN: On losartan 50 mg, no ambulatory BPs however reports he goes to other doctors, BP is typically in the 140s.  We had a long discussion about changing his medication but at the end we agreed to continue working on diet and monitoring BPs. Cardiovascular risk: Calculated at 11.7%, we talk about diet, medications, calcium coronary score for a more objective evaluation of his heart.  Elected calcium coronary score.  We will schedule. Neck back pain: Saw Dr. Rolena Infante, had a local injection, did not help much.  Currently seeing a chiropractor (warned against forceful neck manipulation).  Also plans to go back to Dr. Antonietta Jewel Preventive care: Shingrix No. 1 today, come back in few months for second shot. RTC 6 months CPX last

## 2021-07-16 NOTE — Assessment & Plan Note (Signed)
Prediabetes: Last A1c 6.1.  Has improved his diet in the last 2 months, encouraged to continue the positive changes. HTN: On losartan 50 mg, no ambulatory BPs however reports he goes to other doctors, BP is typically in the 140s.  We had a long discussion about changing his medication but at the end we agreed to continue working on diet and monitoring BPs. Cardiovascular risk: Calculated at 11.7%, we talk about diet, medications, calcium coronary score for a more objective evaluation of his heart.  Elected calcium coronary score.  We will schedule. Neck back pain: Saw Dr. Rolena Infante, had a local injection, did not help much.  Currently seeing a chiropractor (warned against forceful neck manipulation).  Also plans to go back to Dr. Antonietta Jewel Preventive care: Shingrix No. 1 today, come back in few months for second shot. RTC 6 months CPX last

## 2021-08-18 ENCOUNTER — Telehealth: Payer: Self-pay

## 2021-08-18 NOTE — Telephone Encounter (Signed)
Pt.notified

## 2021-08-18 NOTE — Telephone Encounter (Signed)
BP Readings:Patient stated his reading have been okay but has seen some increase in readings the past 2 days and was wondering if he should do the dosage increase which was discussed at last visit.    5/28       126/80 5/29         124/74 5/30          135/83 5/31            143/89 6/2           135/84 6/4           129/86 6/5        133/89  146/94 6/9         135/87 6/14      158/99 6/15      122/80 6/16    130/89 6/21  168/117( home monitor) ,, 152/89( patient went to [facility] ) 6/22   149/90

## 2021-09-05 ENCOUNTER — Inpatient Hospital Stay: Admission: RE | Admit: 2021-09-05 | Payer: BC Managed Care – PPO | Source: Ambulatory Visit

## 2021-09-20 ENCOUNTER — Ambulatory Visit (HOSPITAL_BASED_OUTPATIENT_CLINIC_OR_DEPARTMENT_OTHER)
Admission: RE | Admit: 2021-09-20 | Discharge: 2021-09-20 | Disposition: A | Discharge status: Home / Self Care | Payer: BC Managed Care – PPO | Source: Ambulatory Visit | Attending: Internal Medicine | Admitting: Internal Medicine

## 2021-09-20 ENCOUNTER — Other Ambulatory Visit (HOSPITAL_BASED_OUTPATIENT_CLINIC_OR_DEPARTMENT_OTHER): Payer: Self-pay | Admitting: Family Medicine

## 2021-09-20 ENCOUNTER — Ambulatory Visit (INDEPENDENT_AMBULATORY_CARE_PROVIDER_SITE_OTHER): Payer: BC Managed Care – PPO

## 2021-09-20 DIAGNOSIS — Z23 Encounter for immunization: Secondary | ICD-10-CM | POA: Diagnosis not present

## 2021-09-20 DIAGNOSIS — Z136 Encounter for screening for cardiovascular disorders: Secondary | ICD-10-CM | POA: Insufficient documentation

## 2021-09-20 NOTE — Progress Notes (Signed)
56 yr old male is here to today for 2nd shingles vaccine. Pt was given 0.5 mL of shnigrix in right deltoid. Pt tolerated well

## 2021-09-22 ENCOUNTER — Telehealth: Payer: Self-pay | Admitting: Internal Medicine

## 2021-09-22 NOTE — Telephone Encounter (Signed)
Called  to discuss coronary calcium score results, unable to leave a message.  We will send a message

## 2021-09-30 ENCOUNTER — Telehealth: Payer: Self-pay | Admitting: Internal Medicine

## 2021-09-30 DIAGNOSIS — I7789 Other specified disorders of arteries and arterioles: Secondary | ICD-10-CM

## 2021-09-30 NOTE — Telephone Encounter (Signed)
Refer to Dr. [name], Dx enlarged aorta. === Patient called back, we went over the results of his calcium coronary score. He is concerned about the enlarged aorta. I explained him to the best of my ability what that means. He pointed out that his BP is in the 140s in the ambulatory setting and (correctly) would like that to be better. Request referral to Dr. Oval Linsey, for another opinion, will arrange. I offered him to up his BP medication but he prefers to discuss with cardiology

## 2021-09-30 NOTE — Telephone Encounter (Signed)
Referral placed.

## 2021-10-05 ENCOUNTER — Other Ambulatory Visit: Payer: Self-pay | Admitting: Physical Medicine and Rehabilitation

## 2021-10-05 DIAGNOSIS — M542 Cervicalgia: Secondary | ICD-10-CM

## 2021-10-11 ENCOUNTER — Ambulatory Visit
Admission: RE | Admit: 2021-10-11 | Discharge: 2021-10-11 | Disposition: A | Discharge status: Home / Self Care | Payer: BC Managed Care – PPO | Source: Ambulatory Visit | Attending: Physical Medicine and Rehabilitation | Admitting: Physical Medicine and Rehabilitation

## 2021-10-11 DIAGNOSIS — M542 Cervicalgia: Secondary | ICD-10-CM

## 2021-10-15 ENCOUNTER — Other Ambulatory Visit: Payer: BC Managed Care – PPO

## 2021-11-01 NOTE — Progress Notes (Signed)
Cardiology Office Note:    Date:  11/02/2021   ID:  Gregory Klein, DOB June 02, 1965, MRN [id number]  PCP:  Colon Branch, MD   [facility] Providers Cardiologist:  None     Referring MD: Colon Branch, MD   No chief complaint on file.   History of Present Illness:    Gregory Klein is a 56 y.o. male with a hx of acute myocarditis, DVT, hypertension, prediabetes, GERD, and sleep apnea on CPAP, here for the evaluation of enlarged aorta at the request of Dr. [name]. He saw his PCP 07/14/2021 and his A1c was 6.1 with dietary changes. His blood pressure was typically in the 140's on losartan 50 mg. His cardiovascular risk was calculated at 11.7%, and he agreed to proceed with elected calcium score for a more objective evaluation of his heart. He had a calcium score 09/20/2021 that revealed a coronary calcium score of 0. There was also aneurysmal dilatation of the ascending thoracic aorta (4.0 cm). Annual follow-up imaging was recommended. After discussion with his PCP, he also noted that his BP had remained in the 140's. He preferred to maintain his regimen until consulting with cardiology. He had an Echo in 2015 with LVEF 50-55% and grade 1 diastolic dysfunction. His aortic root was 4.0 cm at that time.  Today, he reports having hypertension since he was in his 56's. In the past few months his blood pressure has been higher in the 140's. He notes that his blood pressure is higher today than he thought it would be. He usually checks with the machine at [facility] and it will be around 140/85, up to 90 diastolic. In clinic today it is 154/100 (144/92 on recheck). Over the weekend he has been grieving the loss of a friend as well. Also for the past few years he has been struggling with lower back issues and sciatic nerve pain radiating to his LLE. He also reports inflammation of his neck due to a herniated disc, for which he sees a chiropractor and physical therapy. Lately his pain has been limiting his formal  exercise. When walking from the car to a building he may develop leg pain. He used to do cardio exercises and walk around the track more often. When exercising he denies any anginal symptoms. Regarding his diet, he orders out frequently. In the past few months he has tried prepping his meals which is helping. He stopped using table salt when he was initially started on antihypertensives. Usually he doesn't drink coffee, but he will drink sodas if he goes to a fast food restaurant. Occasionally he may have 1-2 beers, but this is rare. He endorses snoring and sleep apnea. He admits to not using his CPAP as much as he should; not every night. He does tolerate his CPAP, but he often will just climb into bed without it due to laziness, or he is falling asleep in the chair. He denies any palpitations, chest pain, shortness of breath, or peripheral edema. No lightheadedness, headaches, syncope, orthopnea, or PND.  Past Medical History:  Diagnosis Date   Achilles tendonitis, bilateral 2016   [name], DPM   Acute medial meniscus tear of right knee    Acute myocarditis 2015   viral vs d/t strep infection, resolved w/o sequela   Allergy    Ascending aortic aneurysm ([place]) 11/02/2021   Deformity of metatarsal    Bilateral, mild DJD at the right 1st MTP joint and bilateral decrease in calcaneal inclination angle  DVT (deep venous thrombosis) (HCC)    GERD (gastroesophageal reflux disease)    Hemorrhoids    hx of   Hepatitis B infection    hx of dx 2005 aprox   Hyperglycemia 02/22/2015   Hypertension    Neck pain    local injections x 2, las summer 2011 (Dr.Vortek)   Plantar fasciitis, bilateral 2016   [name], DPM   Sleep apnea    on CPAP     Past Surgical History:  Procedure Laterality Date   INGUINAL HERNIA REPAIR     KNEE ARTHROSCOPY  6-15   Dr Mardelle Matte   KNEE ARTHROSCOPY WITH MEDIAL MENISECTOMY Right 09/26/2018   LEFT HEART CATHETERIZATION WITH CORONARY ANGIOGRAM N/A 12/29/2013    Procedure: LEFT HEART CATHETERIZATION WITH CORONARY ANGIOGRAM;  Surgeon: [name], MD;  Location: [facility] CATH LAB;  Service: Cardiovascular;  Laterality: N/A;    Current Medications: Current Meds  Medication Sig   aspirin EC 81 MG tablet Take 81 mg by mouth daily. Swallow whole.   carvedilol (COREG) 12.5 MG tablet Take 1 tablet (12.5 mg total) by mouth 2 (two) times daily.   cetirizine (ZYRTEC) 10 MG chewable tablet Chew 10 mg by mouth daily.   Cholecalciferol (VITAMIN D) 50 MCG (2000 UT) tablet Take 2,000 Units by mouth daily.   losartan (COZAAR) 50 MG tablet TAKE 1 TABLET(50 MG) BY MOUTH DAILY   OVER THE COUNTER MEDICATION Place 1-2 drops into both eyes as needed (for dry eyes). "[place] for Dry Eyes"   polyethylene glycol (MIRALAX / GLYCOLAX) packet Take 17 g by mouth daily.   Probiotic Product (PROBIOTIC DAILY PO) Take by mouth.   psyllium (METAMUCIL) 58.6 % powder Take 1 packet by mouth as needed (for constipation). Reported on 05/07/2015   valACYclovir (VALTREX) 1000 MG tablet TAKE 2 TABLETS BY MOUTH TWICE DAILY AS NEEDED FOR OUTBREAKS     Allergies:   Amoxicillin and Vancomycin   Social History   Socioeconomic History   Marital status: Single    Spouse name: Not on file   Number of children: 0   Years of education: Not on file   Highest education level: Not on file  Occupational History   Occupation: Pension scheme manager: [employer]  Tobacco Use   Smoking status: Never   Smokeless tobacco: Never  Vaping Use   Vaping Use: Never used  Substance and Sexual Activity   Alcohol use: No    Alcohol/week: 0.0 standard drinks of alcohol   Drug use: No   Sexual activity: Not on file  Other Topics Concern   Not on file  Social History Narrative    Lives by himself   Social Determinants of Health   Financial Resource Strain: Not on file  Food Insecurity: Not on file  Transportation Needs: Not on file  Physical Activity: Not on file  Stress: Not  on file  Social Connections: Not on file     Family History: The patient's family history includes Breast cancer in his paternal grandmother; Coronary artery disease in his maternal grandmother; Diabetes in his paternal grandfather; Heart attack in his maternal grandmother; Hypertension in his father, maternal grandmother, and mother; Kidney Stones in his father; Pancreatic cancer in his paternal aunt; Prostate cancer (age of onset: 45) in his father; Prostate cancer (age of onset: 77) in his maternal grandfather; Stroke in his mother. There is no history of Colon cancer, Esophageal cancer, Kidney disease, Liver cancer, Rectal cancer, or  Stomach cancer.  ROS:   Please see the history of present illness.    (+) Stress (+) Lower back pain (+) LLE sciatic pain (+) Neck pain (+) Snoring All other systems reviewed and are negative.  EKGs/Labs/Other Studies Reviewed:    The following studies were reviewed today:  Coronary CT  09/20/2021: IMPRESSION: Coronary calcium score of 0 Agatston units. This suggests low risk for future cardiac events.  OVER-READ INTERPRETATION  CT CHEST FINDINGS: Aneurysmal dilatation ascending thoracic aorta 4.0 cm transverse image 10. Heart size upper normal. No pericardial effusion.   Esophagus unremarkable. Visualized upper abdomen unremarkable. No thoracic adenopathy.   5 mm focus of nodular thickening at RIGHT major fissure image 12. 5 mm focus of nodular pleural thickening LEFT major fissure image 14. Minimal subsegmental atelectasis LEFT lower lobe. Remaining lungs clear. No acute osseous findings.   IMPRESSION: Aneurysmal dilatation ascending thoracic aorta 4.0 cm transverse; Recommend annual imaging followup by CTA or MRA. This recommendation follows 2010 ACCF/AHA/AATS/ACR/ASA/SCA/SCAI/SIR/STS/SVM Guidelines for the Diagnosis and Management of Patients with Thoracic Aortic Disease. Circulation. 2010; 121: Y865-H846. Aortic aneurysm  NOS (ICD10-I71.9)   Nodular foci of pleural thickening at the major fissures bilaterally; no routine follow-up recommended.   EKG:  EKG is personally reviewed. 11/02/2021: Sinus rhythm. Rate 72 bpm. LVH.  Recent Labs: 01/06/2021: ALT 21; BUN 18; Creatinine, Ser 1.23; Hemoglobin 13.5; Platelets 252.0; Potassium 4.5; Sodium 140   Recent Lipid Panel    Component Value Date/Time   CHOL 197 01/06/2021 0852   CHOL 175 04/23/2019 0919   TRIG 43.0 01/06/2021 0852   HDL 51.60 01/06/2021 0852   HDL 56 04/23/2019 0919   CHOLHDL 4 01/06/2021 0852   VLDL 8.6 01/06/2021 0852   LDLCALC 137 (H) 01/06/2021 0852   LDLCALC 112 (H) 04/23/2019 0919   LDLDIRECT 135.8 03/29/2011 1043     Risk Assessment/Calculations:           Physical Exam:    Wt Readings from Last 3 Encounters:  11/02/21 196 lb 12.8 oz (89.3 kg)  07/14/21 196 lb 6 oz (89.1 kg)  01/06/21 202 lb (91.6 kg)     VS:  BP (!) 144/92 (BP Location: Right Arm, Patient Position: Sitting, Cuff Size: Normal)   Pulse 72   Ht '5\' 11"'$  (1.803 m)   Wt 196 lb 12.8 oz (89.3 kg)   BMI 27.45 kg/m  , BMI Body mass index is 27.45 kg/m. GENERAL:  Well appearing HEENT: Pupils equal round and reactive, fundi not visualized, oral mucosa unremarkable NECK:  No jugular venous distention, waveform within normal limits, carotid upstroke brisk and symmetric, no bruits, no thyromegaly LUNGS:  Clear to auscultation bilaterally HEART:  RRR.  PMI not displaced or sustained,S1 and S2 within normal limits, no S3, no S4, no clicks, no rubs, no murmurs ABD:  Flat, positive bowel sounds normal in frequency in pitch, no bruits, no rebound, no guarding, no midline pulsatile mass, no hepatomegaly, no splenomegaly EXT:  2 plus pulses throughout, no edema, no cyanosis no clubbing SKIN:  No rashes no nodules NEURO:  Cranial nerves II through XII grossly intact, motor grossly intact throughout PSYCH:  Cognitively intact, oriented to person place and  time   ASSESSMENT:    1. Primary hypertension   2. OSA on CPAP   3. Aneurysm of ascending aorta without rupture (HCC)    PLAN:    Hypertension Blood pressure is poorly controlled.  He also has a mild aortic aneurysm.  Therefore we will start carvedilol  12.5 mg twice daily.  Continue losartan.  We may need to increase his losartan or switch to a more potent ARB at follow-up or calcium channel blocker/diuret if he remains uncontrolled at follow-up.  He was given an advance hypertension clinic booklet and asked to check his blood pressures twice daily.  He will meet with our pharmacist in 1 month.  He will work on increasing his exercise and we will refer him to water physical therapy downstairs and schedule.  Limit sodium to 2300 mg daily.  Continue to limit caffeine and cook more at home.  OSA on CPAP He was encouraged to use his CPAP more faithfully.  Ascending aortic aneurysm (HCC) 4.0 centimeters on coronary calcium score.  This is stable from his echo in 2015.  Blood pressure control and beta-blocker as above.  We will repeat his echo in a year.        Disposition: FU with PharmD in 1 month. FU with Oluwaseyi Tull C. Oval Linsey, MD, [facility] in 6 months.  Medication Adjustments/Labs and Tests Ordered: Current medicines are reviewed at length with the patient today.  Concerns regarding medicines are outlined above.   Orders Placed This Encounter  Procedures   AMB Referral to [facility] Pharm-D   Ambulatory referral to Physical Therapy   PT whirlpool therapy   EKG 12-Lead   Meds ordered this encounter  Medications   carvedilol (COREG) 12.5 MG tablet    Sig: Take 1 tablet (12.5 mg total) by mouth 2 (two) times daily.    Dispense:  180 tablet    Refill:  3   Patient Instructions  Medication Instructions:  START CARVEDILOL 12.5 MG TWICE A DAY   *If you need a refill on your cardiac medications before your next appointment, please call your pharmacy*  Lab  Work: NONE  Testing/Procedures: NONE  Follow-Up: At [facility], you and your health needs are our priority.  As part of our continuing mission to provide you with exceptional heart care, we have created designated Provider Care Teams.  These Care Teams include your primary Cardiologist (physician) and Advanced Practice Providers (APPs -  Physician Assistants and Nurse Practitioners) who all work together to provide you with the care you need, when you need it.  We recommend signing up for the patient portal called "MyChart".  Sign up information is provided on this After Visit Summary.  MyChart is used to connect with patients for Virtual Visits (Telemedicine).  Patients are able to view lab/test results, encounter notes, upcoming appointments, etc.  Non-urgent messages can be sent to your provider as well.   To learn more about what you can do with MyChart, go to [email or web address].    Your next appointment:   6 month(s)  The format for your next appointment:   In Person  Provider:   [name], MD    PHARM D IN 1  MONTH  Other Instructions  MONITOR YOUR BLOOD PRESSURE TWICE A DAY, LOG IN THE BOOK PROVIDED. BRING THE BOOK AND YOUR BLOOD PRESSURE MACHINE TO YOUR FOLLOW UP IN 1 MONTH   YOU HAVE BEEN REFERRED TO WATER THERAPY DOWNSTAIRS, PLEASE CALL THE OFFICE AT [phone] IF YOU DO NOT HEAR IN 2 WEEKS        I,Mathew Stumpf,acting as a scribe for [name], MD.,have documented all relevant documentation on the behalf of [name], MD,as directed by  [name], MD while in the presence of [name], MD.  I, Benson Oval Linsey, MD have reviewed all  documentation for this visit.  The documentation of the exam, diagnosis, procedures, and orders on 11/02/2021 are all accurate and complete.   Signed, [name], MD  11/02/2021 12:48 PM     [facility]

## 2021-11-02 ENCOUNTER — Encounter (HOSPITAL_BASED_OUTPATIENT_CLINIC_OR_DEPARTMENT_OTHER): Payer: Self-pay | Admitting: Cardiovascular Disease

## 2021-11-02 ENCOUNTER — Ambulatory Visit (INDEPENDENT_AMBULATORY_CARE_PROVIDER_SITE_OTHER): Payer: BC Managed Care – PPO | Admitting: Cardiovascular Disease

## 2021-11-02 ENCOUNTER — Other Ambulatory Visit: Payer: Self-pay | Admitting: Internal Medicine

## 2021-11-02 DIAGNOSIS — G4733 Obstructive sleep apnea (adult) (pediatric): Secondary | ICD-10-CM

## 2021-11-02 DIAGNOSIS — Z9989 Dependence on other enabling machines and devices: Secondary | ICD-10-CM | POA: Diagnosis not present

## 2021-11-02 DIAGNOSIS — I10 Essential (primary) hypertension: Secondary | ICD-10-CM

## 2021-11-02 DIAGNOSIS — I7121 Aneurysm of the ascending aorta, without rupture: Secondary | ICD-10-CM

## 2021-11-02 HISTORY — DX: Aneurysm of the ascending aorta, without rupture: I71.21

## 2021-11-02 MED ORDER — CARVEDILOL 12.5 MG PO TABS
12.5000 mg | ORAL_TABLET | Freq: Two times a day (BID) | ORAL | 3 refills | Status: DC
Start: 2021-11-02 — End: 2022-10-27

## 2021-11-02 NOTE — Patient Instructions (Addendum)
Medication Instructions:  START CARVEDILOL 12.5 MG TWICE A DAY   *If you need a refill on your cardiac medications before your next appointment, please call your pharmacy*  Lab Work: NONE  Testing/Procedures: NONE  Follow-Up: At [facility], you and your health needs are our priority.  As part of our continuing mission to provide you with exceptional heart care, we have created designated Provider Care Teams.  These Care Teams include your primary Cardiologist (physician) and Advanced Practice Providers (APPs -  Physician Assistants and Nurse Practitioners) who all work together to provide you with the care you need, when you need it.  We recommend signing up for the patient portal called "MyChart".  Sign up information is provided on this After Visit Summary.  MyChart is used to connect with patients for Virtual Visits (Telemedicine).  Patients are able to view lab/test results, encounter notes, upcoming appointments, etc.  Non-urgent messages can be sent to your provider as well.   To learn more about what you can do with MyChart, go to [email or web address].    Your next appointment:   6 month(s)  The format for your next appointment:   In Person  Provider:   [name], MD    PHARM D IN 1  MONTH  Other Instructions  MONITOR YOUR BLOOD PRESSURE TWICE A DAY, LOG IN THE BOOK PROVIDED. BRING THE BOOK AND YOUR BLOOD PRESSURE MACHINE TO YOUR FOLLOW UP IN 1 MONTH   YOU HAVE BEEN REFERRED TO WATER THERAPY DOWNSTAIRS, PLEASE CALL THE OFFICE AT [phone] IF YOU DO NOT HEAR IN 2 WEEKS

## 2021-11-02 NOTE — Assessment & Plan Note (Signed)
4.0 centimeters on coronary calcium score.  This is stable from his echo in 2015.  Blood pressure control and beta-blocker as above.  We will repeat his echo in a year.

## 2021-11-02 NOTE — Assessment & Plan Note (Signed)
He was encouraged to use his CPAP more faithfully.

## 2021-11-02 NOTE — Assessment & Plan Note (Addendum)
Blood pressure is poorly controlled.  He also has a mild aortic aneurysm.  Therefore we will start carvedilol 12.5 mg twice daily.  Continue losartan.  We may need to increase his losartan or switch to a more potent ARB at follow-up or calcium channel blocker/diuret if he remains uncontrolled at follow-up.  He was given an advance hypertension clinic booklet and asked to check his blood pressures twice daily.  He will meet with our pharmacist in 1 month.  He will work on increasing his exercise and we will refer him to water physical therapy downstairs and schedule.  Limit sodium to 2300 mg daily.  Continue to limit caffeine and cook more at home.

## 2021-11-25 ENCOUNTER — Encounter (HOSPITAL_BASED_OUTPATIENT_CLINIC_OR_DEPARTMENT_OTHER): Payer: Self-pay | Admitting: Physical Therapy

## 2021-11-25 ENCOUNTER — Ambulatory Visit (HOSPITAL_BASED_OUTPATIENT_CLINIC_OR_DEPARTMENT_OTHER): Payer: BC Managed Care – PPO | Attending: Cardiovascular Disease | Admitting: Physical Therapy

## 2021-11-25 DIAGNOSIS — M5432 Sciatica, left side: Secondary | ICD-10-CM

## 2021-11-25 DIAGNOSIS — M6281 Muscle weakness (generalized): Secondary | ICD-10-CM | POA: Diagnosis present

## 2021-11-25 DIAGNOSIS — M5412 Radiculopathy, cervical region: Secondary | ICD-10-CM | POA: Insufficient documentation

## 2021-11-25 DIAGNOSIS — I10 Essential (primary) hypertension: Secondary | ICD-10-CM | POA: Insufficient documentation

## 2021-11-25 DIAGNOSIS — M542 Cervicalgia: Secondary | ICD-10-CM | POA: Diagnosis present

## 2021-11-25 DIAGNOSIS — I7121 Aneurysm of the ascending aorta, without rupture: Secondary | ICD-10-CM | POA: Insufficient documentation

## 2021-11-25 NOTE — Therapy (Signed)
OUTPATIENT PHYSICAL THERAPY THORACOLUMBAR EVALUATION   Patient Name: Gregory Klein MRN: [id number] DOB:1965/07/22, 56 y.o., male Today's Date: 11/25/2021   PT End of Session - 11/25/21 1313     Visit Number 1    Number of Visits 16    Date for PT Re-Evaluation 01/20/22    Authorization Type BC/BS    PT Start Time 0815    PT Stop Time 0900    PT Time Calculation (min) 45 min    Activity Tolerance Patient tolerated treatment well    Behavior During Therapy [facility] for tasks assessed/performed             Past Medical History:  Diagnosis Date   Achilles tendonitis, bilateral 2016   [name], DPM   Acute medial meniscus tear of right knee    Acute myocarditis 2015   viral vs d/t strep infection, resolved w/o sequela   Allergy    Ascending aortic aneurysm ([place]) 11/02/2021   Deformity of metatarsal    Bilateral, mild DJD at the right 1st MTP joint and bilateral decrease in calcaneal inclination angle   DVT (deep venous thrombosis) ([place])    GERD (gastroesophageal reflux disease)    Hemorrhoids    hx of   Hepatitis B infection    hx of dx 2005 aprox   Hyperglycemia 02/22/2015   Hypertension    Neck pain    local injections x 2, las summer 2011 (Dr.Vortek)   Plantar fasciitis, bilateral 2016   [name], DPM   Sleep apnea    on CPAP    Past Surgical History:  Procedure Laterality Date   INGUINAL HERNIA REPAIR     KNEE ARTHROSCOPY  6-15   Dr Mardelle Matte   KNEE ARTHROSCOPY WITH MEDIAL MENISECTOMY Right 09/26/2018   LEFT HEART CATHETERIZATION WITH CORONARY ANGIOGRAM N/A 12/29/2013   Procedure: LEFT HEART CATHETERIZATION WITH CORONARY ANGIOGRAM;  Surgeon: [name], MD;  Location: [facility] CATH LAB;  Service: Cardiovascular;  Laterality: N/A;   Patient Active Problem List   Diagnosis Date Noted   Ascending aortic aneurysm ([place]) 11/02/2021   Hyperglycemia 02/22/2015   PCP NOTES >>> 12/28/2014   OSA on CPAP 07/21/2014   Myocarditis ([place])    Hypertension    GERD  (gastroesophageal reflux disease)    Hepatitis B infection    Neck pain--Chronic 11/10/2013   Annual physical exam 03/29/2011    PCP: Colon Branch, MD  REFERRING PROVIDER: [name], MD  REFERRING DIAG:  I10 (ICD-10-CM) - Primary hypertension  I71.21 (ICD-10-CM) - Aneurysm of ascending aorta without rupture [facility])    Rationale for Evaluation and Treatment Rehabilitation  THERAPY DIAG:  Sciatica, left side  Muscle weakness (generalized)  Cervicalgia  Radiculopathy, cervical region  ONSET DATE: 2 yrs  SUBJECTIVE:  SUBJECTIVE STATEMENT: Left buttock pain with radicular sx LLE. Pain originated in right leg then switched to left, radiating pins and needles daily from butt cheek to foot . P&N occasionally when waking/sometimes in day comes and goes and sometimes there upon waking. Neck pain moves into left shoulder and has gone down into my hand.  Pt reports he has been going to [facility] PT for a little while referred over there for his cervical and shoulder issues  but admits not being consistent and not completing HEP. He has not seen any positive results.  Pain started about 2 years ago and has progressively gotten worse.  Was referred over to Dr D. Brooks for assessment on possible surgical intervention PERTINENT HISTORY:  56 y.o. male with a hx of acute myocarditis, DVT, hypertension, prediabetes, GERD, and sleep apnea on CPAP,  enlarged aorta .  Low back with sciatica pain and cervical neck pain.  Cervical stenosis;  KNEE ARTHROSCOPY WITH MEDIAL MENISECTOMY Right 09/26/2018     PAIN:  Are you having pain? Yes: NPRS scale: current 3/10 worst 8/1/0; least 0/10 Pain location: left buttock to foot Pain description: pins and needles, ache; radiating Aggravating factors: standing with  AD"s Relieving factors: lying down/sleeping   PRECAUTIONS: Cervical  WEIGHT BEARING RESTRICTIONS No  FALLS:  Has patient fallen in last 6 months? 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 Mild progression of central disc protrusion C3-4. Mild spinal stenosis and mild foraminal stenosis bilaterally similar to the prior study 2. Moderate spinal stenosis with moderate foraminal stenosis bilaterally at C4-5 and C5-6 unchanged 3. Mild foraminal narrowing bilaterally C6-7 unchanged. 03/29/21 Lumbar spine MRI: Mild degenerative changes as detailed above  PATIENT SURVEYS:  FOTO 47 with goal of 55  SCREENING FOR RED FLAGS:  Neg COGNITION:  Overall cognitive status: Within functional limits for tasks assessed     SENSATION: Paresthesias LLE daily, occasionally lue   MUSCLE LENGTH: Hamstrings: Right 80 deg; Left 70 deg Thomas test: neg  POSTURE: rounded shoulders and forward head  PALPATION: No tenderness to palpation  LUMBAR ROM:  WFL  UE ROM/STRENGTH ROM: Left shoulder; elbow wfl STR: Left shoulder ~4-/5 (plan to measure using dynamometer @ land based appointment)     LOWER EXTREMITY ROM:     Active  Right eval Left eval  Hip flexion 80 70 in supine  Hip extension 10 5d  Hip abduction    Hip adduction    Hip internal rotation wfl wfl  Hip external rotation Limited 25% Limited 50%  Knee flexion    Knee extension    Ankle dorsiflexion    Ankle plantarflexion    Ankle inversion    Ankle eversion     (Blank rows = WFL)  LOWER EXTREMITY MMT:    MMT Right eval Left eval  Hip flexion 4 4  Hip extension 4- 4-  Hip abduction 4 4  Hip adduction 5 5  Hip internal rotation wfl wfl  Hip  external rotation wfl Limited no P!  Knee flexion 5 5  Knee extension 5 5  Ankle dorsiflexion    Ankle plantarflexion    Ankle inversion    Ankle eversion     (Blank rows = WFL)  LUMBAR SPECIAL TESTS:  Straight leg  raise test: Negative, Slump test: Positive, and Thomas test: Negative    GAIT: Distance walked: 500 ft Assistive device utilized: None Level of assistance: Complete Independence Comments: No gait deviation noted today.  Pt with low level of pain at time of eval    TODAY'S TREATMENT  Eval Objective testing   PATIENT EDUCATION:  Education details: Discussed eval findings, rehab rationale and POC and patient is in agreement *insurance issues with duplication of services Person educated: Patient Education method: Explanation Education comprehension: verbalized understanding   HOME EXERCISE PROGRAM: Pt has from recent therapy intervention  ASSESSMENT:  CLINICAL IMPRESSION: Patient is a 56 y.o. m who was seen today for physical therapy evaluation and treatment for LB/buttock pain w/radicular symptoms lle as well as cervical stenosis with left should pain.  He is being seen by [facility] PT as well and has chosen to continue with assessment today with knowledge that he will need to discontinue with [facility] due to duplication of services.  He is instructed to call his insurance if he desires to continue here for aquatics and there for cervical issues.. Pt had a Lumbar disc injection 06/07/21. He states he had has a few injections but thery don't seem to be working.  He reports poor compliance with other PT clinics as well as HEP which he realizes may be a factor in his poor results from care.  He is referred here to aquatics specifically.  He does have other med issues of HTN, aortic aneurism as well as R medial menisectomy in July 2020.  He presents decreased left hip IR/ER and weakness in bilat hips.  Significant muscle atrophy in gluteal area.  Daily paresthesias into lle  to foot consistent with sciatic involvement. Shoulder ROM wfl left with decreased strength.  He will benefit from skilled physical therapy land based and aquatic based for improvement of muscle strength in weakened muscle groups, improve ROM and improved management of chronic pain. 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   OBJECTIVE IMPAIRMENTS cardiopulmonary status limiting activity, difficulty walking, decreased ROM, decreased strength, impaired flexibility, impaired sensation, and pain.   ACTIVITY LIMITATIONS carrying, lifting, bending, sitting, standing, squatting, stairs, and reach over head  PARTICIPATION LIMITATIONS: meal prep, cleaning, laundry, shopping, community activity, occupation, and yard work  PERSONAL FACTORS HTN, aortic aneurism  are also affecting patient's functional outcome.   REHAB POTENTIAL: Good  CLINICAL DECISION MAKING: Unstable/unpredictable  EVALUATION COMPLEXITY: High   GOALS: Goals reviewed with patient? Yes  SHORT TERM GOALS: Target date: 12/23/2021  Pt reports compliance with HEP Baseline:not compliant Goal status: INITIAL  2.  Left shoulder strength to be tested using dynamometer '@land'$  based appointment Baseline: MMT completed Goal status: INITIAL  3.  Pt will have decreased frequency of P&N/paresthesias into left foot. 5/7days Baseline: 7/7days Goal status: INITIAL  4.  Pt will report decrease in pain sx after aquatic session lasting at least a few hours. Baseline: ~3-4 constant throughout day Goal status: INITIAL   LONG TERM GOALS: Target date: 01/20/2022  Pt to meet FOTO goal of 55 Baseline: 47 Goal status: INITIAL  2.  Pt to be indep with final aquatic/land based HEP Baseline:  Goal status: INITIAL  3.  Pt will be able to complete standing ADL's without pain Baseline: pain Goal status: INITIAL  4.  Max pain to decrease to 5/10 or< to demonstrate improved management of and controlled pain Baseline: 8/10 Goal  status: INITIAL    PLAN: PT FREQUENCY: 1-2x/week  PT DURATION: 8 weeks  PLANNED INTERVENTIONS: Therapeutic exercises, Therapeutic activity, Neuromuscular re-education, Balance training, Gait training, Patient/Family education, Self Care, Joint mobilization, Joint manipulation, Stair training, Aquatic Therapy, Dry Needling, Electrical stimulation, Spinal manipulation, Spinal mobilization, Cryotherapy, Moist heat, Taping, Manual therapy, and Re-evaluation.  PLAN FOR NEXT SESSION: Pt to be seen initially both land based addressing cervical and shoulder and aquatic addressing core and LE.  Will progress as appropriate   Denton Meek, PT MPT 11/25/2021, 1:21 PM

## 2021-12-02 ENCOUNTER — Ambulatory Visit: Payer: BC Managed Care – PPO | Attending: Cardiology | Admitting: Pharmacist

## 2021-12-02 VITALS — BP 134/82 | HR 64 | Wt 201.8 lb

## 2021-12-02 DIAGNOSIS — I10 Essential (primary) hypertension: Secondary | ICD-10-CM

## 2021-12-02 DIAGNOSIS — R739 Hyperglycemia, unspecified: Secondary | ICD-10-CM

## 2021-12-02 NOTE — Patient Instructions (Addendum)
It was nice meeting you today  We would like your blood pressure to be less than 130/80  The blood pressure cuffs we recommend are made by [employer]. I recommend the Series 3 or better  Please try to remember to wear your CPAP more frequently  We will update your lab work today to check your kidney function  Try to take your blood pressure a few times over the weekend with your new cuff  Reduce your sodium intake  We will contact you next week with your results  Please call or message with any questions  Karren Cobble, PharmD, [place], [place], [place], [place], [place], [address] Phone: [phone], Fax: [phone]

## 2021-12-02 NOTE — Progress Notes (Signed)
Patient ID: Gregory Klein                 DOB: 05/28/1965                      MRN: [id number]      HPI: Gregory Klein is a 56 y.o. male referred by Dr. Oval Linsey to HTN clinic. PMH is significant for HTN , OSA, pre DM, and ascending aortic aneurysm. Started on carvedilol 12.'5mg'$  BID at first visit with Dr Oval Linsey.  Patient presents today for first HTN visit. 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 Checks blood pressure using a [facility] upper arm cuff and [facility] in pharmacy cuff. Reports blood pressure has begun to come down after starting carvedilol.  BP readings from machine in pharmacy: 127/87, 127/82, 138/84, 132/81.   BP reading using home cuff: 149/87  Has not been using CPAP faithfully at home due to needing supplies. Eats out frequently. Does not drink coffee.  Current HTN meds:  Carvedilol 12.'5mg'$  BID Losartan '50mg'$  daily  BP goal: <130/80  Wt Readings from Last 3 Encounters:  12/02/21 201 lb 12.8 oz (91.5 kg)  11/02/21 196 lb 12.8 oz (89.3 kg)  07/14/21 196 lb 6 oz (89.1 kg)   BP Readings from Last 3 Encounters:  12/02/21 134/82  11/02/21 (!) 144/92  07/14/21 134/80   Pulse Readings from Last 3 Encounters:  12/02/21 64  11/02/21 72  07/14/21 82    Renal function: CrCl cannot be calculated (Patient's most recent lab result is older than the maximum 21 days allowed.).  Past Medical History:  Diagnosis Date   Achilles tendonitis, bilateral 2016   [name], DPM   Acute medial meniscus tear of right knee    Acute myocarditis 2015   viral vs d/t strep infection, resolved w/o sequela   Allergy    Ascending aortic aneurysm ([place]) 11/02/2021   Deformity of metatarsal    Bilateral, mild DJD at the right 1st MTP joint and bilateral decrease in calcaneal inclination angle   DVT (deep venous thrombosis) ([facility])    GERD (gastroesophageal reflux disease)    Hemorrhoids    hx of   Hepatitis B infection    hx of dx 2005 aprox    Hyperglycemia 02/22/2015   Hypertension    Neck pain    local injections x 2, las summer 2011 (Dr.Vortek)   Plantar fasciitis, bilateral 2016   [name], DPM   Sleep apnea    on CPAP     Current Outpatient Medications on File Prior to Visit  Medication Sig Dispense Refill   aspirin EC 81 MG tablet Take 81 mg by mouth daily. Swallow whole.     carvedilol (COREG) 12.5 MG tablet Take 1 tablet (12.5 mg total) by mouth 2 (two) times daily. 180 tablet 3   cetirizine (ZYRTEC) 10 MG chewable tablet Chew 10 mg by mouth daily.     Cholecalciferol (VITAMIN D) 50 MCG (2000 UT) tablet Take 2,000 Units by mouth daily.     losartan (COZAAR) 50 MG tablet Take 1 tablet (50 mg total) by mouth daily. 90 tablet 1   OVER THE COUNTER MEDICATION Place 1-2 drops into both eyes as needed (for dry eyes). "[place] for Dry Eyes"     polyethylene glycol (MIRALAX / GLYCOLAX) packet Take 17 g by mouth daily.     Probiotic Product (PROBIOTIC DAILY PO) Take by mouth.  psyllium (METAMUCIL) 58.6 % powder Take 1 packet by mouth as needed (for constipation). Reported on 05/07/2015     valACYclovir (VALTREX) 1000 MG tablet TAKE 2 TABLETS BY MOUTH TWICE DAILY AS NEEDED FOR OUTBREAKS 30 tablet 6   No current facility-administered medications on file prior to visit.    Allergies  Allergen Reactions   Amoxicillin Rash   Vancomycin Itching    Pt developed itching while receiving Vancomycin IV. Itching subsided once antibiotic was complete. No interventions needed. AV, RN 06/07/21     Assessment/Plan:  1. Hypertension -  Patient BP in room 134/82 which is slightly above goal of <130/80.  Home blood pressure cuff does not appear to be accurate. Gave list of recommened [employer] cuffs for patient to purchase.  Discussed increasing losartan or adding on a new antihypertensive however patient is hesitant at this time. Possible he could reach goal with diet changes and wearing CPAP more consistently. Offered to reach  out to sleep coordinator for assistance in ordering supplies but patient declines.   Will check lab work today since losartan started a month ago and patient is due.  Advised to pick up new BP cuff and begin checking at home more frequently. If consistently above goal despite lifestyle changes will need to increase medication.  Continue losartan '50mg'$  daily Continue carvedilol 12.'5mg'$  BID  Karren Cobble, PharmD, BCACP, [place], [place], [place], [place], [address] Phone: [phone], Fax: [phone]

## 2021-12-03 LAB — HEMOGLOBIN A1C
Est. average glucose Bld gHb Est-mCnc: 128 mg/dL
Hgb A1c MFr Bld: 6.1 % — ABNORMAL HIGH (ref 4.8–5.6)

## 2021-12-03 LAB — CBC WITH DIFFERENTIAL/PLATELET
Basophils Absolute: 0 10*3/uL (ref 0.0–0.2)
Basos: 0 %
EOS (ABSOLUTE): 0 10*3/uL (ref 0.0–0.4)
Eos: 0 %
Hematocrit: 40.6 % (ref 37.5–51.0)
Hemoglobin: 13.4 g/dL (ref 13.0–17.7)
Immature Grans (Abs): 0 10*3/uL (ref 0.0–0.1)
Immature Granulocytes: 0 %
Lymphocytes Absolute: 1.1 10*3/uL (ref 0.7–3.1)
Lymphs: 24 %
MCH: 27.3 pg (ref 26.6–33.0)
MCHC: 33 g/dL (ref 31.5–35.7)
MCV: 83 fL (ref 79–97)
Monocytes Absolute: 0.5 10*3/uL (ref 0.1–0.9)
Monocytes: 11 %
Neutrophils Absolute: 2.9 10*3/uL (ref 1.4–7.0)
Neutrophils: 65 %
Platelets: 218 10*3/uL (ref 150–450)
RBC: 4.91 x10E6/uL (ref 4.14–5.80)
RDW: 13.9 % (ref 11.6–15.4)
WBC: 4.6 10*3/uL (ref 3.4–10.8)

## 2021-12-03 LAB — COMPREHENSIVE METABOLIC PANEL
ALT: 15 IU/L (ref 0–44)
AST: 16 IU/L (ref 0–40)
Albumin/Globulin Ratio: 1.6 (ref 1.2–2.2)
Albumin: 4 g/dL (ref 3.8–4.9)
Alkaline Phosphatase: 53 IU/L (ref 44–121)
BUN/Creatinine Ratio: 16 (ref 9–20)
BUN: 18 mg/dL (ref 6–24)
Bilirubin Total: 0.6 mg/dL (ref 0.0–1.2)
CO2: 26 mmol/L (ref 20–29)
Calcium: 8.9 mg/dL (ref 8.7–10.2)
Chloride: 104 mmol/L (ref 96–106)
Creatinine, Ser: 1.15 mg/dL (ref 0.76–1.27)
Globulin, Total: 2.5 g/dL (ref 1.5–4.5)
Glucose: 102 mg/dL — ABNORMAL HIGH (ref 70–99)
Potassium: 4.9 mmol/L (ref 3.5–5.2)
Sodium: 144 mmol/L (ref 134–144)
Total Protein: 6.5 g/dL (ref 6.0–8.5)
eGFR: 76 mL/min/{1.73_m2} (ref >59)

## 2021-12-05 ENCOUNTER — Other Ambulatory Visit: Payer: Self-pay | Admitting: Internal Medicine

## 2021-12-05 ENCOUNTER — Encounter: Payer: Self-pay | Admitting: Pharmacist

## 2021-12-09 ENCOUNTER — Ambulatory Visit (HOSPITAL_BASED_OUTPATIENT_CLINIC_OR_DEPARTMENT_OTHER): Payer: BC Managed Care – PPO | Attending: Cardiovascular Disease | Admitting: Physical Therapy

## 2021-12-09 ENCOUNTER — Encounter (HOSPITAL_BASED_OUTPATIENT_CLINIC_OR_DEPARTMENT_OTHER): Payer: Self-pay | Admitting: Physical Therapy

## 2021-12-09 DIAGNOSIS — M5432 Sciatica, left side: Secondary | ICD-10-CM | POA: Diagnosis not present

## 2021-12-09 DIAGNOSIS — M6281 Muscle weakness (generalized): Secondary | ICD-10-CM | POA: Insufficient documentation

## 2021-12-09 DIAGNOSIS — M5412 Radiculopathy, cervical region: Secondary | ICD-10-CM | POA: Diagnosis present

## 2021-12-09 DIAGNOSIS — M542 Cervicalgia: Secondary | ICD-10-CM | POA: Diagnosis present

## 2021-12-09 NOTE — Therapy (Signed)
OUTPATIENT PHYSICAL THERAPY THORACOLUMBAR EVALUATION   Patient Name: Gregory Klein MRN: [id number] DOB:Oct 17, 1965, 56 y.o., male Today's Date: 12/09/2021   PT End of Session - 12/09/21 1010     Visit Number 2    Number of Visits 16    Date for PT Re-Evaluation 01/20/22    Authorization Type BC/BS    PT Start Time [phone]    PT Stop Time 1030    PT Time Calculation (min) 43 min    Activity Tolerance Patient tolerated treatment well    Behavior During Therapy [facility] for tasks assessed/performed              Past Medical History:  Diagnosis Date   Achilles tendonitis, bilateral 2016   [name], DPM   Acute medial meniscus tear of right knee    Acute myocarditis 2015   viral vs d/t strep infection, resolved w/o sequela   Allergy    Ascending aortic aneurysm ([place]) 11/02/2021   Deformity of metatarsal    Bilateral, mild DJD at the right 1st MTP joint and bilateral decrease in calcaneal inclination angle   DVT (deep venous thrombosis) ([place])    GERD (gastroesophageal reflux disease)    Hemorrhoids    hx of   Hepatitis B infection    hx of dx 2005 aprox   Hyperglycemia 02/22/2015   Hypertension    Neck pain    local injections x 2, las summer 2011 (Dr.Vortek)   Plantar fasciitis, bilateral 2016   [name], DPM   Sleep apnea    on CPAP    Past Surgical History:  Procedure Laterality Date   INGUINAL HERNIA REPAIR     KNEE ARTHROSCOPY  6-15   Dr Mardelle Matte   KNEE ARTHROSCOPY WITH MEDIAL MENISECTOMY Right 09/26/2018   LEFT HEART CATHETERIZATION WITH CORONARY ANGIOGRAM N/A 12/29/2013   Procedure: LEFT HEART CATHETERIZATION WITH CORONARY ANGIOGRAM;  Surgeon: [name], MD;  Location: [facility] CATH LAB;  Service: Cardiovascular;  Laterality: N/A;   Patient Active Problem List   Diagnosis Date Noted   Ascending aortic aneurysm ([place]) 11/02/2021   Hyperglycemia 02/22/2015   PCP NOTES >>> 12/28/2014   OSA on CPAP 07/21/2014   Myocarditis ([place])    Hypertension    GERD  (gastroesophageal reflux disease)    Hepatitis B infection    Neck pain--Chronic 11/10/2013   Annual physical exam 03/29/2011    PCP: Colon Branch, MD  REFERRING PROVIDER: [name], MD  REFERRING DIAG:  I10 (ICD-10-CM) - Primary hypertension  I71.21 (ICD-10-CM) - Aneurysm of ascending aorta without rupture ([place])    Rationale for Evaluation and Treatment Rehabilitation  THERAPY DIAG:  Sciatica, left side  Muscle weakness (generalized)  Cervicalgia  ONSET DATE: 2 yrs  SUBJECTIVE:  SUBJECTIVE STATEMENT: "Everything is about the same"  Left buttock pain with radicular sx LLE. Pain originated in right leg then switched to left, radiating pins and needles daily from butt cheek to foot . P&N occasionally when waking/sometimes in day comes and goes and sometimes there upon waking. Neck pain moves into left shoulder and has gone down into my hand.  Pt reports he has been going to [facility] for a little while referred over there for his cervical and shoulder issues  but admits not being consistent and not completing HEP. He has not seen any positive results.  Pain started about 2 years ago and has progressively gotten worse.  Was referred over to Dr D. Brooks for assessment on possible surgical intervention  PERTINENT HISTORY:  56 y.o. male with a hx of acute myocarditis, DVT, hypertension, prediabetes, GERD, and sleep apnea on CPAP,  enlarged aorta .  Low back with sciatica pain and cervical neck pain.  Cervical stenosis;  KNEE ARTHROSCOPY WITH MEDIAL MENISECTOMY Right 09/26/2018     PAIN:  Are you having pain? Yes: NPRS scale: current 3/10 worst 8/1/0; least 0/10 Pain location: left buttock to foot Pain description: pins and needles, ache; radiating Aggravating factors: standing with  AD"s Relieving factors: lying down/sleeping   PRECAUTIONS: Cervical  WEIGHT BEARING RESTRICTIONS No  FALLS:  Has patient fallen in last 6 months? 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 Mild progression of central disc protrusion C3-4. Mild spinal stenosis and mild foraminal stenosis bilaterally similar to the prior study 2. Moderate spinal stenosis with moderate foraminal stenosis bilaterally at C4-5 and C5-6 unchanged 3. Mild foraminal narrowing bilaterally C6-7 unchanged. 03/29/21 Lumbar spine MRI: Mild degenerative changes as detailed above  PATIENT SURVEYS:  FOTO 47 with goal of 55  SCREENING FOR RED FLAGS:  Neg COGNITION:  Overall cognitive status: Within functional limits for tasks assessed     SENSATION: Paresthesias LLE daily, occasionally lue   MUSCLE LENGTH: Hamstrings: Right 80 deg; Left 70 deg Thomas test: neg  POSTURE: rounded shoulders and forward head  PALPATION: No tenderness to palpation  LUMBAR ROM:  WFL  UE ROM/STRENGTH ROM: Left shoulder; elbow wfl STR: Left shoulder ~4-/5 (plan to measure using dynamometer @ land based appointment)     LOWER EXTREMITY ROM:     Active  Right eval Left eval  Hip flexion 80 70 in supine  Hip extension 10 5d  Hip abduction    Hip adduction    Hip internal rotation wfl wfl  Hip external rotation Limited 25% Limited 50%  Knee flexion    Knee extension    Ankle dorsiflexion    Ankle plantarflexion    Ankle inversion    Ankle eversion     (Blank rows = WFL)  LOWER EXTREMITY MMT:    MMT Right eval Left eval  Hip flexion 4 4  Hip extension 4- 4-  Hip abduction 4 4  Hip adduction 5 5  Hip internal rotation wfl wfl  Hip  external rotation wfl Limited no P!  Knee flexion 5 5  Knee extension 5 5  Ankle dorsiflexion    Ankle plantarflexion    Ankle inversion    Ankle eversion     (Blank rows = WFL)  LUMBAR SPECIAL TESTS:  Straight leg raise test: Negative, Slump test: Positive, and Thomas test: Negative    GAIT: Distance walked: 500 ft Assistive device utilized: None Level of assistance: Complete Independence Comments: No gait deviation noted today.  Pt with low level of pain at time of eval    TODAY'S TREATMENT  Pt seen for aquatic therapy today.  Treatment took place in water 3.25-4.5 ft in depth at the [facility]. Temp of water was 91.  Pt entered/exited the pool via stairs with hand rail.  Intro to session Walking forward, back, sidestepping in 4.48f Stretching at steps: figure 4; hamstring; gastroc; L stretch Holding to wall 4 ft: hip openers, hip extension; squats. Required wall support for balance Seated: sciatic nerve slide lle Standing: kick bord row le staggered 2x15; abdominal sets pushing kickboard down 5x 10s hold Forward walking with head rotations nd arms wing for cervical and shoulder  ROM  Pt requires the buoyancy and hydrostatic pressure of water for support, and to offload joints by unweighting joint load by at least 50 % in navel deep water and by at least 75-80% in chest to neck deep water.  Viscosity of the water is needed for resistance of strengthening. Water current perturbations provides challenge to standing balance requiring increased core activation.     PATIENT EDUCATION:  Education details: Discussed eval findings, rehab rationale and POC and patient is in agreement *insurance issues with duplication of services Person educated: Patient Education method: Explanation Education comprehension: verbalized understanding   HOME EXERCISE PROGRAM: Pt has from recent therapy intervention  ASSESSMENT:  CLINICAL IMPRESSION: Pt indep and safe in  setting. Therapist instructing form deck.  Emphasis on session today is initiating movement and stretching assessing toleration. Left hip limited vs right in ER.  He does have > sensitivity in left buttock with all activities but no increase in radicular sx. Increased shoulder and cervical tightness with focus on back.  Pt given vc and demonstration for execution of exercises. He completes session with slight reduction in pain.1/10.  Will be seen land based next session for further assessment and treatment of cervical and shoulder. He has been instructed to have order transferred from referring MD for shoulder and cervical dysfunction (see below), prior to next visit.  Patient is a 56y.o. m who was seen today for physical therapy evaluation and treatment for LB/buttock pain w/radicular symptoms lle. He also reports issues with cervical stenosis and left should pain.  [facility] but has chosen to continue with assessment today with knowledge that he will need to discontinue with [facility] due to duplication of services.  He is instructed to call his insurance if he desires to continue here for aquatics and there for cervical issues.. Pt had a Lumbar disc injection 06/07/21. He states he had has a few injections but thery don't seem to be working.  He reports poor compliance with other PT clinics as well as HEP which he realizes may be a factor in his poor results from care.  He is referred here to aquatics specifically.  He does have other med issues of HTN, aortic aneurism as well as R medial menisectomy in July 2020.  He presents decreased left hip IR/ER and weakness in bilat hips.  Significant muscle atrophy in gluteal area.  Daily paresthesias into lle to foot consistent with sciatic involvement. Shoulder ROM wfl left with decreased strength.  He will benefit from skilled physical therapy  land based and aquatic based for improvement of muscle strength in weakened muscle  groups, improve ROM and improved management of chronic pain. 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   OBJECTIVE IMPAIRMENTS cardiopulmonary status limiting activity, difficulty walking, decreased ROM, decreased strength, impaired flexibility, impaired sensation, and pain.   ACTIVITY LIMITATIONS carrying, lifting, bending, sitting, standing, squatting, stairs, and reach over head  PARTICIPATION LIMITATIONS: meal prep, cleaning, laundry, shopping, community activity, occupation, and yard work  PERSONAL FACTORS HTN, aortic aneurism  are also affecting patient's functional outcome.   REHAB POTENTIAL: Good  CLINICAL DECISION MAKING: Unstable/unpredictable  EVALUATION COMPLEXITY: High   GOALS: Goals reviewed with patient? Yes  SHORT TERM GOALS: Target date: 01/06/2022  Pt reports compliance with HEP Baseline:not compliant Goal status: INITIAL  2.  Left shoulder strength to be tested using dynamometer '@land'$  based appointment Baseline: MMT completed Goal status: INITIAL  3.  Pt will have decreased frequency of P&N/paresthesias into left foot. 5/7days Baseline: 7/7days Goal status: INITIAL  4.  Pt will report decrease in pain sx after aquatic session lasting at least a few hours. Baseline: ~3-4 constant throughout day Goal status: INITIAL   LONG TERM GOALS: Target date: 02/03/2022  Pt to meet FOTO goal of 55 Baseline: 47 Goal status: INITIAL  2.  Pt to be indep with final aquatic/land based HEP Baseline:  Goal status: INITIAL  3.  Pt will be able to complete standing ADL's without pain Baseline: pain Goal status: INITIAL  4.  Max pain to decrease to 5/10 or< to demonstrate improved management of and controlled pain Baseline: 8/10 Goal status: INITIAL    PLAN: PT FREQUENCY: 1-2x/week  PT DURATION: 8 weeks  PLANNED INTERVENTIONS: Therapeutic exercises, Therapeutic activity, Neuromuscular re-education, Balance training, Gait training, Patient/Family  education, Self Care, Joint mobilization, Joint manipulation, Stair training, Aquatic Therapy, Dry Needling, Electrical stimulation, Spinal manipulation, Spinal mobilization, Cryotherapy, Moist heat, Taping, Manual therapy, and Re-evaluation.  PLAN FOR NEXT SESSION: Pt to be seen initially both land based addressing cervical and shoulder and aquatic addressing core and LE.  Will progress as appropriate   Denton Meek, PT MPT 12/09/2021, 10:33 AM

## 2021-12-13 ENCOUNTER — Ambulatory Visit (HOSPITAL_BASED_OUTPATIENT_CLINIC_OR_DEPARTMENT_OTHER): Payer: BC Managed Care – PPO | Admitting: Physical Therapy

## 2021-12-21 ENCOUNTER — Ambulatory Visit (HOSPITAL_BASED_OUTPATIENT_CLINIC_OR_DEPARTMENT_OTHER): Payer: BC Managed Care – PPO | Admitting: Physical Therapy

## 2021-12-21 ENCOUNTER — Encounter (HOSPITAL_BASED_OUTPATIENT_CLINIC_OR_DEPARTMENT_OTHER): Payer: Self-pay | Admitting: Physical Therapy

## 2021-12-21 DIAGNOSIS — M542 Cervicalgia: Secondary | ICD-10-CM

## 2021-12-21 DIAGNOSIS — M6281 Muscle weakness (generalized): Secondary | ICD-10-CM

## 2021-12-21 DIAGNOSIS — M5432 Sciatica, left side: Secondary | ICD-10-CM | POA: Diagnosis not present

## 2021-12-21 NOTE — Therapy (Signed)
OUTPATIENT PHYSICAL THERAPY THORACOLUMBAR TREATMENT   Patient Name: Gregory Klein MRN: [id number] DOB:1965-10-29, 56 y.o., male Today's Date: 12/21/2021   PT End of Session - 12/21/21 0822     Visit Number 3    Number of Visits 16    Date for PT Re-Evaluation 01/20/22    Authorization Type BC/BS    PT Start Time 0821    PT Stop Time 0900    PT Time Calculation (min) 39 min    Activity Tolerance Patient tolerated treatment well    Behavior During Therapy [facility] for tasks assessed/performed              Past Medical History:  Diagnosis Date   Achilles tendonitis, bilateral 2016   [name], DPM   Acute medial meniscus tear of right knee    Acute myocarditis 2015   viral vs d/t strep infection, resolved w/o sequela   Allergy    Ascending aortic aneurysm ([place]) 11/02/2021   Deformity of metatarsal    Bilateral, mild DJD at the right 1st MTP joint and bilateral decrease in calcaneal inclination angle   DVT (deep venous thrombosis) ([facility])    GERD (gastroesophageal reflux disease)    Hemorrhoids    hx of   Hepatitis B infection    hx of dx 2005 aprox   Hyperglycemia 02/22/2015   Hypertension    Neck pain    local injections x 2, las summer 2011 (Dr.Vortek)   Plantar fasciitis, bilateral 2016   [name], DPM   Sleep apnea    on CPAP    Past Surgical History:  Procedure Laterality Date   INGUINAL HERNIA REPAIR     KNEE ARTHROSCOPY  6-15   Dr Mardelle Matte   KNEE ARTHROSCOPY WITH MEDIAL MENISECTOMY Right 09/26/2018   LEFT HEART CATHETERIZATION WITH CORONARY ANGIOGRAM N/A 12/29/2013   Procedure: LEFT HEART CATHETERIZATION WITH CORONARY ANGIOGRAM;  Surgeon: [name], MD;  Location: [facility] CATH LAB;  Service: Cardiovascular;  Laterality: N/A;   Patient Active Problem List   Diagnosis Date Noted   Ascending aortic aneurysm ([place]) 11/02/2021   Hyperglycemia 02/22/2015   PCP NOTES >>> 12/28/2014   OSA on CPAP 07/21/2014   Myocarditis ([place])    Hypertension    GERD  (gastroesophageal reflux disease)    Hepatitis B infection    Neck pain--Chronic 11/10/2013   Annual physical exam 03/29/2011    PCP: Colon Branch, MD  REFERRING PROVIDER: [name], MD  REFERRING DIAG:  I10 (ICD-10-CM) - Primary hypertension  I71.21 (ICD-10-CM) - Aneurysm of ascending aorta without rupture ([place])    Rationale for Evaluation and Treatment Rehabilitation  THERAPY DIAG:  Sciatica, left side  Muscle weakness (generalized)  Cervicalgia  ONSET DATE: 2 yrs  SUBJECTIVE:  SUBJECTIVE STATEMENT: Pt reports he is starting to feel a little better since last visit. He needs to follow up on referral for neck/shoulder   From Eval: Left buttock pain with radicular sx LLE. Pain originated in right leg then switched to left, radiating pins and needles daily from butt cheek to foot . P&N occasionally when waking/sometimes in day comes and goes and sometimes there upon waking. Neck pain moves into left shoulder and has gone down into my hand.  Pt reports he has been going to [facility] for a little while referred over there for his cervical and shoulder issues  but admits not being consistent and not completing HEP. He has not seen any positive results.  Pain started about 2 years ago and has progressively gotten worse.  Was referred over to Dr D. Brooks for assessment on possible surgical intervention  PERTINENT HISTORY:  56 y.o. male with a hx of acute myocarditis, DVT, hypertension, prediabetes, GERD, and sleep apnea on CPAP,  enlarged aorta .  Low back with sciatica pain and cervical neck pain.  Cervical stenosis;  KNEE ARTHROSCOPY WITH MEDIAL MENISECTOMY Right 09/26/2018     PAIN:  Are you having pain? no: NPRS scale: current 0/10 Pain location:  Pain description: pins and  needles, ache; radiating Aggravating factors: standing with AD"s Relieving factors: lying down/sleeping   PRECAUTIONS: Cervical  WEIGHT BEARING RESTRICTIONS No  FALLS:  Has patient fallen in last 6 months? 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 Mild progression of central disc protrusion C3-4. Mild spinal stenosis and mild foraminal stenosis bilaterally similar to the prior study 2. Moderate spinal stenosis with moderate foraminal stenosis bilaterally at C4-5 and C5-6 unchanged 3. Mild foraminal narrowing bilaterally C6-7 unchanged. 03/29/21 Lumbar spine MRI: Mild degenerative changes as detailed above  PATIENT SURVEYS:  FOTO 47 with goal of 55  SCREENING FOR RED FLAGS:  Neg COGNITION:  Overall cognitive status: Within functional limits for tasks assessed     SENSATION: Paresthesias LLE daily, occasionally lue   MUSCLE LENGTH: Hamstrings: Right 80 deg; Left 70 deg Thomas test: neg  POSTURE: rounded shoulders and forward head  PALPATION: No tenderness to palpation  LUMBAR ROM:  WFL  UE ROM/STRENGTH ROM: Left shoulder; elbow wfl STR: Left shoulder ~4-/5 (plan to measure using dynamometer @ land based appointment)     LOWER EXTREMITY ROM:     Active  Right eval Left eval  Hip flexion 80 70 in supine  Hip extension 10 5d  Hip abduction    Hip adduction    Hip internal rotation wfl wfl  Hip external rotation Limited 25% Limited 50%  Knee flexion    Knee extension    Ankle dorsiflexion    Ankle plantarflexion    Ankle inversion    Ankle eversion     (Blank rows = WFL)  LOWER EXTREMITY MMT:    MMT Right eval Left eval  Hip flexion 4 4  Hip extension 4- 4-  Hip abduction 4 4   Hip adduction 5 5  Hip internal rotation wfl wfl  Hip external rotation wfl Limited no P!  Knee flexion 5 5  Knee extension 5 5  Ankle dorsiflexion    Ankle plantarflexion    Ankle inversion  Ankle eversion     (Blank rows = WFL)  LUMBAR SPECIAL TESTS:  Straight leg raise test: Negative, Slump test: Positive, and Thomas test: Negative    GAIT: Distance walked: 500 ft Assistive device utilized: None Level of assistance: Complete Independence Comments: No gait deviation noted today.  Pt with low level of pain at time of eval    TODAY'S TREATMENT  Pt seen for aquatic therapy today.  Treatment took place in water 3.25-4.5 ft in depth at the [facility]. Temp of water was 92.  Pt entered/exited the pool via stairs with hand rail.   Walking forward, back, sidestepping in 4.13f Holding to wall 4 ft: hip openers/crosses, hip extension; hip abdct; squats; heel raises; leg swings  Back near wall:  bilat shoulder horiz abdct/add holding yellow hand buoys; push downs at side for lower trap/core; levator stretch L/R/L Forward walking with horz head movements for neck ROM Stretching at steps: bilat calf (heels off of step), hamstring with DF/PF of foot; fig 4   Pt requires the buoyancy and hydrostatic pressure of water for support, and to offload joints by unweighting joint load by at least 50 % in navel deep water and by at least 75-80% in chest to neck deep water.  Viscosity of the water is needed for resistance of strengthening. Water current perturbations provides challenge to standing balance requiring increased core activation.     PATIENT EDUCATION:  Education details: aquatics progressions  Person educated: Patient Education method: Explanation Education comprehension: verbalized understanding   HOME EXERCISE PROGRAM: Pt has from recent therapy intervention  ASSESSMENT:  CLINICAL IMPRESSION: Pt remained pain and symptom free throughout session.  He  noted increased tightness in L posterior chain with hamstring stretch today.  Verbally assigned him seated hamstring and piriformis stretch for work.  Goals are ongoing.    Will be seen land based for further assessment and treatment of cervical and shoulder when referral is received. He has been instructed to have order transferred from referring MD for shoulder and cervical dysfunction .    OBJECTIVE IMPAIRMENTS cardiopulmonary status limiting activity, difficulty walking, decreased ROM, decreased strength, impaired flexibility, impaired sensation, and pain.   ACTIVITY LIMITATIONS carrying, lifting, bending, sitting, standing, squatting, stairs, and reach over head  PARTICIPATION LIMITATIONS: meal prep, cleaning, laundry, shopping, community activity, occupation, and yard work  PERSONAL FACTORS HTN, aortic aneurism  are also affecting patient's functional outcome.   REHAB POTENTIAL: Good  CLINICAL DECISION MAKING: Unstable/unpredictable  EVALUATION COMPLEXITY: High   GOALS: Goals reviewed with patient? Yes  SHORT TERM GOALS: Target date: 01/18/2022  Pt reports compliance with HEP Baseline:not compliant Goal status: INITIAL  2.  Left shoulder strength to be tested using dynamometer '@land'$  based appointment Baseline: MMT completed Goal status: INITIAL  3.  Pt will have decreased frequency of P&N/paresthesias into left foot. 5/7days Baseline: 7/7days Goal status: INITIAL  4.  Pt will report decrease in pain sx after aquatic session lasting at least a few hours. Baseline: ~3-4 constant throughout day Goal status: INITIAL   LONG TERM GOALS: Target date: 02/15/2022  Pt to meet FOTO goal of 55 Baseline: 47 Goal status: INITIAL  2.  Pt to be indep with final aquatic/land based HEP Baseline:  Goal status: INITIAL  3.  Pt will be able to complete standing ADL's without pain Baseline: pain Goal status: INITIAL  4.  Max pain to decrease to 5/10 or< to demonstrate  improved management of and controlled pain Baseline: 8/10 Goal status:  INITIAL    PLAN: PT FREQUENCY: 1-2x/week  PT DURATION: 8 weeks  PLANNED INTERVENTIONS: Therapeutic exercises, Therapeutic activity, Neuromuscular re-education, Balance training, Gait training, Patient/Family education, Self Care, Joint mobilization, Joint manipulation, Stair training, Aquatic Therapy, Dry Needling, Electrical stimulation, Spinal manipulation, Spinal mobilization, Cryotherapy, Moist heat, Taping, Manual therapy, and Re-evaluation.  PLAN FOR NEXT SESSION: Pt to be seen initially both land based addressing cervical and shoulder and aquatic addressing core and LE.  Will progress as appropriate  Kerin Perna, PTA 12/21/21 2:22 PM [facility] [place], [place], [address] Phone: [phone]   Fax:  [phone]

## 2021-12-23 ENCOUNTER — Encounter (HOSPITAL_BASED_OUTPATIENT_CLINIC_OR_DEPARTMENT_OTHER): Payer: Self-pay | Admitting: Physical Therapy

## 2021-12-23 ENCOUNTER — Ambulatory Visit (HOSPITAL_BASED_OUTPATIENT_CLINIC_OR_DEPARTMENT_OTHER): Payer: BC Managed Care – PPO | Admitting: Physical Therapy

## 2021-12-23 DIAGNOSIS — M6281 Muscle weakness (generalized): Secondary | ICD-10-CM

## 2021-12-23 DIAGNOSIS — M5432 Sciatica, left side: Secondary | ICD-10-CM | POA: Diagnosis not present

## 2021-12-23 DIAGNOSIS — M542 Cervicalgia: Secondary | ICD-10-CM

## 2021-12-23 DIAGNOSIS — M5412 Radiculopathy, cervical region: Secondary | ICD-10-CM

## 2021-12-23 NOTE — Therapy (Signed)
OUTPATIENT PHYSICAL THERAPY THORACOLUMBAR TREATMENT   Patient Name: Gregory Klein MRN: [id number] DOB:06/06/1965, 56 y.o., male Today's Date: 12/23/2021   PT End of Session - 12/23/21 1157     Visit Number 4    Number of Visits 16    Date for PT Re-Evaluation 01/20/22    Authorization Type BC/BS    PT Start Time [phone]   arrives late   PT Stop Time 0929    PT Time Calculation (min) 39 min    Activity Tolerance Patient tolerated treatment well    Behavior During Therapy [facility] for tasks assessed/performed               Past Medical History:  Diagnosis Date   Achilles tendonitis, bilateral 2016   [name], DPM   Acute medial meniscus tear of right knee    Acute myocarditis 2015   viral vs d/t strep infection, resolved w/o sequela   Allergy    Ascending aortic aneurysm ([place]) 11/02/2021   Deformity of metatarsal    Bilateral, mild DJD at the right 1st MTP joint and bilateral decrease in calcaneal inclination angle   DVT (deep venous thrombosis) (HCC)    GERD (gastroesophageal reflux disease)    Hemorrhoids    hx of   Hepatitis B infection    hx of dx 2005 aprox   Hyperglycemia 02/22/2015   Hypertension    Neck pain    local injections x 2, las summer 2011 (Dr.Vortek)   Plantar fasciitis, bilateral 2016   [name], DPM   Sleep apnea    on CPAP    Past Surgical History:  Procedure Laterality Date   INGUINAL HERNIA REPAIR     KNEE ARTHROSCOPY  6-15   Dr Mardelle Matte   KNEE ARTHROSCOPY WITH MEDIAL MENISECTOMY Right 09/26/2018   LEFT HEART CATHETERIZATION WITH CORONARY ANGIOGRAM N/A 12/29/2013   Procedure: LEFT HEART CATHETERIZATION WITH CORONARY ANGIOGRAM;  Surgeon: [name], MD;  Location: [facility] CATH LAB;  Service: Cardiovascular;  Laterality: N/A;   Patient Active Problem List   Diagnosis Date Noted   Ascending aortic aneurysm ([place]) 11/02/2021   Hyperglycemia 02/22/2015   PCP NOTES >>> 12/28/2014   OSA on CPAP 07/21/2014   Myocarditis ([place])     Hypertension    GERD (gastroesophageal reflux disease)    Hepatitis B infection    Neck pain--Chronic 11/10/2013   Annual physical exam 03/29/2011    PCP: Colon Branch, MD  REFERRING PROVIDER: [name], MD  REFERRING DIAG:  I10 (ICD-10-CM) - Primary hypertension  I71.21 (ICD-10-CM) - Aneurysm of ascending aorta without rupture [facility])    Rationale for Evaluation and Treatment Rehabilitation  THERAPY DIAG:  Sciatica, left side  Muscle weakness (generalized)  Cervicalgia  Radiculopathy, cervical region  ONSET DATE: 2 yrs  SUBJECTIVE:  SUBJECTIVE STATEMENT: Pt states that he has been dealing with the neck pain for years. When the back pain started, he has issues with the R side of his neck. He had radiating pain into the R UE. He has it more in the L side currently. He had injections that have made minimal difference. The L arm feels heavy to lift. Reaching BHB causes L UT type pain. Pain is stress related while at work. Pain goes into the deltoid. He was not compliant with previous neck HEP. Will occasionally get hand NT. Will occasionally have R sided hand cramping while holding a fork. Reaching OH makes the L UT feel heavy "like theres a weight." Denies 5 D's 3N's.    From Eval: Left buttock pain with radicular sx LLE. Pain originated in right leg then switched to left, radiating pins and needles daily from butt cheek to foot . P&N occasionally when waking/sometimes in day comes and goes and sometimes there upon waking. Neck pain moves into left shoulder and has gone down into my hand.  Pt reports he has been going to [facility] PT for a little while referred over there for his cervical and shoulder issues  but admits not being consistent and not completing HEP. He has not seen any positive  results.  Pain started about 2 years ago and has progressively gotten worse.  Was referred over to Dr D. Brooks for assessment on possible surgical intervention  PERTINENT HISTORY:  56 y.o. male with a hx of acute myocarditis, DVT, hypertension, prediabetes, GERD, and sleep apnea on CPAP,  enlarged aorta .  Low back with sciatica pain and cervical neck pain.  Cervical stenosis;  KNEE ARTHROSCOPY WITH MEDIAL MENISECTOMY Right 09/26/2018     PAIN:  Are you having pain? no: NPRS scale: current 0/10 Pain location:  Pain description: pins and needles, ache; radiating Aggravating factors: standing with AD"s Relieving factors: lying down/sleeping   PRECAUTIONS: Cervical  WEIGHT BEARING RESTRICTIONS No  FALLS:  Has patient fallen in last 6 months? 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 Mild progression of central disc protrusion C3-4. Mild spinal stenosis and mild foraminal stenosis bilaterally similar to the prior study 2. Moderate spinal stenosis with moderate foraminal stenosis bilaterally at C4-5 and C5-6 unchanged 3. Mild foraminal narrowing bilaterally C6-7 unchanged. 03/29/21 Lumbar spine MRI: Mild degenerative changes as detailed above  PATIENT SURVEYS:  FOTO 47 with goal of 55  SCREENING FOR RED FLAGS:  Neg COGNITION:  Overall cognitive status: Within functional limits for tasks assessed     CERVICAL ROM:   Active ROM A/PROM (deg) eval  Flexion WFL, tightness on L  Extension WFL   Right lateral flexion WFL   Left lateral flexion WFL  Right rotation WFL  Left rotation WFL   (Blank rows = not tested)   CERVICAL SPECIAL TESTS:  Upper limb tension test (ULTT): Negative, Spurling's test:  Negative, and Distraction test: Negative   Bilat C6 and C7 reflexes 1+   UPPER EXTREMITY MMT:  MMT Right eval Left eval  Shoulder flexion 4+/5 4/5  Shoulder extension    Shoulder abduction 4+/5 4/5  Shoulder adduction 4+/5 4/5  Elbow flexion 4+/5 4/5  Elbow extension    Wrist flexion  Wrist extension 4+/5 4/5   (Blank rows = not tested)   TODAY'S TREATMENT   10/27  Exercises - Seated Cervical Retraction  - 5-6 x daily - 7 x weekly - 1 sets - 5-10 reps - Shoulder External Rotation and Scapular Retraction with Resistance  - 5-6 x daily - 7 x weekly - 1 sets - 5-10 reps - Gentle Levator Scapulae Stretch  - 2 x daily - 7 x weekly - 1 sets - 3 reps - 30 hold  Previous:  Pt seen for aquatic therapy today.  Treatment took place in water 3.25-4.5 ft in depth at the [place]. Temp of water was 92.  Pt entered/exited the pool via stairs with hand rail.   Walking forward, back, sidestepping in 4.64f Holding to wall 4 ft: hip openers/crosses, hip extension; hip abdct; squats; heel raises; leg swings  Back near wall:  bilat shoulder horiz abdct/add holding yellow hand buoys; push downs at side for lower trap/core; levator stretch L/R/L Forward walking with horz head movements for neck ROM Stretching at steps: bilat calf (heels off of step), hamstring with DF/PF of foot; fig 4   Pt requires the buoyancy and hydrostatic pressure of water for support, and to offload joints by unweighting joint load by at least 50 % in navel deep water and by at least 75-80% in chest to neck deep water.  Viscosity of the water is needed for resistance of strengthening. Water current perturbations provides challenge to standing balance requiring increased core activation.     PATIENT EDUCATION:  Education details:  MOI, diagnosis, prognosis, anatomy, postural changes, centralization vs peripherilization, muscle firing,  envelope of function, HEP, POC  Person educated:  Patient Education method: Explanation Education comprehension: verbalized understanding   HOME EXERCISE PROGRAM:  Access Code: [email or web address]: [email or web address] Date: 12/23/2021 Prepared by: ADaleen Bo ASSESSMENT:  CLINICAL IMPRESSION: Pt's c/c of L sided neck pain consistent with a C/S radiculopathy type condition that is concurrent with imaging results. Pt does have more L sided UE weakness as well as pain that does cross the [facility]. Pt without pain during session and no recreation of pain with exercise. Pt advised of daily and hourly postural changes to counteract static positioning in C/S ext and T/S flexion. Pt given edu about centralization of symptoms as well as self pain management techniques such as massage, mindfulness, and deep breathing as pt does report high stress occupation. Plan to continue with alternating land and aquatic visits for management of LBP and cervicalgia. Pt cervical pain discussed in previous MD note but pt also to get specific referral for C/S pain from orthopedic MD. Pt would benefit from continued skilled therapy in order to reach goals and maximize functional UE and lumbopelvic strength and ROM for full return to PLOF.     OBJECTIVE IMPAIRMENTS cardiopulmonary status limiting activity, difficulty walking, decreased ROM, decreased strength, impaired flexibility, impaired sensation, and pain.   ACTIVITY LIMITATIONS carrying, lifting, bending, sitting, standing, squatting, stairs, and reach over head  PARTICIPATION LIMITATIONS: meal prep, cleaning, laundry, shopping, community activity, occupation, and yard work  PERSONAL FACTORS HTN, aortic aneurism  are also affecting patient's functional outcome.   REHAB POTENTIAL: Good  CLINICAL DECISION MAKING: Unstable/unpredictable  EVALUATION COMPLEXITY: High   GOALS: Goals reviewed with patient? Yes  SHORT TERM GOALS: Target date: 01/20/2022  Pt reports compliance with HEP Baseline:not  compliant Goal status: INITIAL  2.  Left shoulder strength to be tested using dynamometer '@land'$  based appointment Baseline:  MMT completed Goal status: INITIAL  3.  Pt will have decreased frequency of P&N/paresthesias into left foot. 5/7days Baseline: 7/7days Goal status: INITIAL  4.  Pt will report decrease in pain sx after aquatic session lasting at least a few hours. Baseline: ~3-4 constant throughout day Goal status: INITIAL   LONG TERM GOALS: Target date: 02/17/2022  Pt to meet FOTO goal of 55 Baseline: 47 Goal status: INITIAL  2.  Pt to be indep with final aquatic/land based HEP Baseline:  Goal status: INITIAL  3.  Pt will be able to complete standing ADL's without pain Baseline: pain Goal status: INITIAL  4.  Max pain to decrease to 5/10 or< to demonstrate improved management of and controlled pain Baseline: 8/10 Goal status: INITIAL    PLAN: PT FREQUENCY: 1-2x/week  PT DURATION: 8 weeks  PLANNED INTERVENTIONS: Therapeutic exercises, Therapeutic activity, Neuromuscular re-education, Balance training, Gait training, Patient/Family education, Self Care, Joint mobilization, Joint manipulation, Stair training, Aquatic Therapy, Dry Needling, Electrical stimulation, Spinal manipulation, Spinal mobilization, Cryotherapy, Moist heat, Taping, Manual therapy, and Re-evaluation.  PLAN FOR NEXT SESSION: Pt to be seen initially both land based addressing cervical and shoulder and aquatic addressing core and LE.  Will progress as appropriate  Daleen Bo PT, DPT 12/23/21 12:05 PM

## 2021-12-27 ENCOUNTER — Ambulatory Visit (HOSPITAL_BASED_OUTPATIENT_CLINIC_OR_DEPARTMENT_OTHER): Payer: BC Managed Care – PPO | Admitting: Physical Therapy

## 2021-12-27 ENCOUNTER — Encounter (HOSPITAL_BASED_OUTPATIENT_CLINIC_OR_DEPARTMENT_OTHER): Payer: Self-pay | Admitting: Physical Therapy

## 2021-12-27 DIAGNOSIS — M542 Cervicalgia: Secondary | ICD-10-CM

## 2021-12-27 DIAGNOSIS — M5432 Sciatica, left side: Secondary | ICD-10-CM

## 2021-12-27 DIAGNOSIS — M6281 Muscle weakness (generalized): Secondary | ICD-10-CM

## 2021-12-27 NOTE — Therapy (Signed)
OUTPATIENT PHYSICAL THERAPY THORACOLUMBAR TREATMENT   Patient Name: Gregory Klein MRN: [id number] DOB:1965-11-27, 56 y.o., male Today's Date: 12/27/2021   PT End of Session - 12/27/21 0852     Visit Number 5    Number of Visits 16    Date for PT Re-Evaluation 01/20/22    Authorization Type BC/BS    PT Start Time 0802    PT Stop Time 8676    PT Time Calculation (min) 41 min    Activity Tolerance Patient tolerated treatment well    Behavior During Therapy [facility] for tasks assessed/performed                Past Medical History:  Diagnosis Date   Achilles tendonitis, bilateral 2016   [name], DPM   Acute medial meniscus tear of right knee    Acute myocarditis 2015   viral vs d/t strep infection, resolved w/o sequela   Allergy    Ascending aortic aneurysm ([place]) 11/02/2021   Deformity of metatarsal    Bilateral, mild DJD at the right 1st MTP joint and bilateral decrease in calcaneal inclination angle   DVT (deep venous thrombosis) ([place])    GERD (gastroesophageal reflux disease)    Hemorrhoids    hx of   Hepatitis B infection    hx of dx 2005 aprox   Hyperglycemia 02/22/2015   Hypertension    Neck pain    local injections x 2, las summer 2011 (Dr.Vortek)   Plantar fasciitis, bilateral 2016   [name], DPM   Sleep apnea    on CPAP    Past Surgical History:  Procedure Laterality Date   INGUINAL HERNIA REPAIR     KNEE ARTHROSCOPY  6-15   Dr Mardelle Matte   KNEE ARTHROSCOPY WITH MEDIAL MENISECTOMY Right 09/26/2018   LEFT HEART CATHETERIZATION WITH CORONARY ANGIOGRAM N/A 12/29/2013   Procedure: LEFT HEART CATHETERIZATION WITH CORONARY ANGIOGRAM;  Surgeon: [name], MD;  Location: [facility] CATH LAB;  Service: Cardiovascular;  Laterality: N/A;   Patient Active Problem List   Diagnosis Date Noted   Ascending aortic aneurysm ([place]) 11/02/2021   Hyperglycemia 02/22/2015   PCP NOTES >>> 12/28/2014   OSA on CPAP 07/21/2014   Myocarditis ([place])    Hypertension     GERD (gastroesophageal reflux disease)    Hepatitis B infection    Neck pain--Chronic 11/10/2013   Annual physical exam 03/29/2011    PCP: Colon Branch, MD  REFERRING PROVIDER: [name], MD  REFERRING DIAG:  I10 (ICD-10-CM) - Primary hypertension  I71.21 (ICD-10-CM) - Aneurysm of ascending aorta without rupture ([place])    Rationale for Evaluation and Treatment Rehabilitation  THERAPY DIAG:  Sciatica, left side  Muscle weakness (generalized)  Cervicalgia  ONSET DATE: 2 yrs  SUBJECTIVE:  SUBJECTIVE STATEMENT: Pt reports minimal change since last session. HEP feels like muscle working effort but not pain.    Pt states that he has been dealing with the neck pain for years. When the back pain started, he has issues with the R side of his neck. He had radiating pain into the R UE. He has it more in the L side currently. He had injections that have made minimal difference. The L arm feels heavy to lift. Reaching BHB causes L UT type pain. Pain is stress related while at work. Pain goes into the deltoid. He was not compliant with previous neck HEP. Will occasionally get hand NT. Will occasionally have R sided hand cramping while holding a fork. Reaching OH makes the L UT feel heavy "like theres a weight." Denies 5 D's 3N's.    From Eval: Left buttock pain with radicular sx LLE. Pain originated in right leg then switched to left, radiating pins and needles daily from butt cheek to foot . P&N occasionally when waking/sometimes in day comes and goes and sometimes there upon waking. Neck pain moves into left shoulder and has gone down into my hand.  Pt reports he has been going to [facility] PT for a little while referred over there for his cervical and shoulder issues  but admits not being consistent  and not completing HEP. He has not seen any positive results.  Pain started about 2 years ago and has progressively gotten worse.  Was referred over to Dr D. Brooks for assessment on possible surgical intervention  PERTINENT HISTORY:  56 y.o. male with a hx of acute myocarditis, DVT, hypertension, prediabetes, GERD, and sleep apnea on CPAP,  enlarged aorta .  Low back with sciatica pain and cervical neck pain.  Cervical stenosis;  KNEE ARTHROSCOPY WITH MEDIAL MENISECTOMY Right 09/26/2018     PAIN:  Are you having pain? Yes: NPRS scale: current 2/10 Pain location: neck and low back Pain description: pins and needles, ache; radiating Aggravating factors: standing with AD"s Relieving factors: lying down/sleeping   PRECAUTIONS: Cervical  WEIGHT BEARING RESTRICTIONS No  FALLS:  Has patient fallen in last 6 months? 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 Mild progression of central disc protrusion C3-4. Mild spinal stenosis and mild foraminal stenosis bilaterally similar to the prior study 2. Moderate spinal stenosis with moderate foraminal stenosis bilaterally at C4-5 and C5-6 unchanged 3. Mild foraminal narrowing bilaterally C6-7 unchanged. 03/29/21 Lumbar spine MRI: Mild degenerative changes as detailed above  PATIENT SURVEYS:  FOTO 47 with goal of 55  SCREENING FOR RED FLAGS:  Neg COGNITION:  Overall cognitive status: Within functional limits for tasks assessed      TODAY'S TREATMENT   10/31  STM bilat C/S paraspinals C2-6 Manual traction Manual traction with chin nod 10x  Exercises -seated pelvic tilt 20x (unable to perform supine due to lack of motor control) - supine Cervical Retraction  - 5-6  x daily - 7 x weekly - 1 sets - 5-10 reps -Rowing blue TB 2x10 - bridge with PPT 2s 3x10  Previous:  Pt seen for aquatic therapy today.  Treatment took place in water 3.25-4.5 ft in depth at the [place]. Temp of water was 92.  Pt entered/exited  the pool via stairs with hand rail.   Walking forward, back, sidestepping in 4.51f Holding to wall 4 ft: hip openers/crosses, hip extension; hip abdct; squats; heel raises; leg swings  Back near wall:  bilat shoulder horiz abdct/add holding yellow hand buoys; push downs at side for lower trap/core; levator stretch L/R/L Forward walking with horz head movements for neck ROM Stretching at steps: bilat calf (heels off of step), hamstring with DF/PF of foot; fig 4   Pt requires the buoyancy and hydrostatic pressure of water for support, and to offload joints by unweighting joint load by at least 50 % in navel deep water and by at least 75-80% in chest to neck deep water.  Viscosity of the water is needed for resistance of strengthening. Water current perturbations provides challenge to standing balance requiring increased core activation.     PATIENT EDUCATION:  Education details: anatomy, exercise progressions, centralization vs peripherilization, HEP, POC  Person educated: Patient Education method: Explanation Education comprehension: verbalized understanding   HOME EXERCISE PROGRAM:  Access Code: [email or web address]: [email or web address] Date: 12/23/2021 Prepared by: ADaleen Bo ASSESSMENT:  CLINICAL IMPRESSION: Pt able to continue with progression of C/S mobility and postural strength following manual therapy without increase in L UE irritation or weakness. Pt did not have increase in L LE radiating pain during session. Pt does have difficulty with control lumbopelvic motion and has difficulty getting into lumbar flexion and extension activity. Pt given edu about exercise activity in the gym setting and  encouraged to trial graded exposure to gym exercise as long as there is no increase in pain at the neck or back. Plan to continue with land and aquatic approach with land focused on transitioning to gym based strengthening for back and upper quarter. Pt would benefit from continued skilled therapy in order to reach goals and maximize functional UE and lumbopelvic strength and ROM for full return to PLOF.     OBJECTIVE IMPAIRMENTS cardiopulmonary status limiting activity, difficulty walking, decreased ROM, decreased strength, impaired flexibility, impaired sensation, and pain.   ACTIVITY LIMITATIONS carrying, lifting, bending, sitting, standing, squatting, stairs, and reach over head  PARTICIPATION LIMITATIONS: meal prep, cleaning, laundry, shopping, community activity, occupation, and yard work  PERSONAL FACTORS HTN, aortic aneurism  are also affecting patient's functional outcome.   REHAB POTENTIAL: Good  CLINICAL DECISION MAKING: Unstable/unpredictable  EVALUATION COMPLEXITY: High   GOALS: Goals reviewed with patient? Yes  SHORT TERM GOALS: Target date: 01/24/2022  Pt reports compliance with HEP Baseline:not compliant Goal status: INITIAL  2.  Left shoulder strength to be tested using dynamometer '@land'$  based appointment Baseline: MMT completed Goal status: INITIAL  3.  Pt will have decreased frequency of P&N/paresthesias into left foot. 5/7days Baseline: 7/7days Goal status: INITIAL  4.  Pt will report decrease in pain sx after aquatic session lasting at least a few hours. Baseline: ~3-4 constant throughout day Goal status: INITIAL   LONG TERM GOALS: Target date: 02/21/2022  Pt to meet FOTO goal of 55 Baseline: 47 Goal status: INITIAL  2.  Pt to be indep with final aquatic/land based HEP Baseline:  Goal status: INITIAL  3.  Pt will be able to complete standing ADL's without pain Baseline: pain Goal status: INITIAL  4.  Max pain to decrease to 5/10 or< to  demonstrate improved management of and controlled pain Baseline: 8/10 Goal status: INITIAL    PLAN: PT FREQUENCY: 1-2x/week  PT DURATION: 8 weeks  PLANNED INTERVENTIONS: Therapeutic exercises, Therapeutic activity, Neuromuscular re-education, Balance  training, Gait training, Patient/Family education, Self Care, Joint mobilization, Joint manipulation, Stair training, Aquatic Therapy, Dry Needling, Electrical stimulation, Spinal manipulation, Spinal mobilization, Cryotherapy, Moist heat, Taping, Manual therapy, and Re-evaluation.  PLAN FOR NEXT SESSION: Pt to be seen initially both land based addressing cervical and shoulder and aquatic addressing core and LE.  Will progress as appropriate  Daleen Bo PT, DPT 12/27/21 9:02 AM

## 2021-12-28 ENCOUNTER — Encounter (HOSPITAL_BASED_OUTPATIENT_CLINIC_OR_DEPARTMENT_OTHER): Payer: Self-pay | Admitting: Physical Therapy

## 2021-12-28 ENCOUNTER — Ambulatory Visit (HOSPITAL_BASED_OUTPATIENT_CLINIC_OR_DEPARTMENT_OTHER): Payer: BC Managed Care – PPO | Attending: Cardiovascular Disease | Admitting: Physical Therapy

## 2021-12-28 DIAGNOSIS — M5412 Radiculopathy, cervical region: Secondary | ICD-10-CM | POA: Insufficient documentation

## 2021-12-28 DIAGNOSIS — M542 Cervicalgia: Secondary | ICD-10-CM | POA: Diagnosis present

## 2021-12-28 DIAGNOSIS — M6281 Muscle weakness (generalized): Secondary | ICD-10-CM | POA: Insufficient documentation

## 2021-12-28 DIAGNOSIS — M5432 Sciatica, left side: Secondary | ICD-10-CM | POA: Insufficient documentation

## 2021-12-28 NOTE — Therapy (Signed)
OUTPATIENT PHYSICAL THERAPY THORACOLUMBAR TREATMENT   Patient Name: Gregory Klein MRN: [id number] DOB:1966-01-12, 56 y.o., male Today's Date: 12/28/2021   PT End of Session - 12/28/21 0813     Visit Number 6    Number of Visits 16    Date for PT Re-Evaluation 01/20/22    Authorization Type BC/BS    PT Start Time 0815    PT Stop Time 0900    PT Time Calculation (min) 45 min    Activity Tolerance Patient tolerated treatment well    Behavior During Therapy [facility] for tasks assessed/performed                Past Medical History:  Diagnosis Date   Achilles tendonitis, bilateral 2016   [name], DPM   Acute medial meniscus tear of right knee    Acute myocarditis 2015   viral vs d/t strep infection, resolved w/o sequela   Allergy    Ascending aortic aneurysm ([place]) 11/02/2021   Deformity of metatarsal    Bilateral, mild DJD at the right 1st MTP joint and bilateral decrease in calcaneal inclination angle   DVT (deep venous thrombosis) (HCC)    GERD (gastroesophageal reflux disease)    Hemorrhoids    hx of   Hepatitis B infection    hx of dx 2005 aprox   Hyperglycemia 02/22/2015   Hypertension    Neck pain    local injections x 2, las summer 2011 (Dr.Vortek)   Plantar fasciitis, bilateral 2016   [name], DPM   Sleep apnea    on CPAP    Past Surgical History:  Procedure Laterality Date   INGUINAL HERNIA REPAIR     KNEE ARTHROSCOPY  6-15   Dr Mardelle Matte   KNEE ARTHROSCOPY WITH MEDIAL MENISECTOMY Right 09/26/2018   LEFT HEART CATHETERIZATION WITH CORONARY ANGIOGRAM N/A 12/29/2013   Procedure: LEFT HEART CATHETERIZATION WITH CORONARY ANGIOGRAM;  Surgeon: [name], MD;  Location: [facility] CATH LAB;  Service: Cardiovascular;  Laterality: N/A;   Patient Active Problem List   Diagnosis Date Noted   Ascending aortic aneurysm ([place]) 11/02/2021   Hyperglycemia 02/22/2015   PCP NOTES >>> 12/28/2014   OSA on CPAP 07/21/2014   Myocarditis ([place])    Hypertension    GERD  (gastroesophageal reflux disease)    Hepatitis B infection    Neck pain--Chronic 11/10/2013   Annual physical exam 03/29/2011    PCP: Colon Branch, MD  REFERRING PROVIDER: [name], MD  REFERRING DIAG:  I10 (ICD-10-CM) - Primary hypertension  I71.21 (ICD-10-CM) - Aneurysm of ascending aorta without rupture [facility])    Rationale for Evaluation and Treatment Rehabilitation  THERAPY DIAG:  Sciatica, left side  Muscle weakness (generalized)  Cervicalgia  Radiculopathy, cervical region  ONSET DATE: 2 yrs  SUBJECTIVE:  SUBJECTIVE STATEMENT: Pt  states he was able to walk through grocery store with less pain this w/e. Pain is centralized upon initiation of session left lb.   Pt states that he has been dealing with the neck pain for years. When the back pain started, he has issues with the R side of his neck. He had radiating pain into the R UE. He has it more in the L side currently. He had injections that have made minimal difference. The L arm feels heavy to lift. Reaching BHB causes L UT type pain. Pain is stress related while at work. Pain goes into the deltoid. He was not compliant with previous neck HEP. Will occasionally get hand NT. Will occasionally have R sided hand cramping while holding a fork. Reaching OH makes the L UT feel heavy "like theres a weight." Denies 5 D's 3N's.    From Eval: Left buttock pain with radicular sx LLE. Pain originated in right leg then switched to left, radiating pins and needles daily from butt cheek to foot . P&N occasionally when waking/sometimes in day comes and goes and sometimes there upon waking. Neck pain moves into left shoulder and has gone down into my hand.  Pt reports he has been going to [facility] PT for a little while referred over there for his  cervical and shoulder issues  but admits not being consistent and not completing HEP. He has not seen any positive results.  Pain started about 2 years ago and has progressively gotten worse.  Was referred over to Dr D. Brooks for assessment on possible surgical intervention  PERTINENT HISTORY:  56 y.o. male with a hx of acute myocarditis, DVT, hypertension, prediabetes, GERD, and sleep apnea on CPAP,  enlarged aorta .  Low back with sciatica pain and cervical neck pain.  Cervical stenosis;  KNEE ARTHROSCOPY WITH MEDIAL MENISECTOMY Right 09/26/2018     PAIN:  Are you having pain? Yes: NPRS scale: current 2/10 Pain location: neck and low back Pain description: pins and needles, ache; radiating Aggravating factors: standing with AD"s Relieving factors: lying down/sleeping   PRECAUTIONS: Cervical  WEIGHT BEARING RESTRICTIONS No  FALLS:  Has patient fallen in last 6 months? 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 Mild progression of central disc protrusion C3-4. Mild spinal stenosis and mild foraminal stenosis bilaterally similar to the prior study 2. Moderate spinal stenosis with moderate foraminal stenosis bilaterally at C4-5 and C5-6 unchanged 3. Mild foraminal narrowing bilaterally C6-7 unchanged. 03/29/21 Lumbar spine MRI: Mild degenerative changes as detailed above  PATIENT SURVEYS:  FOTO 47 with goal of 55  SCREENING FOR RED FLAGS:  Neg COGNITION:  Overall cognitive status: Within functional limits for tasks assessed      TODAY'S TREATMENT  12/28/21 Pt seen for aquatic therapy today.  Treatment took place in water 3.25-4.5 ft in depth at the [facility]. Temp of water was 92.  Pt  entered/exited the pool via stairs with hand rail.   Walking forward, back, sidestepping in 4.42f Core engagement with submerged yellow hand buoys walking forward and back Side lunge ue add/abd yellow hand buoys Holding to wall 4 ft: hip openers; hip extension; hip abdct;  heel raises;  L stretch; hamstring and gastroc stretch; piriformis  stretch at step STS from 3rd step (bottom) x10. Cues for abdominal bracing and iso glut squeeze with standing.  Standing bottom step: squats x 12 Seated on squoodle: pelvic tilts; hip hiking; rotation Standing ue support yellow hand buoy: leg swings .   Pt requires the buoyancy and hydrostatic pressure of water for support, and to offload joints by unweighting joint load by at least 50 % in navel deep water and by at least 75-80% in chest to neck deep water.  Viscosity of the water is needed for resistance of strengthening. Water current perturbations provides challenge to standing balance requiring increased core activation.    10/31  STM bilat C/S paraspinals C2-6 Manual traction Manual traction with chin nod 10x  Exercises -seated pelvic tilt 20x (unable to perform supine due to lack of motor control) - supine Cervical Retraction  - 5-6 x daily - 7 x weekly - 1 sets - 5-10 reps -Rowing blue TB 2x10 - bridge with PPT 2s 3x10   PATIENT EDUCATION:  Education details: anatomy, exercise progressions, centralization vs peripherilization, HEP, POC  Person educated: Patient Education method: Explanation Education comprehension: verbalized understanding   HOME EXERCISE PROGRAM:  Access Code: [email or web address] URL: [email or web address] Date: 12/23/2021 Prepared by: Daleen Bo  ASSESSMENT:  CLINICAL IMPRESSION: Cues and demo for abdominal bracing for improved core control and strength throughout session.  Pt with good coordination of anterior pelvic tilts. He does have difficulty with SLS on left with rle movement. Reports eing able to  tolerate standing and walking with improvement over weekend. Goals ongoing.     OBJECTIVE IMPAIRMENTS cardiopulmonary status limiting activity, difficulty walking, decreased ROM, decreased strength, impaired flexibility, impaired sensation, and pain.   ACTIVITY LIMITATIONS carrying, lifting, bending, sitting, standing, squatting, stairs, and reach over head  PARTICIPATION LIMITATIONS: meal prep, cleaning, laundry, shopping, community activity, occupation, and yard work  PERSONAL FACTORS HTN, aortic aneurism  are also affecting patient's functional outcome.   REHAB POTENTIAL: Good  CLINICAL DECISION MAKING: Unstable/unpredictable  EVALUATION COMPLEXITY: High   GOALS: Goals reviewed with patient? Yes  SHORT TERM GOALS: Target date: 01/25/2022  Pt reports compliance with HEP Baseline:not compliant Goal status: INITIAL  2.  Left shoulder strength to be tested using dynamometer '@land'$  based appointment Baseline: MMT completed Goal status: INITIAL  3.  Pt will have decreased frequency of P&N/paresthesias into left foot. 5/7days Baseline: 7/7days Goal status: INITIAL  4.  Pt will report decrease in pain sx after aquatic session lasting at least a few hours. Baseline: ~3-4 constant throughout day Goal status: INITIAL   LONG TERM GOALS: Target date: 02/22/2022  Pt to meet FOTO goal of 55 Baseline: 47 Goal status: INITIAL  2.  Pt to be indep with final aquatic/land based HEP Baseline:  Goal status: INITIAL  3.  Pt will be able to complete standing ADL's without pain Baseline: pain Goal status: INITIAL  4.  Max pain to decrease to 5/10 or< to demonstrate improved management of and controlled pain Baseline: 8/10 Goal status: INITIAL    PLAN: PT FREQUENCY: 1-2x/week  PT DURATION: 8 weeks  PLANNED INTERVENTIONS: Therapeutic exercises, Therapeutic activity, Neuromuscular re-education, Balance training, Gait training, Patient/Family education, Self Care, Joint  mobilization, Joint manipulation, Stair training, Aquatic Therapy, Dry Needling, Electrical stimulation, Spinal manipulation, Spinal mobilization, Cryotherapy, Moist heat, Taping, Manual therapy, and Re-evaluation.  PLAN FOR NEXT SESSION: Pt to be seen initially both land based addressing cervical and shoulder and aquatic addressing core and LE.  Will progress as appropriate  Stanton Kidney (  Frankie) Ileene Allie MPT 12/28/21 8:17 AM

## 2022-01-04 ENCOUNTER — Ambulatory Visit (HOSPITAL_BASED_OUTPATIENT_CLINIC_OR_DEPARTMENT_OTHER): Payer: BC Managed Care – PPO | Admitting: Physical Therapy

## 2022-01-06 ENCOUNTER — Encounter (HOSPITAL_BASED_OUTPATIENT_CLINIC_OR_DEPARTMENT_OTHER): Payer: BC Managed Care – PPO | Admitting: Physical Therapy

## 2022-01-10 ENCOUNTER — Ambulatory Visit (INDEPENDENT_AMBULATORY_CARE_PROVIDER_SITE_OTHER): Payer: BC Managed Care – PPO | Admitting: Internal Medicine

## 2022-01-10 ENCOUNTER — Encounter: Payer: Self-pay | Admitting: Internal Medicine

## 2022-01-10 VITALS — BP 126/74 | HR 70 | Temp 98.1°F | Resp 16 | Ht 71.0 in | Wt 196.5 lb

## 2022-01-10 DIAGNOSIS — I10 Essential (primary) hypertension: Secondary | ICD-10-CM

## 2022-01-10 DIAGNOSIS — E785 Hyperlipidemia, unspecified: Secondary | ICD-10-CM | POA: Diagnosis not present

## 2022-01-10 DIAGNOSIS — Z0000 Encounter for general adult medical examination without abnormal findings: Secondary | ICD-10-CM | POA: Diagnosis not present

## 2022-01-10 DIAGNOSIS — E559 Vitamin D deficiency, unspecified: Secondary | ICD-10-CM

## 2022-01-10 LAB — TSH: TSH: 1.18 u[IU]/mL (ref 0.35–5.50)

## 2022-01-10 LAB — LIPID PANEL
Cholesterol: 196 mg/dL (ref 0–200)
HDL: 45.5 mg/dL (ref >39.00)
LDL Cholesterol: 141 mg/dL — ABNORMAL HIGH (ref 0–99)
NonHDL: 150.3
Total CHOL/HDL Ratio: 4
Triglycerides: 47 mg/dL (ref 0.0–149.0)
VLDL: 9.4 mg/dL (ref 0.0–40.0)

## 2022-01-10 LAB — VITAMIN D 25 HYDROXY (VIT D DEFICIENCY, FRACTURES): VITD: 40.94 ng/mL (ref 30.00–100.00)

## 2022-01-10 LAB — PSA: PSA: 1.06 ng/mL (ref 0.10–4.00)

## 2022-01-10 NOTE — Progress Notes (Signed)
Subjective:    Patient ID: Gregory Klein, male    DOB: 03/06/1965, 56 y.o.   MRN: [id number]  DOS:  01/10/2022 Type of visit - description: CPX  Here for CPX Since the last office visit saw cardiology, notes reviewed. Last week had a URI, feels fully recuperated. Occasional constipation, managed with OTCs.  Review of Systems  Other than above, a 14 point review of systems is negative    Past Medical History:  Diagnosis Date   Achilles tendonitis, bilateral 2016   [name], DPM   Acute medial meniscus tear of right knee    Acute myocarditis 2015   viral vs d/t strep infection, resolved w/o sequela   Allergy    Ascending aortic aneurysm ([place]) 11/02/2021   Deformity of metatarsal    Bilateral, mild DJD at the right 1st MTP joint and bilateral decrease in calcaneal inclination angle   DVT (deep venous thrombosis) (HCC)    GERD (gastroesophageal reflux disease)    Hemorrhoids    hx of   Hepatitis B infection    hx of dx 2005 aprox   Hyperglycemia 02/22/2015   Hypertension    Neck pain    local injections x 2, las summer 2011 (Dr.Vortek)   Plantar fasciitis, bilateral 2016   [name], DPM   Sleep apnea    on CPAP     Past Surgical History:  Procedure Laterality Date   INGUINAL HERNIA REPAIR     KNEE ARTHROSCOPY  6-15   Dr Mardelle Matte   KNEE ARTHROSCOPY WITH MEDIAL MENISECTOMY Right 09/26/2018   LEFT HEART CATHETERIZATION WITH CORONARY ANGIOGRAM N/A 12/29/2013   Procedure: LEFT HEART CATHETERIZATION WITH CORONARY ANGIOGRAM;  Surgeon: [name], MD;  Location: [facility] CATH LAB;  Service: Cardiovascular;  Laterality: N/A;   Social History   Socioeconomic History   Marital status: Single    Spouse name: Not on file   Number of children: 0   Years of education: Not on file   Highest education level: Not on file  Occupational History   Occupation: Principal    Employer: [employer]  Tobacco Use   Smoking status: Never   Smokeless tobacco: Never   Vaping Use   Vaping Use: Never used  Substance and Sexual Activity   Alcohol use: No    Alcohol/week: 0.0 standard drinks of alcohol   Drug use: No   Sexual activity: Not on file  Other Topics Concern   Not on file  Social History Narrative    Lives by himself   Social Determinants of Health   Financial Resource Strain: Not on file  Food Insecurity: Not on file  Transportation Needs: Not on file  Physical Activity: Not on file  Stress: Not on file  Social Connections: Not on file  Intimate Partner Violence: Not on file     Current Outpatient Medications  Medication Instructions   aspirin EC 81 mg, Oral, Daily, Swallow whole.    carvedilol (COREG) 12.5 mg, Oral, 2 times daily   cetirizine (ZYRTEC) 10 mg, Oral, Daily   losartan (COZAAR) 50 mg, Oral, Daily   OVER THE COUNTER MEDICATION 1-2 drops, Both Eyes, As needed, "[place] for Dry Eyes"    polyethylene glycol (MIRALAX / GLYCOLAX) 17 g, Oral, Daily   Probiotic Product (PROBIOTIC DAILY PO) Oral   psyllium (METAMUCIL) 58.6 % powder 1 packet, Oral, As needed, Reported on 05/07/2015   valACYclovir (VALTREX) 1000 MG tablet TAKE 2 TABLETS BY MOUTH TWICE DAILY  AS NEEDED FOR OUTBREAK   Vitamin D 2,000 Units, Oral, Daily       Objective:   Physical Exam BP 126/74   Pulse 70   Temp 98.1 F (36.7 C) (Oral)   Resp 16   Ht '5\' 11"'$  (1.803 m)   Wt 196 lb 8 oz (89.1 kg)   SpO2 97%   BMI 27.41 kg/m  General: Well developed, NAD, BMI noted Neck: No  thyromegaly  HEENT:  Normocephalic . Face symmetric, atraumatic Lungs:  CTA B Normal respiratory effort, no intercostal retractions, no accessory muscle use. Heart: RRR,  no murmur.  Abdomen:  Not distended, soft, non-tender. No rebound or rigidity.   Lower extremities: no pretibial edema bilaterally DRE: No stools, sphincter tone normal, prostate normal Skin: Exposed areas without rash. Not pale. Not jaundice Neurologic:  alert & oriented X3.  Speech normal, gait  appropriate for age and unassisted Strength symmetric and appropriate for age.  Psych: Cognition and judgment appear intact.  Cooperative with normal attention span and concentration.  Behavior appropriate. No anxious or depressed appearing.     Assessment   ASSESSMENT Prediabetes A1C 5.9 (12-2014) HTN OSA on CPAP Vitamin D deficiency --DVT: 09/2018: R leg DVT 3 weeks after R knee arthoscopy. --Post thrombotic syndrome: had R leg edema ~ 09/2019 after a trip, [place]: chronic thrombus R popliteal, rec ASA and compression  stockings x life (see vascular note 10-10-2019) June 2023: Calcium coronary score: 0.  + Ascending aorta dilatation (4.0 cm).  Saw cardiology H/o cold sores , on valtrex  H/o Acute myocarditis --- 2015 , had a cath, problem related to strep, no sequela H/o Suspected cirrhosis, disproved by a negative biopsy 01/2014 H/o hep B infex 2015 H/o Neck pain, s/p 2 injections 2011 (Dr. Adline Peals), occ numbness R thumb,  Injection 01/2019 H/o Urolithiasis ER 04-30-25   PLAN Here for CPX Prediabetes: Last A1c satisfactory.  No change HTN, calcium coronary score 0, + Ascending aorta dilatation (4.0 cm) per CT June 2023: Saw cardiology MD and Pharm.D., BP goal set at <130/80, he was slightly above goal, was hesitant to change medication and elected to be more consistent with CPAP use and change his diet. No recent ambulatory BPs, BP today is great, no change. OSA, recommend consistent use of CPAP which he is doing Vitamin D deficiency: Not taking OTCs consistently, recommend to do so, check labs. RTC 6 months

## 2022-01-10 NOTE — Assessment & Plan Note (Signed)
Here for CPX Prediabetes: Last A1c satisfactory.  No change HTN, calcium coronary score 0, + Ascending aorta dilatation (4.0 cm) per CT June 2023: Saw cardiology MD and Pharm.D., BP goal set at <130/80, he was slightly above goal, was hesitant to change medication and elected to be more consistent with CPAP use and change his diet. No recent ambulatory BPs, BP today is great, no change. OSA, recommend consistent use of CPAP which he is doing Vitamin D deficiency: Not taking OTCs consistently, recommend to do so, check labs. RTC 6 months

## 2022-01-10 NOTE — Patient Instructions (Addendum)
Check the  blood pressure regularly BP GOAL is between 110/65 and 130/80 If it is consistently higher or lower, let me know  Take vitamin D supplements 2000 units every day  GO TO THE LAB : Get the blood work     [place], [place] back for a checkup in 6 months     Do you have a "Living will" or "[place] of attorney"? (Advance care planning documents)  If you already have a living will or healthcare power of attorney, is recommended you bring the copy to be scanned in your chart. The document will be available to all the doctors you see in the system.  If you don't have one, please consider create one.     More information at:  [email or web address]

## 2022-01-10 NOTE — Assessment & Plan Note (Signed)
-  Tdap 2019 - s/p shingrix  x 2  - PNA (23): 12/28/13  -Covid vaccine  booster : pro>cons  -  flu shot  declines today -[facility]- 04/17/02;  cscope 07/2017 + polyp, 5 years -Prostate cancer screening: F had prost Ca age 56,  DRE normal today.  Check PSA diet,exercise: Discussed -Labs: FLP TSH, PSA, vit D - POA d/w pt

## 2022-01-11 ENCOUNTER — Ambulatory Visit (HOSPITAL_BASED_OUTPATIENT_CLINIC_OR_DEPARTMENT_OTHER): Payer: BC Managed Care – PPO | Admitting: Physical Therapy

## 2022-01-11 DIAGNOSIS — M5432 Sciatica, left side: Secondary | ICD-10-CM | POA: Diagnosis not present

## 2022-01-11 DIAGNOSIS — M6281 Muscle weakness (generalized): Secondary | ICD-10-CM

## 2022-01-11 NOTE — Therapy (Signed)
OUTPATIENT PHYSICAL THERAPY THORACOLUMBAR TREATMENT   Patient Name: Gregory Klein MRN: [id number] DOB:[birth year], 56 y.o., male Today's Date: 01/11/2022   PT End of Session - 01/11/22 0738     Visit Number 7    Number of Visits 16    Date for PT Re-Evaluation 01/20/22    Authorization Type BC/BS    PT Start Time 0730    PT Stop Time 0815    PT Time Calculation (min) 45 min    Activity Tolerance Patient tolerated treatment well    Behavior During Therapy [facility] for tasks assessed/performed                Past Medical History:  Diagnosis Date   Achilles tendonitis, bilateral 2016   [name], DPM   Acute medial meniscus tear of right knee    Acute myocarditis 2015   viral vs d/t strep infection, resolved w/o sequela   Allergy    Ascending aortic aneurysm ([place]) 11/02/2021   Deformity of metatarsal    Bilateral, mild DJD at the right 1st MTP joint and bilateral decrease in calcaneal inclination angle   DVT (deep venous thrombosis) ([place])    GERD (gastroesophageal reflux disease)    Hemorrhoids    hx of   Hepatitis B infection    hx of dx 2005 aprox   Hyperglycemia 02/22/2015   Hypertension    Neck pain    local injections x 2, las summer 2011 (Dr.Vortek)   Plantar fasciitis, bilateral 2016   [name], DPM   Sleep apnea    on CPAP    Past Surgical History:  Procedure Laterality Date   INGUINAL HERNIA REPAIR     KNEE ARTHROSCOPY  6-15   Dr Mardelle Matte   KNEE ARTHROSCOPY WITH MEDIAL MENISECTOMY Right 09/26/2018   LEFT HEART CATHETERIZATION WITH CORONARY ANGIOGRAM N/A 12/29/2013   Procedure: LEFT HEART CATHETERIZATION WITH CORONARY ANGIOGRAM;  Surgeon: [name], MD;  Location: [facility] CATH LAB;  Service: Cardiovascular;  Laterality: N/A;   Patient Active Problem List   Diagnosis Date Noted   Ascending aortic aneurysm ([place]) 11/02/2021   Hyperglycemia 02/22/2015   PCP NOTES >>> 12/28/2014   OSA on CPAP 07/21/2014   Myocarditis ([place])    Hypertension     GERD (gastroesophageal reflux disease)    Hepatitis B infection    Neck pain--Chronic 11/10/2013   Annual physical exam 03/29/2011    PCP: Colon Branch, MD  REFERRING PROVIDER: [name], MD  REFERRING DIAG:  I10 (ICD-10-CM) - Primary hypertension  I71.21 (ICD-10-CM) - Aneurysm of ascending aorta without rupture ([place])    Rationale for Evaluation and Treatment Rehabilitation  THERAPY DIAG:  Sciatica, left side  Muscle weakness (generalized)  ONSET DATE: 2 yrs  SUBJECTIVE:  SUBJECTIVE STATEMENT: Pt  states his overall pain has decreased. Still has daily but not as noticeable through his work day. "This is definitely helping"   Pt states that he has been dealing with the neck pain for years. When the back pain started, he has issues with the R side of his neck. He had radiating pain into the R UE. He has it more in the L side currently. He had injections that have made minimal difference. The L arm feels heavy to lift. Reaching BHB causes L UT type pain. Pain is stress related while at work. Pain goes into the deltoid. He was not compliant with previous neck HEP. Will occasionally get hand NT. Will occasionally have R sided hand cramping while holding a fork. Reaching OH makes the L UT feel heavy "like theres a weight." Denies 5 D's 3N's.    From Eval: Left buttock pain with radicular sx LLE. Pain originated in right leg then switched to left, radiating pins and needles daily from butt cheek to foot . P&N occasionally when waking/sometimes in day comes and goes and sometimes there upon waking. Neck pain moves into left shoulder and has gone down into my hand.  Pt reports he has been going to [facility] PT for a little while referred over there for his cervical and shoulder issues  but admits not  being consistent and not completing HEP. He has not seen any positive results.  Pain started about 2 years ago and has progressively gotten worse.  Was referred over to Dr D. Brooks for assessment on possible surgical intervention  PERTINENT HISTORY:  56 y.o. male with a hx of acute myocarditis, DVT, hypertension, prediabetes, GERD, and sleep apnea on CPAP,  enlarged aorta .  Low back with sciatica pain and cervical neck pain.  Cervical stenosis;  KNEE ARTHROSCOPY WITH MEDIAL MENISECTOMY Right 09/26/2018     PAIN:  Are you having pain? Yes: NPRS scale: current 2/10 Pain location: neck and low back Pain description: pins and needles, ache; radiating Aggravating factors: standing with AD"s Relieving factors: lying down/sleeping   PRECAUTIONS: Cervical  WEIGHT BEARING RESTRICTIONS No  FALLS:  Has patient fallen in last 6 months? 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 Mild progression of central disc protrusion C3-4. Mild spinal stenosis and mild foraminal stenosis bilaterally similar to the prior study 2. Moderate spinal stenosis with moderate foraminal stenosis bilaterally at C4-5 and C5-6 unchanged 3. Mild foraminal narrowing bilaterally C6-7 unchanged. 03/29/21 Lumbar spine MRI: Mild degenerative changes as detailed above  PATIENT SURVEYS:  FOTO 47 with goal of 55  SCREENING FOR RED FLAGS:  Neg COGNITION:  Overall cognitive status: Within functional limits for tasks assessed      TODAY'S TREATMENT  12/28/21 Pt seen for aquatic therapy today.  Treatment took place in water 3.25-4.5 ft in depth at the [facility]. Temp of water was 92.  Pt entered/exited the pool via stairs with hand  rail.   Walking forward, back, sidestepping in 4.14f Stretching at steps/railing:L stretch; hamstring, gastroc; hip ER; piriformis Seated on squoodle: pelvic tilts; hip hiking; rotation Core engagement with submerged yellow hand buoys walking forward and back Side lunge ue add/abd yellow hand buoys TrA isometric: pushing kick board 4-5 inches  into chest deep water x5 for 20s Kick board row feet staggered R/L then feet together 2x15 ea position Lumbar rotation Walking throughout session for stretching and recovery   Pt requires the buoyancy and hydrostatic pressure of water for support, and to offload joints by unweighting joint load by at least 50 % in navel deep water and by at least 75-80% in chest to neck deep water.  Viscosity of the water is needed for resistance of strengthening. Water current perturbations provides challenge to standing balance requiring increased core activation.    10/31  STM bilat C/S paraspinals C2-6 Manual traction Manual traction with chin nod 10x  Exercises -seated pelvic tilt 20x (unable to perform supine due to lack of motor control) - supine Cervical Retraction  - 5-6 x daily - 7 x weekly - 1 sets - 5-10 reps -Rowing blue TB 2x10 - bridge with PPT 2s 3x10   PATIENT EDUCATION:  Education details: anatomy, exercise progressions, centralization vs peripherilization, HEP, POC  Person educated: Patient Education method: Explanation Education comprehension: verbalized understanding   HOME EXERCISE PROGRAM:  Access Code: [email or web address] URL: [email or web address] Date: 12/23/2021 Prepared by: Daleen Bo  ASSESSMENT:  CLINICAL IMPRESSION: Pt able to tolerate progression of program increasing reps and core strengthening intensity.  He again is able to complete PPT well seated on noodle.He reports feeling core strengthening well with increased ms engagement in glutes bilaterally. Progressing well with decreasing pain. Ended in [place] for  water massage (unbilled). He will continue to benefit from skilled PT land and aquatic ased to imrpove functional mobility improve strength and decrease pain.      OBJECTIVE IMPAIRMENTS cardiopulmonary status limiting activity, difficulty walking, decreased ROM, decreased strength, impaired flexibility, impaired sensation, and pain.   ACTIVITY LIMITATIONS carrying, lifting, bending, sitting, standing, squatting, stairs, and reach over head  PARTICIPATION LIMITATIONS: meal prep, cleaning, laundry, shopping, community activity, occupation, and yard work  PERSONAL FACTORS HTN, aortic aneurism  are also affecting patient's functional outcome.   REHAB POTENTIAL: Good  CLINICAL DECISION MAKING: Unstable/unpredictable  EVALUATION COMPLEXITY: High   GOALS: Goals reviewed with patient? Yes  SHORT TERM GOALS: Target date: 02/08/2022  Pt reports compliance with HEP Baseline:not compliant Goal status: INITIAL  2.  Left shoulder strength to be tested using dynamometer '@land'$  based appointment Baseline: MMT completed Goal status: INITIAL  3.  Pt will have decreased frequency of P&N/paresthesias into left foot. 5/7days Baseline: 7/7days Goal status: INITIAL  4.  Pt will report decrease in pain sx after aquatic session lasting at least a few hours. Baseline: ~3-4 constant throughout day Goal status: INITIAL   LONG TERM GOALS: Target date: 03/08/2022  Pt to meet FOTO goal of 55 Baseline: 47 Goal status: INITIAL  2.  Pt to be indep with final aquatic/land based HEP Baseline:  Goal status: INITIAL  3.  Pt will be able to complete standing ADL's without pain Baseline: pain Goal status: INITIAL  4.  Max pain to decrease to 5/10 or< to demonstrate improved management of and controlled pain Baseline: 8/10 Goal status: INITIAL    PLAN: PT FREQUENCY: 1-2x/week  PT DURATION: 8 weeks  PLANNED INTERVENTIONS: Therapeutic exercises, Therapeutic activity, Neuromuscular  re-education, Balance training, Gait training, Patient/Family education, Self Care, Joint mobilization, Joint manipulation, Stair training, Aquatic Therapy, Dry Needling, Electrical stimulation, Spinal manipulation, Spinal mobilization, Cryotherapy, Moist heat, Taping, Manual therapy, and Re-evaluation.  PLAN FOR NEXT SESSION: Pt to be seen initially both land based addressing cervical and shoulder and aquatic addressing core and  LE.  Will progress as appropriate  Stanton Kidney Tharon Aquas) Emersyn Kotarski MPT 01/11/22 7:42 AM

## 2022-01-13 ENCOUNTER — Ambulatory Visit (HOSPITAL_BASED_OUTPATIENT_CLINIC_OR_DEPARTMENT_OTHER): Payer: BC Managed Care – PPO | Admitting: Physical Therapy

## 2022-01-18 ENCOUNTER — Encounter (HOSPITAL_BASED_OUTPATIENT_CLINIC_OR_DEPARTMENT_OTHER): Payer: Self-pay | Admitting: Physical Therapy

## 2022-01-18 ENCOUNTER — Ambulatory Visit (HOSPITAL_BASED_OUTPATIENT_CLINIC_OR_DEPARTMENT_OTHER): Payer: BC Managed Care – PPO | Admitting: Physical Therapy

## 2022-01-18 DIAGNOSIS — M5432 Sciatica, left side: Secondary | ICD-10-CM

## 2022-01-18 DIAGNOSIS — M5412 Radiculopathy, cervical region: Secondary | ICD-10-CM

## 2022-01-18 DIAGNOSIS — M542 Cervicalgia: Secondary | ICD-10-CM

## 2022-01-18 DIAGNOSIS — M6281 Muscle weakness (generalized): Secondary | ICD-10-CM

## 2022-01-18 NOTE — Therapy (Signed)
OUTPATIENT PHYSICAL THERAPY THORACOLUMBAR TREATMENT   Patient Name: Gregory Klein MRN: [id number] DOB:01-20-1966, 56 y.o., male Today's Date: 01/18/2022   PT End of Session - 01/18/22 0819     Visit Number 8    Number of Visits 16    Date for PT Re-Evaluation 01/20/22    Authorization Type BC/BS    PT Start Time 0815    PT Stop Time 8110    PT Time Calculation (min) 40 min    Activity Tolerance Patient tolerated treatment well    Behavior During Therapy [facility] for tasks assessed/performed                Past Medical History:  Diagnosis Date   Achilles tendonitis, bilateral 2016   [name], DPM   Acute medial meniscus tear of right knee    Acute myocarditis 2015   viral vs d/t strep infection, resolved w/o sequela   Allergy    Ascending aortic aneurysm ([place]) 11/02/2021   Deformity of metatarsal    Bilateral, mild DJD at the right 1st MTP joint and bilateral decrease in calcaneal inclination angle   DVT (deep venous thrombosis) ([facility])    GERD (gastroesophageal reflux disease)    Hemorrhoids    hx of   Hepatitis B infection    hx of dx 2005 aprox   Hyperglycemia 02/22/2015   Hypertension    Neck pain    local injections x 2, las summer 2011 (Dr.Vortek)   Plantar fasciitis, bilateral 2016   [name], DPM   Sleep apnea    on CPAP    Past Surgical History:  Procedure Laterality Date   INGUINAL HERNIA REPAIR     KNEE ARTHROSCOPY  6-15   Dr Mardelle Matte   KNEE ARTHROSCOPY WITH MEDIAL MENISECTOMY Right 09/26/2018   LEFT HEART CATHETERIZATION WITH CORONARY ANGIOGRAM N/A 12/29/2013   Procedure: LEFT HEART CATHETERIZATION WITH CORONARY ANGIOGRAM;  Surgeon: [name], MD;  Location: [facility] CATH LAB;  Service: Cardiovascular;  Laterality: N/A;   Patient Active Problem List   Diagnosis Date Noted   Ascending aortic aneurysm ([place]) 11/02/2021   Hyperglycemia 02/22/2015   PCP NOTES >>> 12/28/2014   OSA on CPAP 07/21/2014   Myocarditis ([place])    Hypertension     GERD (gastroesophageal reflux disease)    Hepatitis B infection    Neck pain--Chronic 11/10/2013   Annual physical exam 03/29/2011    PCP: Colon Branch, MD  REFERRING PROVIDER: [name], MD  REFERRING DIAG:  I10 (ICD-10-CM) - Primary hypertension  I71.21 (ICD-10-CM) - Aneurysm of ascending aorta without rupture [facility])    Rationale for Evaluation and Treatment Rehabilitation  THERAPY DIAG:  Sciatica, left side  Muscle weakness (generalized)  Cervicalgia  Radiculopathy, cervical region  ONSET DATE: 2 yrs  SUBJECTIVE:  SUBJECTIVE STATEMENT: Pt reports he hasn't had any radicular symptoms in 3 wks.  He has noticed more tolerance for grocery shopping, ironing and washing dishes.  He is considering joining [employer] upon completion of PT.    From Eval: Left buttock pain with radicular sx LLE. Pain originated in right leg then switched to left, radiating pins and needles daily from butt cheek to foot . P&N occasionally when waking/sometimes in day comes and goes and sometimes there upon waking. Neck pain moves into left shoulder and has gone down into my hand.  Pt reports he has been going to [facility] PT for a little while referred over there for his cervical and shoulder issues  but admits not being consistent and not completing HEP. He has not seen any positive results.  Pain started about 2 years ago and has progressively gotten worse.  Was referred over to Dr D. Brooks for assessment on possible surgical intervention  PERTINENT HISTORY:  56 y.o. male with a hx of acute myocarditis, DVT, hypertension, prediabetes, GERD, and sleep apnea on CPAP,  enlarged aorta .  Low back with sciatica pain and cervical neck pain.  Cervical stenosis;  KNEE ARTHROSCOPY WITH MEDIAL MENISECTOMY Right  09/26/2018     PAIN:  Are you having pain? Yes: NPRS scale: current 2/10 Pain location: neck and low back Pain description: pins and needles, ache; radiating Aggravating factors: standing with AD"s Relieving factors: lying down/sleeping   PRECAUTIONS: Cervical  WEIGHT BEARING RESTRICTIONS No  FALLS:  Has patient fallen in last 6 months? 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 Mild progression of central disc protrusion C3-4. Mild spinal stenosis and mild foraminal stenosis bilaterally similar to the prior study 2. Moderate spinal stenosis with moderate foraminal stenosis bilaterally at C4-5 and C5-6 unchanged 3. Mild foraminal narrowing bilaterally C6-7 unchanged. 03/29/21 Lumbar spine MRI: Mild degenerative changes as detailed above  PATIENT SURVEYS:  FOTO 47 with goal of 55  SCREENING FOR RED FLAGS:  Neg COGNITION:  Overall cognitive status: Within functional limits for tasks assessed      TODAY'S TREATMENT   Pt seen for aquatic therapy today.  Treatment took place in water 3.25-4.5 ft in depth at the [place]. Temp of water was 92.  Pt entered/exited the pool via stairs with hand rail.  -Walking forward, back, sidestepping in 4.28f -Wide stance holding yellow floats:  bilat shoulder addct/abct x 10; unilateral flex to ext (0-90) - TrA set -Wide stance holding yellow kickboard on side, submerged:  trunk rotation x 10 with each hand forward on board - TrA set, then hands flat on board with trunk rotation for ROM/stretch -Holding yellow hand floats:  forward walking split squats;  walking forward / backward with hand floats under water at side; 3 way leg kick with floats at surface -  Wall push ups x 10, push offs x 10 - Stretching at steps/railing 2x 15sec each: bilat calf stretch with heels off of step, L stretch with wag the tail; hamstring with foot on 3rd step; fig 4  Pt requires the buoyancy and hydrostatic pressure of water for support, and to offload joints by unweighting joint load by at least 50 % in navel deep water and by at least 75-80%  in chest to neck deep water.  Viscosity of the water is needed for resistance of strengthening. Water current perturbations provides challenge to standing balance requiring increased core activation.    10/31- previous land  STM bilat C/S paraspinals C2-6 Manual traction Manual traction with chin nod 10x  Exercises -seated pelvic tilt 20x (unable to perform supine due to lack of motor control) - supine Cervical Retraction  - 5-6 x daily - 7 x weekly - 1 sets - 5-10 reps -Rowing blue TB 2x10 - bridge with PPT 2s 3x10   PATIENT EDUCATION:  Education details: aquatic progressions/ modifications  Person educated: Patient Education method: Explanation Education comprehension: verbalized understanding   HOME EXERCISE PROGRAM:  Access Code: [email or web address] URL: [email or web address] Date: 12/23/2021 Prepared by: Daleen Bo  ASSESSMENT:  CLINICAL IMPRESSION: Pt tolerating increased resistance in water with TrA engagement exercises.  Overall, good improvement in pain level with ADLs.  Goals addressed today; has met STG#3 and 4, and is making good progress towards remaining goals. He will continue to benefit from skilled PT land and aquatic ased to improve functional mobility improve strength and decrease pain.   OBJECTIVE IMPAIRMENTS cardiopulmonary status limiting activity, difficulty walking, decreased ROM, decreased strength, impaired flexibility, impaired sensation, and pain.   ACTIVITY LIMITATIONS carrying, lifting, bending, sitting, standing, squatting, stairs, and reach over head  PARTICIPATION  LIMITATIONS: meal prep, cleaning, laundry, shopping, community activity, occupation, and yard work  PERSONAL FACTORS HTN, aortic aneurism  are also affecting patient's functional outcome.   REHAB POTENTIAL: Good  CLINICAL DECISION MAKING: Unstable/unpredictable  EVALUATION COMPLEXITY: High   GOALS: Goals reviewed with patient? Yes  SHORT TERM GOALS: Target date: 12/23/21  Pt reports compliance with HEP Baseline: pt reports inconsistent compliance  Goal status: Partially met -01/18/22  2.  Left shoulder strength to be tested using dynamometer _0  based appointment Baseline: MMT completed Goal status: INITIAL  3.  Pt will have decreased frequency of P&N/paresthesias into left foot. 5/7days Baseline: 0/7 days for 3 wks Goal status: Achieved - 01/18/22  4.  Pt will report decrease in pain sx after aquatic session lasting at least a few hours. Baseline:  Goal status: Achieved - 01/18/22   LONG TERM GOALS: Target date: 01/20/22  Pt to meet FOTO goal of 55 Baseline: 47 Goal status: INITIAL  2.  Pt to be indep with final aquatic/land based HEP Baseline:  Goal status: Ongoing   3.  Pt will be able to complete standing ADL's without pain Baseline: 4/10 pain with ironing and grocery shopping  Goal status: Ongoing - 01/18/22  4.  Max pain to decrease to 5/10 or< to demonstrate improved management of and controlled pain Baseline: 6/10 max  Goal status:Ongoing - 01/18/22    PLAN: PT FREQUENCY: 1-2x/week  PT DURATION: 8 weeks  PLANNED INTERVENTIONS: Therapeutic exercises, Therapeutic activity, Neuromuscular re-education, Balance training, Gait training, Patient/Family education, Self Care, Joint mobilization, Joint manipulation, Stair training, Aquatic Therapy, Dry Needling, Electrical stimulation, Spinal manipulation, Spinal mobilization, Cryotherapy, Moist heat, Taping, Manual therapy, and Re-evaluation.  PLAN FOR NEXT SESSION: recert/ Lavonia Dana,  PTA 01/18/22 9:00 AM [facility] [place], [place], [address] Phone: [phone]   Fax:  [phone]

## 2022-01-30 ENCOUNTER — Encounter (HOSPITAL_BASED_OUTPATIENT_CLINIC_OR_DEPARTMENT_OTHER): Payer: Self-pay | Admitting: Physical Therapy

## 2022-01-30 ENCOUNTER — Ambulatory Visit (HOSPITAL_BASED_OUTPATIENT_CLINIC_OR_DEPARTMENT_OTHER): Payer: BC Managed Care – PPO | Attending: Cardiovascular Disease | Admitting: Physical Therapy

## 2022-01-30 DIAGNOSIS — M6281 Muscle weakness (generalized): Secondary | ICD-10-CM | POA: Diagnosis present

## 2022-01-30 DIAGNOSIS — M5432 Sciatica, left side: Secondary | ICD-10-CM | POA: Insufficient documentation

## 2022-01-30 DIAGNOSIS — M5412 Radiculopathy, cervical region: Secondary | ICD-10-CM | POA: Insufficient documentation

## 2022-01-30 DIAGNOSIS — M542 Cervicalgia: Secondary | ICD-10-CM | POA: Insufficient documentation

## 2022-01-30 NOTE — Therapy (Signed)
OUTPATIENT PHYSICAL THERAPY THORACOLUMBAR TREATMENT/Progress note    Patient Name: Gregory Klein MRN: [id number] DOB:12/20/1965, 56 y.o., male Today's Date: 01/18/2022   PT End of Session - 01/18/22 0819     Visit Number 8    Number of Visits 16    Date for PT Re-Evaluation 01/20/22    Authorization Type BC/BS    PT Start Time 0815    PT Stop Time 6644    PT Time Calculation (min) 40 min    Activity Tolerance Patient tolerated treatment well    Behavior During Therapy [facility] for tasks assessed/performed                Past Medical History:  Diagnosis Date   Achilles tendonitis, bilateral 2016   [name], DPM   Acute medial meniscus tear of right knee    Acute myocarditis 2015   viral vs d/t strep infection, resolved w/o sequela   Allergy    Ascending aortic aneurysm ([place]) 11/02/2021   Deformity of metatarsal    Bilateral, mild DJD at the right 1st MTP joint and bilateral decrease in calcaneal inclination angle   DVT (deep venous thrombosis) (HCC)    GERD (gastroesophageal reflux disease)    Hemorrhoids    hx of   Hepatitis B infection    hx of dx 2005 aprox   Hyperglycemia 02/22/2015   Hypertension    Neck pain    local injections x 2, las summer 2011 (Dr.Vortek)   Plantar fasciitis, bilateral 2016   [name], DPM   Sleep apnea    on CPAP    Past Surgical History:  Procedure Laterality Date   INGUINAL HERNIA REPAIR     KNEE ARTHROSCOPY  6-15   Dr Mardelle Matte   KNEE ARTHROSCOPY WITH MEDIAL MENISECTOMY Right 09/26/2018   LEFT HEART CATHETERIZATION WITH CORONARY ANGIOGRAM N/A 12/29/2013   Procedure: LEFT HEART CATHETERIZATION WITH CORONARY ANGIOGRAM;  Surgeon: [name], MD;  Location: [facility] CATH LAB;  Service: Cardiovascular;  Laterality: N/A;   Patient Active Problem List   Diagnosis Date Noted   Ascending aortic aneurysm ([place]) 11/02/2021   Hyperglycemia 02/22/2015   PCP NOTES >>> 12/28/2014   OSA on CPAP 07/21/2014   Myocarditis ([place])     Hypertension    GERD (gastroesophageal reflux disease)    Hepatitis B infection    Neck pain--Chronic 11/10/2013   Annual physical exam 03/29/2011    PCP: Colon Branch, MD  REFERRING PROVIDER: [name], MD  REFERRING DIAG:  I10 (ICD-10-CM) - Primary hypertension  I71.21 (ICD-10-CM) - Aneurysm of ascending aorta without rupture [facility])    Rationale for Evaluation and Treatment Rehabilitation  THERAPY DIAG:  Sciatica, left side  Muscle weakness (generalized)  Cervicalgia  Radiculopathy, cervical region  ONSET DATE: 2 yrs  SUBJECTIVE:  SUBJECTIVE STATEMENT: The patient reports that his pain on the left side is better but now he is having pain on the right side.  He reports overall that he is able to walk in the grocery store with less pain and he is having less pain when he is sitting and standing.  Today he he is having pain in his left upper trap he reports this is frequent has been going on for some time.  He feels like the pool is helping.  From Eval: Left buttock pain with radicular sx LLE. Pain originated in right leg then switched to left, radiating pins and needles daily from butt cheek to foot . P&N occasionally when waking/sometimes in day comes and goes and sometimes there upon waking. Neck pain moves into left shoulder and has gone down into my hand.  Pt reports he has been going to [facility] for a little while referred over there for his cervical and shoulder issues  but admits not being consistent and not completing HEP. He has not seen any positive results.  Pain started about 2 years ago and has progressively gotten worse.  Was referred over to Dr D. Brooks for assessment on possible surgical intervention  PERTINENT HISTORY:  56 y.o. male with a hx of acute myocarditis,  DVT, hypertension, prediabetes, GERD, and sleep apnea on CPAP,  enlarged aorta .  Low back with sciatica pain and cervical neck pain.  Cervical stenosis;  KNEE ARTHROSCOPY WITH MEDIAL MENISECTOMY Right 09/26/2018     PAIN:  Are you having pain? Yes: NPRS scale: current 4/10 Pain location: Left upper trap Pain description: Aching Aggravating factors: Sitting for periods of time Relieving factors: lying down/sleeping   PRECAUTIONS: Cervical  WEIGHT BEARING RESTRICTIONS No  FALLS:  Has patient fallen in last 6 months? 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 Mild progression of central disc protrusion C3-4. Mild spinal stenosis and mild foraminal stenosis bilaterally similar to the prior study 2. Moderate spinal stenosis with moderate foraminal stenosis bilaterally at C4-5 and C5-6 unchanged 3. Mild foraminal narrowing bilaterally C6-7 unchanged. 03/29/21 Lumbar spine MRI: Mild degenerative changes as detailed above  PATIENT SURVEYS:  FOTO 47 with goal of 55  SCREENING FOR RED FLAGS:  Neg COGNITION:  Overall cognitive status: Within functional limits for tasks assessed     UE ROM/STRENGTH ROM: Left shoulder; elbow wfl STR: Left shoulder ~4-/5 (plan to measure using dynamometer @ land based appointment)         LOWER EXTREMITY ROM:      Active  Right eval Left eval  Hip flexion 80 70 in supine  Hip extension 10 5d  Hip abduction      Hip adduction      Hip internal rotation wfl wfl  Hip external rotation Limited 25% Limited 50%  Knee flexion      Knee extension      Ankle dorsiflexion      Ankle plantarflexion      Ankle inversion      Ankle eversion       (Blank rows = WFL)   LOWER  EXTREMITY MMT:     MMT Right eval Left eval Right  12/4 Left 12/4  Hip flexion 4 4 5/ 38.3 4+/  31.4  Hip extension 4- 4-    Hip abduction 4 4 5/48.3 5/49.7  Hip adduction 5 5    Hip internal rotation wfl wfl    Hip external rotation wfl Limited no P!    Knee flexion 5 5    Knee extension 5 5 53.4 62.8  Ankle dorsiflexion        Ankle plantarflexion        Ankle inversion        Ankle eversion         (Blank rows = WFL)   LUMBAR SPECIAL TESTS:  Straight leg raise test: Negative, Slump test: Positive, and Thomas test: Negative  Cervical rotation L 65  Right 70         TODAY'S TREATMENT  12/4  Reviewed testing measures including strength and range of motion. Self-care: Reviewed how to use his home exercise program reviewed this difference between stretches and exercises.  Talked with patient about what he thinks will be the best as far as him his ability to be able to do some of these activities.  Self soft tissue mobilization using Thera cane and tennis ball.  Upper trap stretch 2 x 20 seconds for left Gluteal stretch 3 x 20 seconds seated  Standing row green 3 x 10 with cueing for posture Shoulder extension 3 x 10 with cueing for posture    Last visit:Pt seen for aquatic therapy today.  Treatment took place in water 3.25-4.5 ft in depth at the [place]. Temp of water was 92.  Pt entered/exited the pool via stairs with hand rail.  -Walking forward, back, sidestepping in 4.57f -Wide stance holding yellow floats:  bilat shoulder addct/abct x 10; unilateral flex to ext (0-90) - TrA set -Wide stance holding yellow kickboard on side, submerged:  trunk rotation x 10 with each hand forward on board - TrA set, then hands flat on board with trunk rotation for ROM/stretch -Holding yellow hand floats:  forward walking split squats;  walking forward / backward with hand floats under water at side; 3 way leg kick with floats at surface - Wall push ups x 10,  push offs x 10 - Stretching at steps/railing 2x 15sec each: bilat calf stretch with heels off of step, L stretch with wag the tail; hamstring with foot on 3rd step; fig 4  Pt requires the buoyancy and hydrostatic pressure of water for support, and to offload joints by unweighting joint load by at least 50 % in navel deep water and by at least 75-80% in chest to neck deep water.  Viscosity of the water is needed for resistance of strengthening. Water current perturbations provides challenge to standing balance requiring increased core activation.    10/31- previous land  STM bilat C/S paraspinals C2-6 Manual traction Manual traction with chin nod 10x  Exercises -seated pelvic tilt 20x (unable to perform supine due to lack of motor control) - supine Cervical Retraction  - 5-6 x daily - 7 x weekly - 1 sets - 5-10 reps -Rowing blue TB 2x10 - bridge with PPT 2s 3x10   PATIENT EDUCATION:  Education details: aquatic progressions/ modifications  Person educated: Patient Education method: Explanation Education comprehension: verbalized understanding   HOME EXERCISE PROGRAM:  Access Code: [email or web address]: [email or web address] Date: 12/23/2021 Prepared by: ADaleen Bo ASSESSMENT:  CLINICAL IMPRESSION: Overall the patient appears to be making progress.  He reports less pain with functional activities like pushing a grocery cart at the grocery store.  He  does feel like his pain was around sometime this is.  His pain is more on the right side today of his lower back.  Continues to have left upper trap pain.  He continues to feel like he is having some difficulty performing his HEP.  Therapy advised him that he should use his stretches during the day.  He does not need to make specific time specifically for his stretches just do them when he feels like it hurts.  He plans on buying a Thera cane for his left upper trap.  He has had trigger point dry needling in the past without much  benefit but is also not used trigger point dry needling in conjunction with physical therapy.  He was only given 2 exercises to perform at home today.  He was advised to pick even 3 days a week and just make sure he is doing those exercises to try to build a routine.  Overall we were unable to reproduce his low back pain with motion today.  We are only able to reproduce his pain with palpation of trigger point in his right upper gluteal right lower lumbar spine.  He does have cervical rotation limitation to the left as well as a left upper trap spasm.  He also has pain with end range left shoulder flexion.  He would benefit from skilled therapy 1-2 times per week for 6-8 more weeks to continue to build land program and finalize his full program.  OBJECTIVE IMPAIRMENTS cardiopulmonary status limiting activity, difficulty walking, decreased ROM, decreased strength, impaired flexibility, impaired sensation, and pain.   ACTIVITY LIMITATIONS carrying, lifting, bending, sitting, standing, squatting, stairs, and reach over head  PARTICIPATION LIMITATIONS: meal prep, cleaning, laundry, shopping, community activity, occupation, and yard work  PERSONAL FACTORS HTN, aortic aneurism  are also affecting patient's functional outcome.   REHAB POTENTIAL: Good  CLINICAL DECISION MAKING: Unstable/unpredictable  EVALUATION COMPLEXITY: High   GOALS: Goals reviewed with patient? Yes  SHORT TERM GOALS: Target date: 12/23/21  Pt reports compliance with HEP Baseline: pt reports inconsistent compliance  Goal status: Partially met -01/18/22  2.  Left shoulder strength to be tested using dynamometer _0  based appointment Baseline: MMT completed Goal status: achived   3.  Pt will have decreased frequency of P&N/paresthesias into left foot. 5/7days Baseline: 0/7 days for 3 wks Goal status: Achieved -improved pain and foot achieved 12 /4  4.  Pt will report decrease in pain sx after aquatic session lasting at  least a few hours. Baseline:  Goal status: Achieved -improved pain with aquatic therapy 12/4   LONG TERM GOALS: Target date: 01/20/22  Pt to meet FOTO goal of 55 Baseline: 47 Goal status: 49 and 55  2.  Pt to be indep with final aquatic/land based HEP Baseline:  Goal status: Progressing towards final HEP 12/4  3.  Pt will be able to complete standing ADL's without pain Baseline: 4/10 pain with ironing and grocery shopping  Goal status: Ongoing -   4.  Max pain to decrease to 5/10 or< to demonstrate improved management of and controlled pain Baseline: 6/10 max  Goal status:Ongoing - 01/18/22    PLAN: PT FREQUENCY: 1-2x/week  PT DURATION: 8 weeks  PLANNED INTERVENTIONS: Therapeutic exercises, Therapeutic activity, Neuromuscular re-education, Balance training, Gait training, Patient/Family education, Self Care, Joint mobilization, Joint manipulation, Stair training, Aquatic Therapy, Dry Needling, Electrical stimulation, Spinal manipulation, Spinal mobilization, Cryotherapy, Moist heat, Taping, Manual therapy, and Re-evaluation.  PLAN FOR NEXT SESSION: recert/ FOTO  Carolyne Littles PT DPT  01/18/22 9:00 AM [facility] [address] [place], [place], [address] Phone: [phone]   Fax:  [phone]

## 2022-02-03 ENCOUNTER — Ambulatory Visit (HOSPITAL_BASED_OUTPATIENT_CLINIC_OR_DEPARTMENT_OTHER): Payer: BC Managed Care – PPO | Admitting: Physical Therapy

## 2022-02-03 ENCOUNTER — Encounter (HOSPITAL_BASED_OUTPATIENT_CLINIC_OR_DEPARTMENT_OTHER): Payer: Self-pay | Admitting: Physical Therapy

## 2022-02-03 DIAGNOSIS — M6281 Muscle weakness (generalized): Secondary | ICD-10-CM

## 2022-02-03 DIAGNOSIS — M542 Cervicalgia: Secondary | ICD-10-CM

## 2022-02-03 DIAGNOSIS — M5432 Sciatica, left side: Secondary | ICD-10-CM | POA: Diagnosis not present

## 2022-02-03 DIAGNOSIS — M5412 Radiculopathy, cervical region: Secondary | ICD-10-CM

## 2022-02-03 NOTE — Therapy (Signed)
OUTPATIENT PHYSICAL THERAPY THORACOLUMBAR TREATMENT   Patient Name: Gregory Klein MRN: [id number] DOB:[birth year], 56 y.o., male Today's Date: 02/03/2022   PT End of Session - 02/03/22 0740     Visit Number 10    Number of Visits 25    Date for PT Re-Evaluation 03/27/22    Authorization Type BC/BS    PT Start Time [id number]   pt arrived late   PT Stop Time 0815    PT Time Calculation (min) 37 min    Activity Tolerance Patient tolerated treatment well    Behavior During Therapy [facility] for tasks assessed/performed                Past Medical History:  Diagnosis Date   Achilles tendonitis, bilateral 2016   [name], DPM   Acute medial meniscus tear of right knee    Acute myocarditis 2015   viral vs d/t strep infection, resolved w/o sequela   Allergy    Ascending aortic aneurysm ([place]) 11/02/2021   Deformity of metatarsal    Bilateral, mild DJD at the right 1st MTP joint and bilateral decrease in calcaneal inclination angle   DVT (deep venous thrombosis) (HCC)    GERD (gastroesophageal reflux disease)    Hemorrhoids    hx of   Hepatitis B infection    hx of dx 2005 aprox   Hyperglycemia 02/22/2015   Hypertension    Neck pain    local injections x 2, las summer 2011 (Dr.Vortek)   Plantar fasciitis, bilateral 2016   [name], DPM   Sleep apnea    on CPAP    Past Surgical History:  Procedure Laterality Date   INGUINAL HERNIA REPAIR     KNEE ARTHROSCOPY  6-15   Dr Mardelle Matte   KNEE ARTHROSCOPY WITH MEDIAL MENISECTOMY Right 09/26/2018   LEFT HEART CATHETERIZATION WITH CORONARY ANGIOGRAM N/A 12/29/2013   Procedure: LEFT HEART CATHETERIZATION WITH CORONARY ANGIOGRAM;  Surgeon: [name], MD;  Location: [facility] CATH LAB;  Service: Cardiovascular;  Laterality: N/A;   Patient Active Problem List   Diagnosis Date Noted   Ascending aortic aneurysm ([place]) 11/02/2021   Hyperglycemia 02/22/2015   PCP NOTES >>> 12/28/2014   OSA on CPAP 07/21/2014   Myocarditis ([place])     Hypertension    GERD (gastroesophageal reflux disease)    Hepatitis B infection    Neck pain--Chronic 11/10/2013   Annual physical exam 03/29/2011    PCP: Colon Branch, MD  REFERRING PROVIDER: [name], MD  REFERRING DIAG:  I10 (ICD-10-CM) - Primary hypertension  I71.21 (ICD-10-CM) - Aneurysm of ascending aorta without rupture [facility])    Rationale for Evaluation and Treatment Rehabilitation  THERAPY DIAG:  Sciatica, left side  Muscle weakness (generalized)  Cervicalgia  Radiculopathy, cervical region  ONSET DATE: 2 yrs  SUBJECTIVE:  SUBJECTIVE STATEMENT: "I feel like my pain has flip-flopped.  My Rt back / buttock is now bothering me."    "Now when I iron, wash dishes or push a buggy...it is a lot better".     PERTINENT HISTORY:  56 y.o. male with a hx of acute myocarditis, DVT, hypertension, prediabetes, GERD, and sleep apnea on CPAP,  enlarged aorta .  Low back with sciatica pain and cervical neck pain.  Cervical stenosis;  KNEE ARTHROSCOPY WITH MEDIAL MENISECTOMY Right 09/26/2018     PAIN:  Are you having pain? Yes: NPRS scale: current 4/10 Pain location: Left upper trap/neck; Rt hip (points to glute med area) Pain description: Aching Aggravating factors: Sitting for periods of time Relieving factors: lying down/sleeping   PRECAUTIONS: Cervical  WEIGHT BEARING RESTRICTIONS No  FALLS:  Has patient fallen in last 6 months? 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 Mild progression of central disc  protrusion C3-4. Mild spinal stenosis and mild foraminal stenosis bilaterally similar to the prior study 2. Moderate spinal stenosis with moderate foraminal stenosis bilaterally at C4-5 and C5-6 unchanged 3. Mild foraminal narrowing bilaterally C6-7 unchanged. 03/29/21 Lumbar spine MRI: Mild degenerative changes as detailed above  PATIENT SURVEYS:  FOTO 47 with goal of 55  SCREENING FOR RED FLAGS:  Neg COGNITION:  Overall cognitive status: Within functional limits for tasks assessed     UE ROM/STRENGTH ROM: Left shoulder; elbow wfl STR: Left shoulder ~4-/5 (plan to measure using dynamometer @ land based appointment)         LOWER EXTREMITY ROM:      Active  Right eval Left eval  Hip flexion 80 70 in supine  Hip extension 10 5d  Hip abduction      Hip adduction      Hip internal rotation wfl wfl  Hip external rotation Limited 25% Limited 50%  Knee flexion      Knee extension      Ankle dorsiflexion      Ankle plantarflexion      Ankle inversion      Ankle eversion       (Blank rows = WFL)   LOWER EXTREMITY MMT:     MMT Right eval Left eval Right  12/4 Left 12/4  Hip flexion 4 4 5/ 38.3 4+/ 31.4  Hip extension 4- 4-    Hip abduction 4 4 5/48.3 5/49.7  Hip adduction 5 5    Hip internal rotation wfl wfl    Hip external rotation wfl Limited no P!    Knee flexion 5 5    Knee extension 5 5 53.4 62.8  Ankle dorsiflexion        Ankle plantarflexion        Ankle inversion        Ankle eversion         (Blank rows = WFL)   LUMBAR SPECIAL TESTS:  Straight leg raise test: Negative, Slump test: Positive, and Thomas test: Negative  Cervical rotation L 65  Right 70         TODAY'S TREATMENT  12/8: Pt seen for aquatic therapy today.  Treatment took place in water 3.25-4.5 ft in depth at the [place]. Temp of water was 92.  Pt entered/exited  the pool via stairs with hand rail.  -Walking forward, back, sidestepping in 4.69f - staggered  stance with reciprocal arm swing with resistance bells, slow then fast 20 sec - wide stace with bilat horiz shoulder abdct/add slow, then fast 15 sec - SLS with rainbow hand floats and leg press slow x 10, then fast for 20sec - balance on thin squoodle (good challenge) - TrA set with thin squoodle push down, then isometric hold  - seated on 3rd step, fig 4 stretch, then piriformis stretch pulling knee to opp shoulder - gastroc stretch with heel off of step (bilat, then unilateral), then unilateral soleus stretch  - wide stance R/L pivots with side to side bell swing with core engaged - UE with rainbow hand floats on surface with hip abdct/add x 10 each - UE press downs at side with yellow hand floats, 3 sec pause in scap depression  Pt requires the buoyancy and hydrostatic pressure of water for support, and to offload joints by unweighting joint load by at least 50 % in navel deep water and by at least 75-80% in chest to neck deep water.  Viscosity of the water is needed for resistance of strengthening. Water current perturbations provides challenge to standing balance requiring increased core activation.   Previous: land 12/4  Reviewed testing measures including strength and range of motion. Self-care: Reviewed how to use his home exercise program reviewed this difference between stretches and exercises.  Talked with patient about what he thinks will be the best as far as him his ability to be able to do some of these activities.  Self soft tissue mobilization using Thera cane and tennis ball.  Upper trap stretch 2 x 20 seconds for left Gluteal stretch 3 x 20 seconds seated  Standing row green 3 x 10 with cueing for posture Shoulder extension 3 x 10 with cueing for posture   PATIENT EDUCATION:  Education details: aquatic progressions/ modifications  Person educated: Patient Education method: Explanation Education comprehension: verbalized understanding   HOME EXERCISE  PROGRAM:  Access Code: [email or web address]: [email or web address] Date: 12/23/2021 Prepared by: ADaleen Bo ASSESSMENT:  CLINICAL IMPRESSION: Encouraged self massage with tennis ball to Lt upper trap/levator and Rt glute med, as well as piriformis stretch when at desk.  Also encouraged pt to be mindful of his posture while at work and when driving (is he leaning to one side).  He had some mild increase in Rt buttock pain when completing TrA set with resistance through arms; lessened with change in exercise and hip stretches.  He is making gradual progress towards remaining goals.   OBJECTIVE IMPAIRMENTS cardiopulmonary status limiting activity, difficulty walking, decreased ROM, decreased strength, impaired flexibility, impaired sensation, and pain.   ACTIVITY LIMITATIONS carrying, lifting, bending, sitting, standing, squatting, stairs, and reach over head  PARTICIPATION LIMITATIONS: meal prep, cleaning, laundry, shopping, community activity, occupation, and yard work  PERSONAL FACTORS HTN, aortic aneurism  are also affecting patient's functional outcome.   REHAB POTENTIAL: Good  CLINICAL DECISION MAKING: Unstable/unpredictable  EVALUATION COMPLEXITY: High   GOALS: Goals reviewed with patient? Yes  SHORT TERM GOALS: Target date: 12/23/21  Pt reports compliance with HEP Baseline: pt reports inconsistent compliance  Goal status: Partially met -01/18/22  2.  Left shoulder strength to be tested using dynamometer _0  based appointment Baseline: MMT completed Goal status: achived   3.  Pt will have decreased frequency of P&N/paresthesias into left foot. 5/7days Baseline: 0/7 days for 3 wks Goal status: Achieved -improved pain  and foot achieved 12 /4  4.  Pt will report decrease in pain sx after aquatic session lasting at least a few hours. Baseline:  Goal status: Achieved -improved pain with aquatic therapy 12/4   LONG TERM GOALS: Target date: 01/20/22  Pt to meet  FOTO goal of 55 Baseline: 47 Goal status: 49 and 55  2.  Pt to be indep with final aquatic/land based HEP Baseline:  Goal status: Progressing towards final HEP 12/4  3.  Pt will be able to complete standing ADL's without pain Baseline: 4/10 pain with ironing and grocery shopping  Goal status: Ongoing -   4.  Max pain to decrease to 5/10 or< to demonstrate improved management of and controlled pain Baseline: 6/10 max  Goal status:Ongoing - 01/18/22    PLAN: PT FREQUENCY: 1-2x/week  PT DURATION: 8 weeks  PLANNED INTERVENTIONS: Therapeutic exercises, Therapeutic activity, Neuromuscular re-education, Balance training, Gait training, Patient/Family education, Self Care, Joint mobilization, Joint manipulation, Stair training, Aquatic Therapy, Dry Needling, Electrical stimulation, Spinal manipulation, Spinal mobilization, Cryotherapy, Moist heat, Taping, Manual therapy, and Re-evaluation.  PLAN FOR NEXT SESSION:   continue flexibility and core strengthening. Encourage compliance with HEP.   Kerin Perna, PTA 02/03/22 12:10 PM [facility] [address] [place], [place], [address] Phone: [phone]   Fax:  [phone]

## 2022-02-08 ENCOUNTER — Ambulatory Visit (HOSPITAL_BASED_OUTPATIENT_CLINIC_OR_DEPARTMENT_OTHER): Payer: BC Managed Care – PPO | Admitting: Physical Therapy

## 2022-02-09 ENCOUNTER — Encounter (HOSPITAL_BASED_OUTPATIENT_CLINIC_OR_DEPARTMENT_OTHER): Payer: Self-pay | Admitting: Physical Therapy

## 2022-02-09 ENCOUNTER — Ambulatory Visit (HOSPITAL_BASED_OUTPATIENT_CLINIC_OR_DEPARTMENT_OTHER): Payer: BC Managed Care – PPO | Admitting: Physical Therapy

## 2022-02-09 DIAGNOSIS — M6281 Muscle weakness (generalized): Secondary | ICD-10-CM

## 2022-02-09 DIAGNOSIS — M5432 Sciatica, left side: Secondary | ICD-10-CM | POA: Diagnosis not present

## 2022-02-09 NOTE — Therapy (Addendum)
OUTPATIENT PHYSICAL THERAPY THORACOLUMBAR TREATMENT   Patient Name: CELIA GIBBONS MRN: [id number] DOB:[birth year], 56 y.o., male Today's Date: 02/03/2022   PT End of Session - 02/03/22 0740     Visit Number 10    Number of Visits 25    Date for PT Re-Evaluation 03/27/22    Authorization Type BC/BS    PT Start Time [phone]   pt arrived late   PT Stop Time 0815    PT Time Calculation (min) 37 min    Activity Tolerance Patient tolerated treatment well    Behavior During Therapy [facility] for tasks assessed/performed                Past Medical History:  Diagnosis Date   Achilles tendonitis, bilateral 2016   [name], DPM   Acute medial meniscus tear of right knee    Acute myocarditis 2015   viral vs d/t strep infection, resolved w/o sequela   Allergy    Ascending aortic aneurysm ([place]) 11/02/2021   Deformity of metatarsal    Bilateral, mild DJD at the right 1st MTP joint and bilateral decrease in calcaneal inclination angle   DVT (deep venous thrombosis) (HCC)    GERD (gastroesophageal reflux disease)    Hemorrhoids    hx of   Hepatitis B infection    hx of dx 2005 aprox   Hyperglycemia 02/22/2015   Hypertension    Neck pain    local injections x 2, las summer 2011 (Dr.Vortek)   Plantar fasciitis, bilateral 2016   [name], DPM   Sleep apnea    on CPAP    Past Surgical History:  Procedure Laterality Date   INGUINAL HERNIA REPAIR     KNEE ARTHROSCOPY  6-15   Dr Mardelle Matte   KNEE ARTHROSCOPY WITH MEDIAL MENISECTOMY Right 09/26/2018   LEFT HEART CATHETERIZATION WITH CORONARY ANGIOGRAM N/A 12/29/2013   Procedure: LEFT HEART CATHETERIZATION WITH CORONARY ANGIOGRAM;  Surgeon: [name], MD;  Location: [facility] CATH LAB;  Service: Cardiovascular;  Laterality: N/A;   Patient Active Problem List   Diagnosis Date Noted   Ascending aortic aneurysm ([place]) 11/02/2021   Hyperglycemia 02/22/2015   PCP NOTES >>> 12/28/2014   OSA on CPAP 07/21/2014   Myocarditis ([place])     Hypertension    GERD (gastroesophageal reflux disease)    Hepatitis B infection    Neck pain--Chronic 11/10/2013   Annual physical exam 03/29/2011    PCP: Colon Branch, MD  REFERRING PROVIDER: [name], MD  REFERRING DIAG:  I10 (ICD-10-CM) - Primary hypertension  I71.21 (ICD-10-CM) - Aneurysm of ascending aorta without rupture [facility])    Rationale for Evaluation and Treatment Rehabilitation  THERAPY DIAG:  Sciatica, left side  Muscle weakness (generalized)  Cervicalgia  Radiculopathy, cervical region  ONSET DATE: 2 yrs  SUBJECTIVE:  SUBJECTIVE STATEMENT: "I'm doing better."     PERTINENT HISTORY:  56 y.o. male with a hx of acute myocarditis, DVT, hypertension, prediabetes, GERD, and sleep apnea on CPAP,  enlarged aorta .  Low back with sciatica pain and cervical neck pain.  Cervical stenosis;  KNEE ARTHROSCOPY WITH MEDIAL MENISECTOMY Right 09/26/2018     PAIN:  Are you having pain? Yes: NPRS scale: current 3/10 Pain location: Left upper trap/neck; Rt hip (points to glute med area) Pain description: Aching Aggravating factors: Sitting for periods of time Relieving factors: lying down/sleeping   PRECAUTIONS: Cervical  WEIGHT BEARING RESTRICTIONS No  FALLS:  Has patient fallen in last 6 months? 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 Mild progression of central disc protrusion C3-4. Mild spinal stenosis and mild foraminal stenosis bilaterally similar to the prior study 2. Moderate spinal stenosis with  moderate foraminal stenosis bilaterally at C4-5 and C5-6 unchanged 3. Mild foraminal narrowing bilaterally C6-7 unchanged. 03/29/21 Lumbar spine MRI: Mild degenerative changes as detailed above  PATIENT SURVEYS:  FOTO 47 with goal of 55  SCREENING FOR RED FLAGS:  Neg COGNITION:  Overall cognitive status: Within functional limits for tasks assessed     UE ROM/STRENGTH ROM: Left shoulder; elbow wfl STR: Left shoulder ~4-/5 (plan to measure using dynamometer @ land based appointment)         LOWER EXTREMITY ROM:      Active  Right eval Left eval  Hip flexion 80 70 in supine  Hip extension 10 5d  Hip abduction      Hip adduction      Hip internal rotation wfl wfl  Hip external rotation Limited 25% Limited 50%  Knee flexion      Knee extension      Ankle dorsiflexion      Ankle plantarflexion      Ankle inversion      Ankle eversion       (Blank rows = WFL)   LOWER EXTREMITY MMT:     MMT Right eval Left eval Right  12/4 Left 12/4  Hip flexion 4 4 5/ 38.3 4+/ 31.4  Hip extension 4- 4-    Hip abduction 4 4 5/48.3 5/49.7  Hip adduction 5 5    Hip internal rotation wfl wfl    Hip external rotation wfl Limited no P!    Knee flexion 5 5    Knee extension 5 5 53.4 62.8  Ankle dorsiflexion        Ankle plantarflexion        Ankle inversion        Ankle eversion         (Blank rows = WFL)   LUMBAR SPECIAL TESTS:  Straight leg raise test: Negative, Slump test: Positive, and Thomas test: Negative  Cervical rotation L 65  Right 70         TODAY'S TREATMENT  12/14: Pt seen for aquatic therapy today.  Treatment took place in water 3.25-4.5 ft in depth at the [place]. Temp of water was 92.  Pt entered/exited the pool via stairs with hand rail.  -Walking forward, back, sidestepping in 4.43f - staggered stance then wide stance with reciprocal arm swing with resistance bells, 2 sets slow then  fast 20 sec - wide stace with bilat horiz shoulder  abdct/add 2 sets slow, then fast 20 sec -forward jogging hand bells for resistance x 6 widths -SLS with sm sq noodle leg  press R/L 15 sec intervals fast/slow x 2-3 sets. No ue support - balance on thin squoodle wide stance then feet together - -tandem wide stance (good challenge) - UE press downs at side with blue hand buoys, 3 sec pause in scap depression - TrA set with blue hand buoys push down, then isometric hold  - seated on 3rd step, fig 4 stretch, then piriformis stretch pulling knee to opp shoulder - gastroc stretch with heel off of step (bilat, then unilateral), then unilateral soleus stretch  - UE with  hand floats on surface with hip abdct/add x 10 each   Pt requires the buoyancy and hydrostatic pressure of water for support, and to offload joints by unweighting joint load by at least 50 % in navel deep water and by at least 75-80% in chest to neck deep water.  Viscosity of the water is needed for resistance of strengthening. Water current perturbations provides challenge to standing balance requiring increased core activation.   Previous: land 12/4  Reviewed testing measures including strength and range of motion. Self-care: Reviewed how to use his home exercise program reviewed this difference between stretches and exercises.  Talked with patient about what he thinks will be the best as far as him his ability to be able to do some of these activities.  Self soft tissue mobilization using Thera cane and tennis ball.  Upper trap stretch 2 x 20 seconds for left Gluteal stretch 3 x 20 seconds seated  Standing row green 3 x 10 with cueing for posture Shoulder extension 3 x 10 with cueing for posture   PATIENT EDUCATION:  Education details: aquatic progressions/ modifications  Person educated: Patient Education method: Explanation Education comprehension: verbalized understanding   HOME EXERCISE PROGRAM:  Access Code: [email or web address] URL:  [email or web address] Date: 12/23/2021 Prepared by: Daleen Bo  ASSESSMENT:  CLINICAL IMPRESSION: Improved motor plan and execution of standing balance on noodle with wide stance. Progressed to tandem (wide stance). Pt reports some compliance with HEP. Pain has returned to right hip area. Reduces with water therapy intervention. He is making gradual progress towards remaining goals.    OBJECTIVE IMPAIRMENTS cardiopulmonary status limiting activity, difficulty walking, decreased ROM, decreased strength, impaired flexibility, impaired sensation, and pain.   ACTIVITY LIMITATIONS carrying, lifting, bending, sitting, standing, squatting, stairs, and reach over head  PARTICIPATION LIMITATIONS: meal prep, cleaning, laundry, shopping, community activity, occupation, and yard work  PERSONAL FACTORS HTN, aortic aneurism  are also affecting patient's functional outcome.   REHAB POTENTIAL: Good  CLINICAL DECISION MAKING: Unstable/unpredictable  EVALUATION COMPLEXITY: High   GOALS: Goals reviewed with patient? Yes  SHORT TERM GOALS: Target date: 12/23/21  Pt reports compliance with HEP Baseline: pt reports inconsistent compliance  Goal status: Partially met -01/18/22  2.  Left shoulder strength to be tested using dynamometer _0  based appointment Baseline: MMT completed Goal status: achived   3.  Pt will have decreased frequency of P&N/paresthesias into left foot. 5/7days Baseline: 0/7 days for 3 wks Goal status: Achieved -improved pain and foot achieved 12 /4  4.  Pt will report decrease in pain sx after aquatic session lasting at least a few hours. Baseline:  Goal status: Achieved -improved pain with aquatic therapy 12/4   LONG TERM GOALS: Target date: 01/20/22  Pt to meet FOTO goal  of 55 Baseline: 47 Goal status: 49 and 55  2.  Pt to be indep with final aquatic/land based HEP Baseline:  Goal status: Progressing towards final HEP 12/4  3.  Pt will be able  to complete standing ADL's without pain Baseline: 4/10 pain with ironing and grocery shopping  Goal status: Ongoing -   4.  Max pain to decrease to 5/10 or< to demonstrate improved management of and controlled pain Baseline: 6/10 max  Goal status:Ongoing - 01/18/22    PLAN: PT FREQUENCY: 1-2x/week  PT DURATION: 8 weeks  PLANNED INTERVENTIONS: Therapeutic exercises, Therapeutic activity, Neuromuscular re-education, Balance training, Gait training, Patient/Family education, Self Care, Joint mobilization, Joint manipulation, Stair training, Aquatic Therapy, Dry Needling, Electrical stimulation, Spinal manipulation, Spinal mobilization, Cryotherapy, Moist heat, Taping, Manual therapy, and Re-evaluation.  PLAN FOR NEXT SESSION:   continue flexibility and core strengthening. Encourage compliance with HEP.   Stanton Kidney [place]) [place] MPT 02/09/22 [facility] [address] [place], [place], [address] Phone: [phone]   Fax:  [phone]

## 2022-02-13 ENCOUNTER — Ambulatory Visit (HOSPITAL_BASED_OUTPATIENT_CLINIC_OR_DEPARTMENT_OTHER): Payer: BC Managed Care – PPO | Admitting: Physical Therapy

## 2022-02-14 ENCOUNTER — Encounter (HOSPITAL_BASED_OUTPATIENT_CLINIC_OR_DEPARTMENT_OTHER): Payer: Self-pay | Admitting: Physical Therapy

## 2022-02-14 ENCOUNTER — Ambulatory Visit (HOSPITAL_BASED_OUTPATIENT_CLINIC_OR_DEPARTMENT_OTHER): Payer: BC Managed Care – PPO | Admitting: Physical Therapy

## 2022-02-14 DIAGNOSIS — M5432 Sciatica, left side: Secondary | ICD-10-CM | POA: Diagnosis not present

## 2022-02-14 DIAGNOSIS — M542 Cervicalgia: Secondary | ICD-10-CM

## 2022-02-14 DIAGNOSIS — M5412 Radiculopathy, cervical region: Secondary | ICD-10-CM

## 2022-02-14 DIAGNOSIS — M6281 Muscle weakness (generalized): Secondary | ICD-10-CM

## 2022-02-14 NOTE — Therapy (Signed)
OUTPATIENT PHYSICAL THERAPY THORACOLUMBAR TREATMENT   Patient Name: Gregory Klein MRN: [id number] DOB:12-Sep-1965, 57 y.o., male Today's Date: 02/14/2022   PT End of Session - 02/14/22 0942     Visit Number 12    Number of Visits 25    Date for PT Re-Evaluation 03/27/22    Authorization Type BC/BS    PT Start Time [phone]   patient 7 min late   PT Stop Time 1015    PT Time Calculation (min) 38 min    Activity Tolerance Patient tolerated treatment well    Behavior During Therapy [facility] for tasks assessed/performed                Past Medical History:  Diagnosis Date   Achilles tendonitis, bilateral 2016   [name], DPM   Acute medial meniscus tear of right knee    Acute myocarditis 2015   viral vs d/t strep infection, resolved w/o sequela   Allergy    Ascending aortic aneurysm ([facility]) 11/02/2021   Deformity of metatarsal    Bilateral, mild DJD at the right 1st MTP joint and bilateral decrease in calcaneal inclination angle   DVT (deep venous thrombosis) (HCC)    GERD (gastroesophageal reflux disease)    Hemorrhoids    hx of   Hepatitis B infection    hx of dx 2005 aprox   Hyperglycemia 02/22/2015   Hypertension    Neck pain    local injections x 2, las summer 2011 (Dr.Vortek)   Plantar fasciitis, bilateral 2016   [name], DPM   Sleep apnea    on CPAP    Past Surgical History:  Procedure Laterality Date   INGUINAL HERNIA REPAIR     KNEE ARTHROSCOPY  6-15   Dr Dion Saucier   KNEE ARTHROSCOPY WITH MEDIAL MENISECTOMY Right 09/26/2018   LEFT HEART CATHETERIZATION WITH CORONARY ANGIOGRAM N/A 12/29/2013   Procedure: LEFT HEART CATHETERIZATION WITH CORONARY ANGIOGRAM;  Surgeon: Wendall Stade, MD;  Location: [facility] CATH LAB;  Service: Cardiovascular;  Laterality: N/A;   Patient Active Problem List   Diagnosis Date Noted   Ascending aortic aneurysm ([facility]CC) 11/02/2021   Hyperglycemia 02/22/2015   PCP NOTES >>> 12/28/2014   OSA on CPAP 07/21/2014   Myocarditis ([facility]CC)     Hypertension    GERD (gastroesophageal reflux disease)    Hepatitis B infection    Neck pain--Chronic 11/10/2013   Annual physical exam 03/29/2011    PCP: [name], MD  REFERRING PROVIDER: Chilton Si, MD  REFERRING DIAG:  I10 (ICD-10-CM) - Primary hypertension  I71.21 (ICD-10-CM) - Aneurysm of ascending aorta without rupture [facility])    Rationale for Evaluation and Treatment Rehabilitation  THERAPY DIAG:  No diagnosis found.  ONSET DATE: 2 yrs  SUBJECTIVE:  SUBJECTIVE STATEMENT: [place] he has not had much pain over the past few days.  He will be on break for the next few weeks.  He hopes to be able to do his HEP more consistently.  PERTINENT HISTORY:  56 y.o. male with a hx of acute myocarditis, DVT, hypertension, prediabetes, GERD, and sleep apnea on CPAP,  enlarged aorta .  Low back with sciatica pain and cervical neck pain.  Cervical stenosis;  KNEE ARTHROSCOPY WITH MEDIAL MENISECTOMY Right 09/26/2018     PAIN:  Are you having pain? Yes: NPRS scale: current 3/10 Pain location: Left upper trap/neck; Rt hip (points to glute med area) Pain description: Aching Aggravating factors: Sitting for periods of time Relieving factors: lying down/sleeping   PRECAUTIONS: Cervical  WEIGHT BEARING RESTRICTIONS No  FALLS:  Has patient fallen in last 6 months? 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 Mild progression of central disc protrusion C3-4. Mild spinal stenosis and mild foraminal stenosis bilaterally similar to  the prior study 2. Moderate spinal stenosis with moderate foraminal stenosis bilaterally at C4-5 and C5-6 unchanged 3. Mild foraminal narrowing bilaterally C6-7 unchanged. 03/29/21 Lumbar spine MRI: Mild degenerative changes as detailed above  PATIENT SURVEYS:  FOTO 47 with goal of 55  SCREENING FOR RED FLAGS:  Neg COGNITION:  Overall cognitive status: Within functional limits for tasks assessed     UE ROM/STRENGTH ROM: Left shoulder; elbow wfl STR: Left shoulder ~4-/5 (plan to measure using dynamometer @ land based appointment)         LOWER EXTREMITY ROM:      Active  Right eval Left eval  Hip flexion 80 70 in supine  Hip extension 10 5d  Hip abduction      Hip adduction      Hip internal rotation wfl wfl  Hip external rotation Limited 25% Limited 50%  Knee flexion      Knee extension      Ankle dorsiflexion      Ankle plantarflexion      Ankle inversion      Ankle eversion       (Blank rows = WFL)   LOWER EXTREMITY MMT:     MMT Right eval Left eval Right  12/4 Left 12/4  Hip flexion 4 4 5/ 38.3 4+/ 31.4  Hip extension 4- 4-    Hip abduction 4 4 5/48.3 5/49.7  Hip adduction 5 5    Hip internal rotation wfl wfl    Hip external rotation wfl Limited no P!    Knee flexion 5 5    Knee extension 5 5 53.4 62.8  Ankle dorsiflexion        Ankle plantarflexion        Ankle inversion        Ankle eversion         (Blank rows = WFL)   LUMBAR SPECIAL TESTS:  Straight leg raise test: Negative, Slump test: Positive, and Thomas test: Negative  Cervical rotation L 65  Right 70         TODAY'S TREATMENT  12/19 Supine march 2x15  Clamshell 2x15 green  Supine bridge 2x15   Leg press 40 pounds 3 x 15 lifestyles Row machine 3 x 15 20 pounds Cable extension 3  x 15 10 pounds Cable row 3 x 15 10 pounds  All exercises done with cueing for proper posture and core breathing. Patient educated on proper set up of all machines          12/14: Pt seen  for aquatic therapy today.  Treatment took place in water 3.25-4.5 ft in depth at the [facility] pool. Temp of water was 92.  Pt entered/exited the pool via stairs with hand rail.  -Walking forward, back, sidestepping in 4.23ft - staggered stance then wide stance with reciprocal arm swing with resistance bells, 2 sets slow then fast 20 sec - wide stace with bilat horiz shoulder abdct/add 2 sets slow, then fast 20 sec -forward jogging hand bells for resistance x 6 widths -SLS with sm sq noodle leg  press R/L 15 sec intervals fast/slow x 2-3 sets. No ue support - balance on thin squoodle wide stance then feet together - -tandem wide stance (good challenge) - UE press downs at side with blue hand buoys, 3 sec pause in scap depression - TrA set with blue hand buoys push down, then isometric hold  - seated on 3rd step, fig 4 stretch, then piriformis stretch pulling knee to opp shoulder - gastroc stretch with heel off of step (bilat, then unilateral), then unilateral soleus stretch  - UE with  hand floats on surface with hip abdct/add x 10 each   Pt requires the buoyancy and hydrostatic pressure of water for support, and to offload joints by unweighting joint load by at least 50 % in navel deep water and by at least 75-80% in chest to neck deep water.  Viscosity of the water is needed for resistance of strengthening. Water current perturbations provides challenge to standing balance requiring increased core activation.   Previous: land 12/4  Reviewed testing measures including strength and range of motion. Self-care: Reviewed how to use his home exercise program reviewed this difference between stretches and exercises.  Talked with patient about what he thinks will be the best as far as him his ability to be able to do some of these activities.  Self soft tissue mobilization using Thera cane and tennis ball.  Upper trap stretch 2 x 20 seconds for left Gluteal stretch 3 x 20 seconds  seated  Standing row green 3 x 10 with cueing for posture Shoulder extension 3 x 10 with cueing for posture   PATIENT EDUCATION:  Education details: aquatic progressions/ modifications  Person educated: Patient Education method: Explanation Education comprehension: verbalized understanding   HOME EXERCISE PROGRAM:  Access Code: [email or web address] URL: [email or web address] Date: 12/23/2021 Prepared by: Zebedee Iba  ASSESSMENT:  CLINICAL IMPRESSION: The patient tolerated treatment well.  He has had no pain.  We reviewed a base supine program to warm up.  We then progressed into gym activity.  He hopes to join the gym in January.  We reviewed both upper extremity and lower extremity exercises and how to use those exercises to improve core stability and posture.  He had no increased pain.  He was advised if he does have pain to use his tennis ball and stretches to reduce pain.  We will continue to expand program as tolerated.  OBJECTIVE IMPAIRMENTS cardiopulmonary status limiting activity, difficulty walking, decreased ROM, decreased strength, impaired flexibility, impaired sensation, and pain.   ACTIVITY LIMITATIONS carrying, lifting, bending, sitting, standing, squatting, stairs, and reach over head  PARTICIPATION LIMITATIONS: meal prep, cleaning, laundry, shopping, community activity, occupation, and yard work  PERSONAL FACTORS HTN, aortic  aneurism  are also affecting patient's functional outcome.   REHAB POTENTIAL: Good  CLINICAL DECISION MAKING: Unstable/unpredictable  EVALUATION COMPLEXITY: High   GOALS: Goals reviewed with patient? Yes  SHORT TERM GOALS: Target date: 12/23/21  Pt reports compliance with HEP Baseline: pt reports inconsistent compliance  Goal status: Partially met -01/18/22  2.  Left shoulder strength to be tested using dynamometer @land  based appointment Baseline: MMT completed Goal status: achived   3.  Pt will have decreased frequency of  P&N/paresthesias into left foot. 5/7days Baseline: 0/7 days for 3 wks Goal status: Achieved -improved pain and foot achieved 12 /4  4.  Pt will report decrease in pain sx after aquatic session lasting at least a few hours. Baseline:  Goal status: Achieved -improved pain with aquatic therapy 12/4   LONG TERM GOALS: Target date: 01/20/22  Pt to meet FOTO goal of 55 Baseline: 47 Goal status: 49 and 55  2.  Pt to be indep with final aquatic/land based HEP Baseline:  Goal status: Progressing towards final HEP 12/4  3.  Pt will be able to complete standing ADL's without pain Baseline: 4/10 pain with ironing and grocery shopping  Goal status: Ongoing -   4.  Max pain to decrease to 5/10 or< to demonstrate improved management of and controlled pain Baseline: 6/10 max  Goal status:Ongoing - 01/18/22    PLAN: PT FREQUENCY: 1-2x/week  PT DURATION: 8 weeks  PLANNED INTERVENTIONS: Therapeutic exercises, Therapeutic activity, Neuromuscular re-education, Balance training, Gait training, Patient/Family education, Self Care, Joint mobilization, Joint manipulation, Stair training, Aquatic Therapy, Dry Needling, Electrical stimulation, Spinal manipulation, Spinal mobilization, Cryotherapy, Moist heat, Taping, Manual therapy, and Re-evaluation.  PLAN FOR NEXT SESSION:   continue flexibility and core strengthening. Encourage compliance with HEP.    Lorayne Bender PT DPT   [facility] [address] [place], [place], [address] Phone: [phone]   Fax:  [phone]

## 2022-02-28 ENCOUNTER — Ambulatory Visit (HOSPITAL_BASED_OUTPATIENT_CLINIC_OR_DEPARTMENT_OTHER): Payer: BC Managed Care – PPO | Attending: Cardiovascular Disease | Admitting: Physical Therapy

## 2022-02-28 ENCOUNTER — Encounter (HOSPITAL_BASED_OUTPATIENT_CLINIC_OR_DEPARTMENT_OTHER): Payer: Self-pay | Admitting: Physical Therapy

## 2022-02-28 DIAGNOSIS — M542 Cervicalgia: Secondary | ICD-10-CM | POA: Insufficient documentation

## 2022-02-28 DIAGNOSIS — M5412 Radiculopathy, cervical region: Secondary | ICD-10-CM | POA: Insufficient documentation

## 2022-02-28 DIAGNOSIS — M6281 Muscle weakness (generalized): Secondary | ICD-10-CM | POA: Insufficient documentation

## 2022-02-28 DIAGNOSIS — M5432 Sciatica, left side: Secondary | ICD-10-CM | POA: Insufficient documentation

## 2022-02-28 NOTE — Therapy (Signed)
OUTPATIENT PHYSICAL THERAPY THORACOLUMBAR TREATMENT   Patient Name: Gregory Klein MRN: [id number] DOB:[birth year], 57 y.o., male Today's Date: 02/28/2022   PT End of Session - 02/28/22 0818     Visit Number 13    Number of Visits 25    Date for PT Re-Evaluation 03/27/22    Authorization Type BC/BS    PT Start Time 0803    PT Stop Time 0842    PT Time Calculation (min) 39 min    Activity Tolerance Patient tolerated treatment well    Behavior During Therapy [facility] for tasks assessed/performed                 Past Medical History:  Diagnosis Date   Achilles tendonitis, bilateral 2016   [name], DPM   Acute medial meniscus tear of right knee    Acute myocarditis 2015   viral vs d/t strep infection, resolved w/o sequela   Allergy    Ascending aortic aneurysm ([place]) 11/02/2021   Deformity of metatarsal    Bilateral, mild DJD at the right 1st MTP joint and bilateral decrease in calcaneal inclination angle   DVT (deep venous thrombosis) ([place])    GERD (gastroesophageal reflux disease)    Hemorrhoids    hx of   Hepatitis B infection    hx of dx 2005 aprox   Hyperglycemia 02/22/2015   Hypertension    Neck pain    local injections x 2, las summer 2011 (Dr.Vortek)   Plantar fasciitis, bilateral 2016   [name], DPM   Sleep apnea    on CPAP    Past Surgical History:  Procedure Laterality Date   INGUINAL HERNIA REPAIR     KNEE ARTHROSCOPY  6-15   Dr Mardelle Matte   KNEE ARTHROSCOPY WITH MEDIAL MENISECTOMY Right 09/26/2018   LEFT HEART CATHETERIZATION WITH CORONARY ANGIOGRAM N/A 12/29/2013   Procedure: LEFT HEART CATHETERIZATION WITH CORONARY ANGIOGRAM;  Surgeon: [name], MD;  Location: [facility] CATH LAB;  Service: Cardiovascular;  Laterality: N/A;   Patient Active Problem List   Diagnosis Date Noted   Ascending aortic aneurysm ([place]) 11/02/2021   Hyperglycemia 02/22/2015   PCP NOTES >>> 12/28/2014   OSA on CPAP 07/21/2014   Myocarditis ([place])    Hypertension     GERD (gastroesophageal reflux disease)    Hepatitis B infection    Neck pain--Chronic 11/10/2013   Annual physical exam 03/29/2011    PCP: Colon Branch, MD  REFERRING PROVIDER: [name], MD  REFERRING DIAG:  I10 (ICD-10-CM) - Primary hypertension  I71.21 (ICD-10-CM) - Aneurysm of ascending aorta without rupture ([place])    Rationale for Evaluation and Treatment Rehabilitation  THERAPY DIAG:  Muscle weakness (generalized)  Cervicalgia  Sciatica, left side  ONSET DATE: 2 yrs  SUBJECTIVE:  SUBJECTIVE STATEMENT: Pt states that after last session he has some abdominal pain that wrapped around his back. Went up to 10/10. Had imaging and urinalysis and that was negative. He went to Urgent Care. He is hoping to get back to the gym in the New Year. Pt states that L side of the neck feels feels tight but no NT.  PERTINENT HISTORY:  57 y.o. male with a hx of acute myocarditis, DVT, hypertension, prediabetes, GERD, and sleep apnea on CPAP,  enlarged aorta .  Low back with sciatica pain and cervical neck pain.  Cervical stenosis;  KNEE ARTHROSCOPY WITH MEDIAL MENISECTOMY Right 09/26/2018     PAIN:  Are you having pain? No: NPRS scale: current 0/10 Pain location: Left upper trap/neck; Rt hip (points to glute med area) Pain description: Aching Aggravating factors: Sitting for periods of time Relieving factors: lying down/sleeping   PRECAUTIONS: Cervical  WEIGHT BEARING RESTRICTIONS No  FALLS:  Has patient fallen in last 6 months? 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 Mild progression of central disc protrusion C3-4. Mild spinal stenosis and mild foraminal stenosis bilaterally similar to the prior study 2. Moderate spinal stenosis with moderate foraminal stenosis bilaterally at C4-5 and C5-6 unchanged 3. Mild foraminal narrowing bilaterally C6-7 unchanged. 03/29/21 Lumbar spine MRI: Mild degenerative changes as detailed above  PATIENT SURVEYS:  FOTO 47 with goal of 55  SCREENING FOR RED FLAGS:  Neg COGNITION:  Overall cognitive status: Within functional limits for tasks assessed     UE ROM/STRENGTH ROM: Left shoulder; elbow wfl STR: Left shoulder ~4-/5 (plan to measure using dynamometer @ land based appointment)         LOWER EXTREMITY ROM:      Active  Right eval Left eval  Hip flexion 80 70 in supine  Hip extension 10 5d  Hip abduction      Hip adduction      Hip internal rotation wfl wfl  Hip external rotation Limited 25% Limited 50%  Knee flexion      Knee extension      Ankle dorsiflexion      Ankle plantarflexion      Ankle inversion      Ankle eversion       (Blank rows = WFL)   LOWER EXTREMITY MMT:     MMT Right eval Left eval Right  12/4 Left 12/4  Hip flexion 4 4 5/ 38.3 4+/ 31.4  Hip extension 4- 4-    Hip abduction 4 4 5/48.3 5/49.7  Hip adduction 5 5    Hip internal rotation wfl wfl    Hip external rotation wfl Limited no P!    Knee flexion 5 5    Knee extension 5 5 53.4 62.8  Ankle dorsiflexion        Ankle plantarflexion        Ankle inversion        Ankle eversion         (Blank rows = WFL)   LUMBAR SPECIAL TESTS:  Straight leg raise test: Negative, Slump test: Positive, and Thomas test: Negative  Cervical rotation L 65  Right 70    TODAY'S TREATMENT   02/28/2021 Cat/cow 2x10 2s hold Child's pose 5s 10x  Paloff  press GTB 2x10  Clamshell 2x15 green  Supine bridge 2x15   Leg press 20 pounds 10x lifestyles Row machine 10x 20  pounds Cable extension 10x 10 pounds Cable row 10x 15 pounds  Gym recovery, exercise set up, strengthening technique Patient educated on proper set up of all machines   12/19 Supine march 2x15  Clamshell 2x15 green  Supine bridge 2x15   Leg press 40 pounds 3 x 15 lifestyles Row machine 3 x 15 20 pounds Cable extension 3 x 15 10 pounds Cable row 3 x 15 10 pounds  All exercises done with cueing for proper posture and core breathing. Patient educated on proper set up of all machines   12/14: Pt seen for aquatic therapy today.  Treatment took place in water 3.25-4.5 ft in depth at the [facility]. Temp of water was 92.  Pt entered/exited the pool via stairs with hand rail.  -Walking forward, back, sidestepping in 4.84f - staggered stance then wide stance with reciprocal arm swing with resistance bells, 2 sets slow then fast 20 sec - wide stace with bilat horiz shoulder abdct/add 2 sets slow, then fast 20 sec -forward jogging hand bells for resistance x 6 widths -SLS with sm sq noodle leg  press R/L 15 sec intervals fast/slow x 2-3 sets. No ue support - balance on thin squoodle wide stance then feet together - -tandem wide stance (good challenge) - UE press downs at side with blue hand buoys, 3 sec pause in scap depression - TrA set with blue hand buoys push down, then isometric hold  - seated on 3rd step, fig 4 stretch, then piriformis stretch pulling knee to opp shoulder - gastroc stretch with heel off of step (bilat, then unilateral), then unilateral soleus stretch  - UE with  hand floats on surface with hip abdct/add x 10 each   Pt requires the buoyancy and hydrostatic pressure of water for support, and to offload joints by unweighting joint load by at least 50 % in navel deep water and by at least 75-80% in chest to neck deep water.  Viscosity of the water is needed for resistance of strengthening. Water current perturbations provides challenge to standing  balance requiring increased core activation.   Previous: land 12/4  Reviewed testing measures including strength and range of motion. Self-care: Reviewed how to use his home exercise program reviewed this difference between stretches and exercises.  Talked with patient about what he thinks will be the best as far as him his ability to be able to do some of these activities.  Self soft tissue mobilization using Thera cane and tennis ball.  Upper trap stretch 2 x 20 seconds for left Gluteal stretch 3 x 20 seconds seated  Standing row green 3 x 10 with cueing for posture Shoulder extension 3 x 10 with cueing for posture   PATIENT EDUCATION:  Education details: aquatic progressions/ modifications  Person educated: Patient Education method: Explanation Education comprehension: verbalized understanding   HOME EXERCISE PROGRAM:  Access Code: [email or web address]: [email or web address] Date: 12/23/2021 Prepared by: ADaleen Bo ASSESSMENT:  CLINICAL IMPRESSION: Given complaint of onset of tightness and soreness after gym based exercise, pt shown self mobility and stretching exercise to reduce DOMS and post-workout soreness and tightness. Pt was able to stretch into saggital and transverse planes without pain but with lack of motor control, especially with cat cow positions. Pt required VC and TC for contraction of spinal flexors and extensor. Marked stiffness in both movements. Pt  then able to  follow up with review of previous gym exercse in order to adjust for technique and form. Pt given thorough edu about gym recovery and graded exposure to exercise and gym based activity. Plan to progress with gym based strength at next gym session. Pt does lack motor control of lumbar region, consider addition of more motor control/coordination at next.   OBJECTIVE IMPAIRMENTS cardiopulmonary status limiting activity, difficulty walking, decreased ROM, decreased strength, impaired flexibility,  impaired sensation, and pain.   ACTIVITY LIMITATIONS carrying, lifting, bending, sitting, standing, squatting, stairs, and reach over head  PARTICIPATION LIMITATIONS: meal prep, cleaning, laundry, shopping, community activity, occupation, and yard work  PERSONAL FACTORS HTN, aortic aneurism  are also affecting patient's functional outcome.   REHAB POTENTIAL: Good  CLINICAL DECISION MAKING: Unstable/unpredictable  EVALUATION COMPLEXITY: High   GOALS: Goals reviewed with patient? Yes  SHORT TERM GOALS: Target date: 12/23/21  Pt reports compliance with HEP Baseline: pt reports inconsistent compliance  Goal status: Partially met -01/18/22  2.  Left shoulder strength to be tested using dynamometer _0  based appointment Baseline: MMT completed Goal status: achived   3.  Pt will have decreased frequency of P&N/paresthesias into left foot. 5/7days Baseline: 0/7 days for 3 wks Goal status: Achieved -improved pain and foot achieved 12 /4  4.  Pt will report decrease in pain sx after aquatic session lasting at least a few hours. Baseline:  Goal status: Achieved -improved pain with aquatic therapy 12/4   LONG TERM GOALS: Target date: 01/20/22  Pt to meet FOTO goal of 55 Baseline: 47 Goal status: 49 and 55  2.  Pt to be indep with final aquatic/land based HEP Baseline:  Goal status: Progressing towards final HEP 12/4  3.  Pt will be able to complete standing ADL's without pain Baseline: 4/10 pain with ironing and grocery shopping  Goal status: Ongoing -   4.  Max pain to decrease to 5/10 or< to demonstrate improved management of and controlled pain Baseline: 6/10 max  Goal status:Ongoing - 01/18/22    PLAN: PT FREQUENCY: 1-2x/week  PT DURATION: 8 weeks  PLANNED INTERVENTIONS: Therapeutic exercises, Therapeutic activity, Neuromuscular re-education, Balance training, Gait training, Patient/Family education, Self Care, Joint mobilization, Joint manipulation, Stair  training, Aquatic Therapy, Dry Needling, Electrical stimulation, Spinal manipulation, Spinal mobilization, Cryotherapy, Moist heat, Taping, Manual therapy, and Re-evaluation.  PLAN FOR NEXT SESSION:   continue flexibility and core strengthening. Encourage compliance with HEP.    Daleen Bo PT, DPT 02/28/22 8:49 AM   [facility] [address] [place], [place], [address] Phone: [phone]   Fax:  [phone]

## 2022-03-03 ENCOUNTER — Ambulatory Visit (HOSPITAL_BASED_OUTPATIENT_CLINIC_OR_DEPARTMENT_OTHER): Payer: BC Managed Care – PPO | Admitting: Physical Therapy

## 2022-03-03 DIAGNOSIS — M6281 Muscle weakness (generalized): Secondary | ICD-10-CM

## 2022-03-03 DIAGNOSIS — M5432 Sciatica, left side: Secondary | ICD-10-CM

## 2022-03-03 DIAGNOSIS — M5412 Radiculopathy, cervical region: Secondary | ICD-10-CM

## 2022-03-03 DIAGNOSIS — M542 Cervicalgia: Secondary | ICD-10-CM

## 2022-03-03 NOTE — Therapy (Signed)
OUTPATIENT PHYSICAL THERAPY THORACOLUMBAR TREATMENT   Patient Name: Gregory Klein MRN: [id number] DOB:[birth year], 57 y.o., male Today's Date: 03/03/2022   PT End of Session - 03/03/22 0823     Visit Number 14    Number of Visits 25    Date for PT Re-Evaluation 03/27/22    Authorization Type BC/BS    PT Start Time 0814    PT Stop Time 9735    PT Time Calculation (min) 41 min    Activity Tolerance Patient tolerated treatment well                 Past Medical History:  Diagnosis Date   Achilles tendonitis, bilateral 2016   [name], DPM   Acute medial meniscus tear of right knee    Acute myocarditis 2015   viral vs d/t strep infection, resolved w/o sequela   Allergy    Ascending aortic aneurysm ([place]) 11/02/2021   Deformity of metatarsal    Bilateral, mild DJD at the right 1st MTP joint and bilateral decrease in calcaneal inclination angle   DVT (deep venous thrombosis) ([place])    GERD (gastroesophageal reflux disease)    Hemorrhoids    hx of   Hepatitis B infection    hx of dx 2005 aprox   Hyperglycemia 02/22/2015   Hypertension    Neck pain    local injections x 2, las summer 2011 (Dr.Vortek)   Plantar fasciitis, bilateral 2016   [name], DPM   Sleep apnea    on CPAP    Past Surgical History:  Procedure Laterality Date   INGUINAL HERNIA REPAIR     KNEE ARTHROSCOPY  6-15   Dr Mardelle Matte   KNEE ARTHROSCOPY WITH MEDIAL MENISECTOMY Right 09/26/2018   LEFT HEART CATHETERIZATION WITH CORONARY ANGIOGRAM N/A 12/29/2013   Procedure: LEFT HEART CATHETERIZATION WITH CORONARY ANGIOGRAM;  Surgeon: [name], MD;  Location: [facility] CATH LAB;  Service: Cardiovascular;  Laterality: N/A;   Patient Active Problem List   Diagnosis Date Noted   Ascending aortic aneurysm ([place]) 11/02/2021   Hyperglycemia 02/22/2015   PCP NOTES >>> 12/28/2014   OSA on CPAP 07/21/2014   Myocarditis ([place])    Hypertension    GERD (gastroesophageal reflux disease)    Hepatitis B  infection    Neck pain--Chronic 11/10/2013   Annual physical exam 03/29/2011    PCP: Colon Branch, MD  REFERRING PROVIDER: [name], MD  REFERRING DIAG:  I10 (ICD-10-CM) - Primary hypertension  I71.21 (ICD-10-CM) - Aneurysm of ascending aorta without rupture [facility])    Rationale for Evaluation and Treatment Rehabilitation  THERAPY DIAG:  Muscle weakness (generalized)  Cervicalgia  Sciatica, left side  Radiculopathy, cervical region  ONSET DATE: 2 yrs  SUBJECTIVE:  SUBJECTIVE STATEMENT: Pt states that he did ok after last land session.  His L neck remains tight, and the pain "has shifted back over to the Right".   PERTINENT HISTORY:  57 y.o. male with a hx of acute myocarditis, DVT, hypertension, prediabetes, GERD, and sleep apnea on CPAP,  enlarged aorta .  Low back with sciatica pain and cervical neck pain.  Cervical stenosis;  KNEE ARTHROSCOPY WITH MEDIAL MENISECTOMY Right 09/26/2018     PAIN:  Are you having pain? Yes : NPRS scale: current 5/10, 3/10  (see corresponding area below) Pain location: Left upper trap/neck; Rt hip (points to glute med area) Pain description: Aching Aggravating factors: Sitting for periods of time Relieving factors: lying down/sleeping   PRECAUTIONS: Cervical  WEIGHT BEARING RESTRICTIONS No  FALLS:  Has patient fallen in last 6 months? 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 Mild progression of central disc protrusion C3-4. Mild spinal stenosis and mild foraminal stenosis  bilaterally similar to the prior study 2. Moderate spinal stenosis with moderate foraminal stenosis bilaterally at C4-5 and C5-6 unchanged 3. Mild foraminal narrowing bilaterally C6-7 unchanged. 03/29/21 Lumbar spine MRI: Mild degenerative changes as detailed above  PATIENT SURVEYS:  FOTO 47 with goal of 55  SCREENING FOR RED FLAGS:  Neg COGNITION:  Overall cognitive status: Within functional limits for tasks assessed     UE ROM/STRENGTH ROM: Left shoulder; elbow wfl STR: Left shoulder ~4-/5 (plan to measure using dynamometer @ land based appointment)         LOWER EXTREMITY ROM:      Active  Right eval Left eval  Hip flexion 80 70 in supine  Hip extension 10 5d  Hip abduction      Hip adduction      Hip internal rotation wfl wfl  Hip external rotation Limited 25% Limited 50%  Knee flexion      Knee extension      Ankle dorsiflexion      Ankle plantarflexion      Ankle inversion      Ankle eversion       (Blank rows = WFL)   LOWER EXTREMITY MMT:     MMT Right eval Left eval Right  12/4 Left 12/4  Hip flexion 4 4 5/ 38.3 4+/ 31.4  Hip extension 4- 4-    Hip abduction 4 4 5/48.3 5/49.7  Hip adduction 5 5    Hip internal rotation wfl wfl    Hip external rotation wfl Limited no P!    Knee flexion 5 5    Knee extension 5 5 53.4 62.8  Ankle dorsiflexion        Ankle plantarflexion        Ankle inversion        Ankle eversion         (Blank rows = WFL)   LUMBAR SPECIAL TESTS:  Straight leg raise test: Negative, Slump test: Positive, and Thomas test: Negative  Cervical rotation L 65  Right 70    TODAY'S TREATMENT  03/03/22: Pt seen for aquatic therapy today.  Treatment took place in water 3.25-4.5 ft in depth at the [facility]. Temp of water was 92.  Pt entered/exited the pool via stairs with hand rail.  -  Walking forward, backward, and side stepping  - side stepping with shoulder addct with yellow hand floats R/L x 2 laps - unilateral  flex/ext (to neutral0-90) with yellow hand floats x 10 - staggered stance then wide stance with bilat shoulder horiz abdct/add with yellow hand floats - forward jogging x 6 widths - SLS with thin squoodle leg  press R/L 15 sec intervals fast/slow x 2-3 sets. No UE support -  TrA set with thin squoodle push down to thighs, 4 sec eccentric return x 10 - balance on thin squoodle (shoulder wide stance) then squats x 10,  - small range trunk rotation with kick board - hip swivel R/L with single resstance bell swing R/L -tricep press downs with yellow x 10, with blue x 10 - paloff press, Rider band,(hands on single band), 2 x 10 each side - resisted side stepping with light hydro band R/L ( at depth just past lift chair) x 2 widths each direction  - L stretch at stairs with knees flexed (modified childs pose) with gentle wag the tail x 2 reps  Pt requires the buoyancy and hydrostatic pressure of water for support, and to offload joints by unweighting joint load by at least 50 % in navel deep water and by at least 75-80% in chest to neck deep water.  Viscosity of the water is needed for resistance of strengthening. Water current perturbations provides challenge to standing balance requiring increased core activation.   02/28/2022 Cat/cow 2x10 2s hold Child's pose 5s 10x  Paloff press GTB 2x10  Clamshell 2x15 green  Supine bridge 2x15   Leg press 20 pounds 10x lifestyles Row machine 10x 20 pounds Cable extension 10x 10 pounds Cable row 10x 15 pounds  Gym recovery, exercise set up, strengthening technique Patient educated on proper set up of all machines   12/19 Supine march 2x15  Clamshell 2x15 green  Supine bridge 2x15   Leg press 40 pounds 3 x 15 lifestyles Row machine 3 x 15 20 pounds Cable extension 3 x 15 10 pounds Cable row 3 x 15 10 pounds  All exercises done with cueing for proper posture and core breathing. Patient educated on proper set up of all machines   12/14: Pt  seen for aquatic therapy today.  Treatment took place in water 3.25-4.5 ft in depth at the [facility]. Temp of water was 92.  Pt entered/exited the pool via stairs with hand rail.  -Walking forward, back, sidestepping in 4.42f - staggered stance then wide stance with reciprocal arm swing with resistance bells, 2 sets slow then fast 20 sec - wide stace with bilat horiz shoulder abdct/add 2 sets slow, then fast 20 sec -forward jogging hand bells for resistance x 6 widths -SLS with sm sq noodle leg  press R/L 15 sec intervals fast/slow x 2-3 sets. No ue support - balance on thin squoodle wide stance then feet together - -tandem wide stance (good challenge) - UE press downs at side with blue hand buoys, 3 sec pause in scap depression - TrA set with blue hand buoys push down, then isometric hold  - seated on 3rd step, fig 4 stretch, then piriformis stretch pulling knee to opp shoulder - gastroc stretch with heel off of step (bilat, then unilateral), then unilateral soleus stretch  - UE with  hand floats on surface with hip abdct/add x 10 each   Pt requires the buoyancy and hydrostatic pressure of water for support, and to offload joints by unweighting joint  load by at least 50 % in navel deep water and by at least 75-80% in chest to neck deep water.  Viscosity of the water is needed for resistance of strengthening. Water current perturbations provides challenge to standing balance requiring increased core activation.   Previous: land 12/4  Reviewed testing measures including strength and range of motion. Self-care: Reviewed how to use his home exercise program reviewed this difference between stretches and exercises.  Talked with patient about what he thinks will be the best as far as him his ability to be able to do some of these activities.  Self soft tissue mobilization using Thera cane and tennis ball.  Upper trap stretch 2 x 20 seconds for left Gluteal stretch 3 x 20  seconds seated  Standing row green 3 x 10 with cueing for posture Shoulder extension 3 x 10 with cueing for posture   PATIENT EDUCATION:  Education details: aquatic progressions/ modifications  Person educated: Patient Education method: Explanation Education comprehension: verbalized understanding   HOME EXERCISE PROGRAM:  Access Code: [email or web address] URL: [email or web address] Date: 12/23/2021 Prepared by: Daleen Bo  ASSESSMENT:  CLINICAL IMPRESSION: Pt tolerated all exercises well, without increase in pain.  He requires some cues on posture and trunk control  during session.  Plan to progress with gym based strength at next gym session. Pt does lack motor control of lumbar region, consider addition of more motor control/coordination at next. Pt will continue to benefit from skilled PT intervention to maximize mobility with less pain.   OBJECTIVE IMPAIRMENTS cardiopulmonary status limiting activity, difficulty walking, decreased ROM, decreased strength, impaired flexibility, impaired sensation, and pain.   ACTIVITY LIMITATIONS carrying, lifting, bending, sitting, standing, squatting, stairs, and reach over head  PARTICIPATION LIMITATIONS: meal prep, cleaning, laundry, shopping, community activity, occupation, and yard work  PERSONAL FACTORS HTN, aortic aneurism  are also affecting patient's functional outcome.   REHAB POTENTIAL: Good  CLINICAL DECISION MAKING: Unstable/unpredictable  EVALUATION COMPLEXITY: High   GOALS: Goals reviewed with patient? Yes  SHORT TERM GOALS: Target date: 12/23/21  Pt reports compliance with HEP Baseline: pt reports inconsistent compliance  Goal status: Partially met -01/18/22  2.  Left shoulder strength to be tested using dynamometer '@land'$  based appointment Baseline: MMT completed Goal status: achived   3.  Pt will have decreased frequency of P&N/paresthesias into left foot. 5/7days Baseline: 0/7 days for 3 wks Goal status:  Achieved -improved pain and foot achieved 12 /4  4.  Pt will report decrease in pain sx after aquatic session lasting at least a few hours. Baseline:  Goal status: Achieved -improved pain with aquatic therapy 12/4   LONG TERM GOALS: Target date: 01/20/22  Pt to meet FOTO goal of 55 Baseline: 47 Goal status: 49 and 55  2.  Pt to be indep with final aquatic/land based HEP Baseline:  Goal status: Progressing towards final HEP 12/4  3.  Pt will be able to complete standing ADL's without pain Baseline: 4/10 pain with ironing and grocery shopping  Goal status: Ongoing -   4.  Max pain to decrease to 5/10 or< to demonstrate improved management of and controlled pain Baseline: 6/10 max  Goal status:Ongoing - 01/18/22    PLAN: PT FREQUENCY: 1-2x/week  PT DURATION: 8 weeks  PLANNED INTERVENTIONS: Therapeutic exercises, Therapeutic activity, Neuromuscular re-education, Balance training, Gait training, Patient/Family education, Self Care, Joint mobilization, Joint manipulation, Stair training, Aquatic Therapy, Dry Needling, Electrical stimulation, Spinal manipulation, Spinal mobilization, Cryotherapy, Moist heat, Taping, Manual therapy, and Re-evaluation.  PLAN FOR NEXT SESSION:   continue flexibility and core strengthening. Encourage compliance with HEP.   Kerin Perna, PTA 03/03/22 12:57 PM [facility] [address] [place], [place], [address] Phone: [phone]   Fax:  [phone]

## 2022-03-06 ENCOUNTER — Ambulatory Visit (HOSPITAL_BASED_OUTPATIENT_CLINIC_OR_DEPARTMENT_OTHER): Payer: BC Managed Care – PPO | Admitting: Physical Therapy

## 2022-03-09 ENCOUNTER — Ambulatory Visit (HOSPITAL_BASED_OUTPATIENT_CLINIC_OR_DEPARTMENT_OTHER): Payer: BC Managed Care – PPO | Admitting: Physical Therapy

## 2022-03-14 ENCOUNTER — Ambulatory Visit (HOSPITAL_BASED_OUTPATIENT_CLINIC_OR_DEPARTMENT_OTHER): Payer: BC Managed Care – PPO | Admitting: Physical Therapy

## 2022-03-17 ENCOUNTER — Ambulatory Visit (HOSPITAL_BASED_OUTPATIENT_CLINIC_OR_DEPARTMENT_OTHER): Payer: BC Managed Care – PPO | Admitting: Physical Therapy

## 2022-03-17 ENCOUNTER — Encounter (HOSPITAL_BASED_OUTPATIENT_CLINIC_OR_DEPARTMENT_OTHER): Payer: Self-pay | Admitting: Physical Therapy

## 2022-03-17 DIAGNOSIS — M542 Cervicalgia: Secondary | ICD-10-CM

## 2022-03-17 DIAGNOSIS — M6281 Muscle weakness (generalized): Secondary | ICD-10-CM

## 2022-03-17 DIAGNOSIS — M5432 Sciatica, left side: Secondary | ICD-10-CM

## 2022-03-17 DIAGNOSIS — M5412 Radiculopathy, cervical region: Secondary | ICD-10-CM

## 2022-03-17 NOTE — Therapy (Signed)
OUTPATIENT PHYSICAL THERAPY THORACOLUMBAR TREATMENT   Patient Name: Gregory Klein MRN: [id number] DOB:1966-02-11, 57 y.o., male Today's Date: 03/17/2022   PT End of Session - 03/17/22 0815     Visit Number 15    Number of Visits 25    Date for PT Re-Evaluation 03/27/22    Authorization Type BC/BS    PT Start Time 0815    PT Stop Time 6160    PT Time Calculation (min) 40 min    Activity Tolerance Patient tolerated treatment well    Behavior During Therapy [facility] for tasks assessed/performed                 Past Medical History:  Diagnosis Date   Achilles tendonitis, bilateral 2016   [name], DPM   Acute medial meniscus tear of right knee    Acute myocarditis 2015   viral vs d/t strep infection, resolved w/o sequela   Allergy    Ascending aortic aneurysm ([place]) 11/02/2021   Deformity of metatarsal    Bilateral, mild DJD at the right 1st MTP joint and bilateral decrease in calcaneal inclination angle   DVT (deep venous thrombosis) ([facility])    GERD (gastroesophageal reflux disease)    Hemorrhoids    hx of   Hepatitis B infection    hx of dx 2005 aprox   Hyperglycemia 02/22/2015   Hypertension    Neck pain    local injections x 2, las summer 2011 (Dr.Vortek)   Plantar fasciitis, bilateral 2016   [name], DPM   Sleep apnea    on CPAP    Past Surgical History:  Procedure Laterality Date   INGUINAL HERNIA REPAIR     KNEE ARTHROSCOPY  6-15   Dr Mardelle Matte   KNEE ARTHROSCOPY WITH MEDIAL MENISECTOMY Right 09/26/2018   LEFT HEART CATHETERIZATION WITH CORONARY ANGIOGRAM N/A 12/29/2013   Procedure: LEFT HEART CATHETERIZATION WITH CORONARY ANGIOGRAM;  Surgeon: [name], MD;  Location: [facility] CATH LAB;  Service: Cardiovascular;  Laterality: N/A;   Patient Active Problem List   Diagnosis Date Noted   Ascending aortic aneurysm ([place]) 11/02/2021   Hyperglycemia 02/22/2015   PCP NOTES >>> 12/28/2014   OSA on CPAP 07/21/2014   Myocarditis ([place])    Hypertension     GERD (gastroesophageal reflux disease)    Hepatitis B infection    Neck pain--Chronic 11/10/2013   Annual physical exam 03/29/2011    PCP: Colon Branch, MD  REFERRING PROVIDER: [name], MD  REFERRING DIAG:  I10 (ICD-10-CM) - Primary hypertension  I71.21 (ICD-10-CM) - Aneurysm of ascending aorta without rupture [facility])    Rationale for Evaluation and Treatment Rehabilitation  THERAPY DIAG:  Muscle weakness (generalized)  Cervicalgia  Sciatica, left side  Radiculopathy, cervical region  ONSET DATE: 2 yrs  SUBJECTIVE:  SUBJECTIVE STATEMENT: Pt states that had to cancel last appt due to bad traffic.  "I'm doing pretty good today".   PERTINENT HISTORY:  57 y.o. male with a hx of acute myocarditis, DVT, hypertension, prediabetes, GERD, and sleep apnea on CPAP,  enlarged aorta .  Low back with sciatica pain and cervical neck pain.  Cervical stenosis;  KNEE ARTHROSCOPY WITH MEDIAL MENISECTOMY Right 09/26/2018     PAIN:  Are you having pain? Yes : NPRS scale: ,3/10  Pain location: Rt hip and lower back (points to glute med/ QL area) Pain description: Aching Aggravating factors: Sitting for periods of time Relieving factors: lying down/sleeping   PRECAUTIONS: Cervical  WEIGHT BEARING RESTRICTIONS No  FALLS:  Has patient fallen in last 6 months? 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 Mild progression of central disc protrusion C3-4. Mild spinal stenosis and mild foraminal stenosis bilaterally similar to  the prior study 2. Moderate spinal stenosis with moderate foraminal stenosis bilaterally at C4-5 and C5-6 unchanged 3. Mild foraminal narrowing bilaterally C6-7 unchanged. 03/29/21 Lumbar spine MRI: Mild degenerative changes as detailed above  PATIENT SURVEYS:  FOTO 47 with goal of 55  SCREENING FOR RED FLAGS:  Neg COGNITION:  Overall cognitive status: Within functional limits for tasks assessed     UE ROM/STRENGTH ROM: Left shoulder; elbow wfl STR: Left shoulder ~4-/5 (plan to measure using dynamometer @ land based appointment)         LOWER EXTREMITY ROM:      Active  Right eval Left eval  Hip flexion 80 70 in supine  Hip extension 10 5d  Hip abduction      Hip adduction      Hip internal rotation wfl wfl  Hip external rotation Limited 25% Limited 50%  Knee flexion      Knee extension      Ankle dorsiflexion      Ankle plantarflexion      Ankle inversion      Ankle eversion       (Blank rows = WFL)   LOWER EXTREMITY MMT:     MMT Right eval Left eval Right  12/4 Left 12/4  Hip flexion 4 4 5/ 38.3 4+/ 31.4  Hip extension 4- 4-    Hip abduction 4 4 5/48.3 5/49.7  Hip adduction 5 5    Hip internal rotation wfl wfl    Hip external rotation wfl Limited no P!    Knee flexion 5 5    Knee extension 5 5 53.4 62.8  Ankle dorsiflexion        Ankle plantarflexion        Ankle inversion        Ankle eversion         (Blank rows = WFL)   LUMBAR SPECIAL TESTS:  Straight leg raise test: Negative, Slump test: Positive, and Thomas test: Negative  Cervical rotation L 65  Right 70    TODAY'S TREATMENT  03/17/22: Pt seen for aquatic therapy today.  Treatment took place in water 3.25-4.5 ft in depth at the [facility]. Temp of water was 92.  Pt entered/exited the pool via stairs with hand rail.  -Walking forward, backward, and side stepping  - side stepping with shoulder addct  with yellow hand floats R/L x 2 laps - unilateral flex/ext (to  neutral0-90) with yellow hand floats x 10 - staggered stance with unilateral/ bilat shoulder horiz abdct/add with yellow hand floats - tricep press down with eccentric return to surface x 15 - forward/backward jogging x 6 widths - SLS with thin squoodle leg  press R/L 10 slow, 15 quick. No UE support, 2 sets -  TrA set with thin squoodle push down to thighs, 4 sec eccentric return x 15 - high knee marching with reciprocal arm swing backwards/forwards  - paloff press, Rider band,(hands on single band), x 10 each side (side step, press out/in, step to neutral) - R/L hamstring stretch with foot on 2nd step, 15s x 2 each LE - L stretch at stairs with knees flexed (modified childs pose) with gentle wag the tail x 2 reps  Pt requires the buoyancy and hydrostatic pressure of water for support, and to offload joints by unweighting joint load by at least 50 % in navel deep water and by at least 75-80% in chest to neck deep water.  Viscosity of the water is needed for resistance of strengthening. Water current perturbations provides challenge to standing balance requiring increased core activation.   Previous: land 02/28/22 Cat/cow 2x10 2s hold Child's pose 5s 10x  Paloff press GTB 2x10  Clamshell 2x15 green  Supine bridge 2x15   Leg press 20 pounds 10x lifestyles Row machine 10x 20 pounds Cable extension 10x 10 pounds Cable row 10x 15 pounds  Gym recovery, exercise set up, strengthening technique Patient educated on proper set up of all machines  PATIENT EDUCATION:  Education details: aquatic progressions/ modifications  Person educated: Patient Education method: Explanation Education comprehension: verbalized understanding   HOME EXERCISE PROGRAM:  Access Code: [email or web address] URL: [email or web address] Date: 12/23/2021 Prepared by: Daleen Bo  ASSESSMENT:  CLINICAL IMPRESSION: Pt tolerated all exercises well, without increase in pain.  He requires some cues on posture  and trunk control  during session. Encouraged compliance with improved posture at work station and [employer].  Pt plans to join [place]; will discuss plans for this at next visit.     Plan to progress with gym based strength at next gym session. Pt does lack motor control of lumbar region, consider addition of more motor control/coordination at next. Pt will continue to benefit from skilled PT intervention to maximize mobility with less pain.   OBJECTIVE IMPAIRMENTS cardiopulmonary status limiting activity, difficulty walking, decreased ROM, decreased strength, impaired flexibility, impaired sensation, and pain.   ACTIVITY LIMITATIONS carrying, lifting, bending, sitting, standing, squatting, stairs, and reach over head  PARTICIPATION LIMITATIONS: meal prep, cleaning, laundry, shopping, community activity, occupation, and yard work  PERSONAL FACTORS HTN, aortic aneurism  are also affecting patient's functional outcome.   REHAB POTENTIAL: Good  CLINICAL DECISION MAKING: Unstable/unpredictable  EVALUATION COMPLEXITY: High   GOALS: Goals reviewed with patient? Yes  SHORT TERM GOALS: Target date: 12/23/21  Pt reports compliance with HEP Baseline: pt reports inconsistent compliance  Goal status: Partially met -01/18/22  2.  Left shoulder strength to be tested using dynamometer '@land'$  based appointment Baseline: MMT completed Goal status: achived   3.  Pt will have decreased frequency of P&N/paresthesias into left foot. 5/7days Baseline: 0/7 days for 3 wks Goal status: Achieved -improved pain and foot achieved 12 /4  4.  Pt will report decrease in pain sx after aquatic session lasting at least a few hours. Baseline:  Goal status: Achieved -improved pain with aquatic therapy 12/4  LONG TERM GOALS: Target date: 01/20/22  Pt to meet FOTO goal of 55 Baseline: 47 Goal status: 49 and 55  2.  Pt to be indep with final aquatic/land based HEP Baseline:  Goal status: Progressing towards  final HEP 12/4  3.  Pt will be able to complete standing ADL's without pain Baseline: 4/10 pain with ironing and grocery shopping  Goal status: Ongoing -   4.  Max pain to decrease to 5/10 or< to demonstrate improved management of and controlled pain Baseline: 6/10 max  Goal status:Ongoing - 01/18/22    PLAN: PT FREQUENCY: 1-2x/week  PT DURATION: 8 weeks  PLANNED INTERVENTIONS: Therapeutic exercises, Therapeutic activity, Neuromuscular re-education, Balance training, Gait training, Patient/Family education, Self Care, Joint mobilization, Joint manipulation, Stair training, Aquatic Therapy, Dry Needling, Electrical stimulation, Spinal manipulation, Spinal mobilization, Cryotherapy, Moist heat, Taping, Manual therapy, and Re-evaluation.  PLAN FOR NEXT SESSION:   continue flexibility and core strengthening. Encourage compliance with HEP.   Issue laminated HEP at next aquatic visit.   Kerin Perna, PTA 03/17/22 8:59 AM [facility] [address] [place], [place], [address] Phone: [phone]   Fax:  [phone]

## 2022-03-20 ENCOUNTER — Encounter (HOSPITAL_BASED_OUTPATIENT_CLINIC_OR_DEPARTMENT_OTHER): Payer: Self-pay | Admitting: Physical Therapy

## 2022-03-20 ENCOUNTER — Ambulatory Visit (HOSPITAL_BASED_OUTPATIENT_CLINIC_OR_DEPARTMENT_OTHER): Payer: BC Managed Care – PPO | Admitting: Physical Therapy

## 2022-03-20 DIAGNOSIS — M5412 Radiculopathy, cervical region: Secondary | ICD-10-CM

## 2022-03-20 DIAGNOSIS — M542 Cervicalgia: Secondary | ICD-10-CM

## 2022-03-20 DIAGNOSIS — M5432 Sciatica, left side: Secondary | ICD-10-CM

## 2022-03-20 DIAGNOSIS — M6281 Muscle weakness (generalized): Secondary | ICD-10-CM

## 2022-03-20 NOTE — Therapy (Signed)
OUTPATIENT PHYSICAL THERAPY THORACOLUMBAR TREATMENT   Patient Name: Gregory Klein MRN: [id number] DOB:Nov 01, 1965, 57 y.o., male Today's Date: 03/20/2022   PT End of Session - 03/20/22 0841     Visit Number 16    Number of Visits 25    Date for PT Re-Evaluation 03/27/22    Authorization Type BC/BS    PT Start Time 0803    PT Stop Time 0838    PT Time Calculation (min) 35 min    Activity Tolerance Patient tolerated treatment well    Behavior During Therapy [facility] for tasks assessed/performed                  Past Medical History:  Diagnosis Date   Achilles tendonitis, bilateral 2016   [name], DPM   Acute medial meniscus tear of right knee    Acute myocarditis 2015   viral vs d/t strep infection, resolved w/o sequela   Allergy    Ascending aortic aneurysm ([place]) 11/02/2021   Deformity of metatarsal    Bilateral, mild DJD at the right 1st MTP joint and bilateral decrease in calcaneal inclination angle   DVT (deep venous thrombosis) (HCC)    GERD (gastroesophageal reflux disease)    Hemorrhoids    hx of   Hepatitis B infection    hx of dx 2005 aprox   Hyperglycemia 02/22/2015   Hypertension    Neck pain    local injections x 2, las summer 2011 (Dr.Vortek)   Plantar fasciitis, bilateral 2016   [name], DPM   Sleep apnea    on CPAP    Past Surgical History:  Procedure Laterality Date   INGUINAL HERNIA REPAIR     KNEE ARTHROSCOPY  6-15   Dr Mardelle Matte   KNEE ARTHROSCOPY WITH MEDIAL MENISECTOMY Right 09/26/2018   LEFT HEART CATHETERIZATION WITH CORONARY ANGIOGRAM N/A 12/29/2013   Procedure: LEFT HEART CATHETERIZATION WITH CORONARY ANGIOGRAM;  Surgeon: [name], MD;  Location: [facility] CATH LAB;  Service: Cardiovascular;  Laterality: N/A;   Patient Active Problem List   Diagnosis Date Noted   Ascending aortic aneurysm ([place]) 11/02/2021   Hyperglycemia 02/22/2015   PCP NOTES >>> 12/28/2014   OSA on CPAP 07/21/2014   Myocarditis ([place])    Hypertension     GERD (gastroesophageal reflux disease)    Hepatitis B infection    Neck pain--Chronic 11/10/2013   Annual physical exam 03/29/2011    PCP: Colon Branch, MD  REFERRING PROVIDER: [name], MD  REFERRING DIAG:  I10 (ICD-10-CM) - Primary hypertension  I71.21 (ICD-10-CM) - Aneurysm of ascending aorta without rupture [facility])    Rationale for Evaluation and Treatment Rehabilitation  THERAPY DIAG:  Muscle weakness (generalized)  Cervicalgia  Sciatica, left side  Radiculopathy, cervical region  ONSET DATE: 2 yrs  SUBJECTIVE:  SUBJECTIVE STATEMENT: Pt states that he is doing pretty well. He states he has been non-compliant with HEP. The neck still bothers him. Back pain is gone  PERTINENT HISTORY:  57 y.o. male with a hx of acute myocarditis, DVT, hypertension, prediabetes, GERD, and sleep apnea on CPAP,  enlarged aorta .  Low back with sciatica pain and cervical neck pain.  Cervical stenosis;  KNEE ARTHROSCOPY WITH MEDIAL MENISECTOMY Right 09/26/2018     PAIN:  Are you having pain? Yes : NPRS scale: ,3/10  Pain location: L UT Pain description: Aching Aggravating factors: Sitting for periods of time Relieving factors: lying down/sleeping   PRECAUTIONS: Cervical  WEIGHT BEARING RESTRICTIONS No  FALLS:  Has patient fallen in last 6 months? 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 Mild progression of central disc protrusion C3-4. Mild spinal stenosis and mild foraminal stenosis bilaterally similar to the prior  study 2. Moderate spinal stenosis with moderate foraminal stenosis bilaterally at C4-5 and C5-6 unchanged 3. Mild foraminal narrowing bilaterally C6-7 unchanged. 03/29/21 Lumbar spine MRI: Mild degenerative changes as detailed above  PATIENT SURVEYS:  FOTO 47 with goal of 55  SCREENING FOR RED FLAGS:  Neg COGNITION:  Overall cognitive status: Within functional limits for tasks assessed     UE ROM/STRENGTH ROM: Left shoulder; elbow wfl STR: Left shoulder ~4-/5 (plan to measure using dynamometer @ land based appointment)        LOWER EXTREMITY ROM:      Active  Right eval Left eval  Hip flexion 80 70 in supine  Hip extension 10 5d  Hip abduction      Hip adduction      Hip internal rotation wfl wfl  Hip external rotation Limited 25% Limited 50%  Knee flexion      Knee extension      Ankle dorsiflexion      Ankle plantarflexion      Ankle inversion      Ankle eversion       (Blank rows = WFL)   LOWER EXTREMITY MMT:     MMT Right eval Left eval Right  12/4 Left 12/4  Hip flexion 4 4 5/ 38.3 4+/ 31.4  Hip extension 4- 4-    Hip abduction 4 4 5/48.3 5/49.7  Hip adduction 5 5    Hip internal rotation wfl wfl    Hip external rotation wfl Limited no P!    Knee flexion 5 5    Knee extension 5 5 53.4 62.8  Ankle dorsiflexion        Ankle plantarflexion        Ankle inversion        Ankle eversion         (Blank rows = WFL)    Cervical rotation L 65  Right 70    TODAY'S TREATMENT   1/22  L UT STM  Chin nod 3s 15x Seated chin tuck 2s 10x Levator stretch bilat 30s 2x each Seated RTB ER 3x10 Wall pec stretch 30s 2s  03/17/22: Pt seen for aquatic therapy today.  Treatment took place in water 3.25-4.5 ft in depth at the [place]. Temp of water was 92.  Pt entered/exited the pool via stairs with hand rail.  -Walking  forward, backward, and side stepping  - side stepping with shoulder addct with yellow hand floats R/L x 2 laps - unilateral  flex/ext (to neutral0-90) with yellow hand floats x 10 - staggered stance with unilateral/ bilat shoulder horiz abdct/add with yellow hand floats - tricep press down with eccentric return to surface x 15 - forward/backward jogging x 6 widths - SLS with thin squoodle leg  press R/L 10 slow, 15 quick. No UE support, 2 sets -  TrA set with thin squoodle push down to thighs, 4 sec eccentric return x 15 - high knee marching with reciprocal arm swing backwards/forwards  - paloff press, Rider band,(hands on single band), x 10 each side (side step, press out/in, step to neutral) - R/L hamstring stretch with foot on 2nd step, 15s x 2 each LE - L stretch at stairs with knees flexed (modified childs pose) with gentle wag the tail x 2 reps  Pt requires the buoyancy and hydrostatic pressure of water for support, and to offload joints by unweighting joint load by at least 50 % in navel deep water and by at least 75-80% in chest to neck deep water.  Viscosity of the water is needed for resistance of strengthening. Water current perturbations provides challenge to standing balance requiring increased core activation.   Previous: land 02/28/22 Cat/cow 2x10 2s hold Child's pose 5s 10x  Paloff press GTB 2x10  Clamshell 2x15 green  Supine bridge 2x15   Leg press 20 pounds 10x lifestyles Row machine 10x 20 pounds Cable extension 10x 10 pounds Cable row 10x 15 pounds  Gym recovery, exercise set up, strengthening technique Patient educated on proper set up of all machines  PATIENT EDUCATION:  Education details: aquatic progressions/ modifications  Person educated: Patient Education method: Explanation Education comprehension: verbalized understanding   HOME EXERCISE PROGRAM:  Access Code: [email or web address] URL: [email or web address] Date: 12/23/2021 Prepared by: Daleen Bo  ASSESSMENT:  CLINICAL IMPRESSION: Pt presents with L sided neck soft tissue hypertonicity but  had improved soft  tissue extensibility following manual and exercise. Session focused on maximizing anterior shoulder mobility and posterior strength as pt has a desk related job and sits with fwd head and rounded shoulders. HEP compliance reinforced during session and HEP streamlined in order to promote exercise at home versus gym session. Pt plan to talk to gym staff to discuss financial obligations with gym membership. No gym based movements progressed at this time. Pt to have land reassessment on 1/29 to determine further need for therapy. Pt will continue to benefit from skilled PT intervention to maximize mobility with less pain.   OBJECTIVE IMPAIRMENTS cardiopulmonary status limiting activity, difficulty walking, decreased ROM, decreased strength, impaired flexibility, impaired sensation, and pain.   ACTIVITY LIMITATIONS carrying, lifting, bending, sitting, standing, squatting, stairs, and reach over head  PARTICIPATION LIMITATIONS: meal prep, cleaning, laundry, shopping, community activity, occupation, and yard work  PERSONAL FACTORS HTN, aortic aneurism  are also affecting patient's functional outcome.   REHAB POTENTIAL: Good  CLINICAL DECISION MAKING: Unstable/unpredictable  EVALUATION COMPLEXITY: High   GOALS: Goals reviewed with patient? Yes  SHORT TERM GOALS: Target date: 12/23/21  Pt reports compliance with HEP Baseline: pt reports inconsistent compliance  Goal status: Partially met -01/18/22  2.  Left shoulder strength to be tested using dynamometer '@land'$  based appointment Baseline: MMT completed Goal status: achived   3.  Pt will have decreased frequency of P&N/paresthesias into left foot. 5/7days Baseline: 0/7 days for 3 wks Goal status: Achieved -improved pain and foot  achieved 12 /4  4.  Pt will report decrease in pain sx after aquatic session lasting at least a few hours. Baseline:  Goal status: Achieved -improved pain with aquatic therapy 12/4   LONG TERM GOALS: Target date:  01/20/22  Pt to meet FOTO goal of 55 Baseline: 47 Goal status: 49 and 55  2.  Pt to be indep with final aquatic/land based HEP Baseline:  Goal status: Progressing towards final HEP 12/4  3.  Pt will be able to complete standing ADL's without pain Baseline: 4/10 pain with ironing and grocery shopping  Goal status: Ongoing -   4.  Max pain to decrease to 5/10 or< to demonstrate improved management of and controlled pain Baseline: 6/10 max  Goal status:Ongoing - 01/18/22    PLAN: PT FREQUENCY: 1-2x/week  PT DURATION: 8 weeks  PLANNED INTERVENTIONS: Therapeutic exercises, Therapeutic activity, Neuromuscular re-education, Balance training, Gait training, Patient/Family education, Self Care, Joint mobilization, Joint manipulation, Stair training, Aquatic Therapy, Dry Needling, Electrical stimulation, Spinal manipulation, Spinal mobilization, Cryotherapy, Moist heat, Taping, Manual therapy, and Re-evaluation.  PLAN FOR NEXT SESSION:   Issue laminated HEP at next aquatic visit; plan for land reassessment 1/29 for potential D/C depending on exam findings  Daleen Bo PT, DPT 03/20/22 8:42 AM

## 2022-03-23 ENCOUNTER — Encounter (HOSPITAL_BASED_OUTPATIENT_CLINIC_OR_DEPARTMENT_OTHER): Payer: Self-pay | Admitting: Physical Therapy

## 2022-03-23 ENCOUNTER — Ambulatory Visit (HOSPITAL_BASED_OUTPATIENT_CLINIC_OR_DEPARTMENT_OTHER): Payer: BC Managed Care – PPO | Admitting: Physical Therapy

## 2022-03-23 DIAGNOSIS — M5432 Sciatica, left side: Secondary | ICD-10-CM

## 2022-03-23 DIAGNOSIS — M6281 Muscle weakness (generalized): Secondary | ICD-10-CM

## 2022-03-23 DIAGNOSIS — M5412 Radiculopathy, cervical region: Secondary | ICD-10-CM

## 2022-03-23 DIAGNOSIS — M542 Cervicalgia: Secondary | ICD-10-CM

## 2022-03-23 NOTE — Therapy (Signed)
OUTPATIENT PHYSICAL THERAPY THORACOLUMBAR TREATMENT   Patient Name: Gregory Klein MRN: [id number] DOB:January 04, 1966, 57 y.o., male Today's Date: 03/23/2022   PT End of Session - 03/23/22 0816     Visit Number 17    Number of Visits 25    Date for PT Re-Evaluation 03/27/22    Authorization Type BC/BS    PT Start Time 0813    PT Stop Time 1950    PT Time Calculation (min) 42 min    Activity Tolerance Patient tolerated treatment well    Behavior During Therapy [facility] for tasks assessed/performed                  Past Medical History:  Diagnosis Date   Achilles tendonitis, bilateral 2016   [name], DPM   Acute medial meniscus tear of right knee    Acute myocarditis 2015   viral vs d/t strep infection, resolved w/o sequela   Allergy    Ascending aortic aneurysm ([place]) 11/02/2021   Deformity of metatarsal    Bilateral, mild DJD at the right 1st MTP joint and bilateral decrease in calcaneal inclination angle   DVT (deep venous thrombosis) (HCC)    GERD (gastroesophageal reflux disease)    Hemorrhoids    hx of   Hepatitis B infection    hx of dx 2005 aprox   Hyperglycemia 02/22/2015   Hypertension    Neck pain    local injections x 2, las summer 2011 (Dr.Vortek)   Plantar fasciitis, bilateral 2016   [name], DPM   Sleep apnea    on CPAP    Past Surgical History:  Procedure Laterality Date   INGUINAL HERNIA REPAIR     KNEE ARTHROSCOPY  6-15   Dr Mardelle Matte   KNEE ARTHROSCOPY WITH MEDIAL MENISECTOMY Right 09/26/2018   LEFT HEART CATHETERIZATION WITH CORONARY ANGIOGRAM N/A 12/29/2013   Procedure: LEFT HEART CATHETERIZATION WITH CORONARY ANGIOGRAM;  Surgeon: [name], MD;  Location: [facility] CATH LAB;  Service: Cardiovascular;  Laterality: N/A;   Patient Active Problem List   Diagnosis Date Noted   Ascending aortic aneurysm ([place]) 11/02/2021   Hyperglycemia 02/22/2015   PCP NOTES >>> 12/28/2014   OSA on CPAP 07/21/2014   Myocarditis ([place])    Hypertension     GERD (gastroesophageal reflux disease)    Hepatitis B infection    Neck pain--Chronic 11/10/2013   Annual physical exam 03/29/2011    PCP: Colon Branch, MD  REFERRING PROVIDER: [name], MD  REFERRING DIAG:  I10 (ICD-10-CM) - Primary hypertension  I71.21 (ICD-10-CM) - Aneurysm of ascending aorta without rupture [facility])    Rationale for Evaluation and Treatment Rehabilitation  THERAPY DIAG:  Muscle weakness (generalized)  Cervicalgia  Sciatica, left side  Radiculopathy, cervical region  ONSET DATE: 2 yrs  SUBJECTIVE:  SUBJECTIVE STATEMENT: "I'm doing pretty good."  Pt reports he still wakes up from pain at Rt lower back (rubs top of ilium/SI area)  PERTINENT HISTORY:  57 y.o. male with a hx of acute myocarditis, DVT, hypertension, prediabetes, GERD, and sleep apnea on CPAP,  enlarged aorta .  Low back with sciatica pain and cervical neck pain.  Cervical stenosis;  KNEE ARTHROSCOPY WITH MEDIAL MENISECTOMY Right 09/26/2018     PAIN:  Are you having pain? No  : NPRS scale: 0/10 Pain location: Pain description:  Aggravating factors: Sitting for periods of time Relieving factors: lying down/sleeping   PRECAUTIONS: Cervical  WEIGHT BEARING RESTRICTIONS No  FALLS:  Has patient fallen in last 6 months? 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 Mild progression of central disc protrusion C3-4. Mild spinal stenosis and mild foraminal stenosis bilaterally similar to the prior study 2. Moderate spinal stenosis with  moderate foraminal stenosis bilaterally at C4-5 and C5-6 unchanged 3. Mild foraminal narrowing bilaterally C6-7 unchanged. 03/29/21 Lumbar spine MRI: Mild degenerative changes as detailed above  PATIENT SURVEYS:  FOTO 47 with goal of 55  SCREENING FOR RED FLAGS:  Neg COGNITION:  Overall cognitive status: Within functional limits for tasks assessed     UE ROM/STRENGTH ROM: Left shoulder; elbow wfl STR: Left shoulder ~4-/5 (plan to measure using dynamometer @ land based appointment)        LOWER EXTREMITY ROM:      Active  Right eval Left eval  Hip flexion 80 70 in supine  Hip extension 10 5d  Hip abduction      Hip adduction      Hip internal rotation wfl wfl  Hip external rotation Limited 25% Limited 50%  Knee flexion      Knee extension      Ankle dorsiflexion      Ankle plantarflexion      Ankle inversion      Ankle eversion       (Blank rows = WFL)   LOWER EXTREMITY MMT:     MMT Right eval Left eval Right  12/4 Left 12/4  Hip flexion 4 4 5/ 38.3 4+/ 31.4  Hip extension 4- 4-    Hip abduction 4 4 5/48.3 5/49.7  Hip adduction 5 5    Hip internal rotation wfl wfl    Hip external rotation wfl Limited no P!    Knee flexion 5 5    Knee extension 5 5 53.4 62.8  Ankle dorsiflexion        Ankle plantarflexion        Ankle inversion        Ankle eversion         (Blank rows = WFL)    Cervical rotation L 65  Right 70    TODAY'S TREATMENT  03/23/22: Pt seen for aquatic therapy today.  Treatment took place in water 3.25-4.5 ft in depth at the [place]. Temp of water was 92.  Pt entered/exited the pool via stairs with hand rail.  -Walking forward, backward for warm up - seated piriformis stretch on stairs;  standing hamstring stretch with foot on 2nd step x 45-60sec x 2 each LE -TrA set with short blue noodle -> kick board -> rainbow  hand floats   - side stepping with shoulder addct with rainbow -> yellow hand floats R/L x 3 laps -  staggered stance with unilateral/ bilat shoulder horiz abdct/add with yellow hand floats - tricep press down with blue hand floats, eccentric return to surface x 10, yellow x 10  - single yellow hand float at side under water with forward/ backward walk- 4 laps total - light jog forward  - staggered stance with kick board row x 10 each LE forward followed by gentle trunk rotation for stretch - hip hinge with forward arm reach with hands on kick board x 10  - SLS with thin squoodle leg  press R/L 10 slow, 15 quick. With/ without UE support, 2 sets - L stretch at stairs with knees flexed (modified childs pose) with gentle wag the tail x 2 reps  Pt requires the buoyancy and hydrostatic pressure of water for support, and to offload joints by unweighting joint load by at least 50 % in navel deep water and by at least 75-80% in chest to neck deep water.  Viscosity of the water is needed for resistance of strengthening. Water current perturbations provides challenge to standing balance requiring increased core activation. 1/22  L UT STM  Chin nod 3s 15x Seated chin tuck 2s 10x Levator stretch bilat 30s 2x each Seated RTB ER 3x10 Wall pec stretch 30s 2s  03/17/22: Pt seen for aquatic therapy today.  Treatment took place in water 3.25-4.5 ft in depth at the [place]. Temp of water was 92.  Pt entered/exited the pool via stairs with hand rail.  -Walking forward, backward, and side stepping  - side stepping with shoulder addct with yellow hand floats R/L x 2 laps - unilateral flex/ext (to neutral0-90) with yellow hand floats x 10 - staggered stance with unilateral/ bilat shoulder horiz abdct/add with yellow hand floats - tricep press down with eccentric return to surface x 15 - forward/backward jogging x 6 widths - SLS with thin squoodle leg  press R/L 10 slow, 15 quick. No UE support, 2 sets -  TrA set with thin squoodle push down to thighs, 4 sec eccentric return x 15 - high  knee marching with reciprocal arm swing backwards/forwards  - paloff press, Rider band,(hands on single band), x 10 each side (side step, press out/in, step to neutral) - R/L hamstring stretch with foot on 2nd step, 15s x 2 each LE - L stretch at stairs with knees flexed (modified childs pose) with gentle wag the tail x 2 reps  Pt requires the buoyancy and hydrostatic pressure of water for support, and to offload joints by unweighting joint load by at least 50 % in navel deep water and by at least 75-80% in chest to neck deep water.  Viscosity of the water is needed for resistance of strengthening. Water current perturbations provides challenge to standing balance requiring increased core activation.   Previous: land 02/28/22 Cat/cow 2x10 2s hold Child's pose 5s 10x  Paloff press GTB 2x10  Clamshell 2x15 green  Supine bridge 2x15   Leg press 20 pounds 10x lifestyles Row machine 10x 20 pounds Cable extension 10x 10 pounds Cable row 10x 15 pounds  Gym recovery, exercise set up, strengthening technique Patient educated on proper set up of all machines  PATIENT EDUCATION:  Education details: aquatic progressions/ modifications  Person educated: Patient Education method: Explanation Education comprehension: verbalized understanding   HOME EXERCISE PROGRAM:  Access Code: [email or web address] URL: [email or web address] Date: 12/23/2021 Prepared  by: Daleen Bo  ASSESSMENT:  CLINICAL IMPRESSION: Pt arrives pain-free today.  Discussed plans for transitioning to independent HEP; pt plans to talk to membership about joining [place].  Trialed different equipment (that members have access to) with familiar exercises from past sessions. Only minor cues needed today for posture. Completed all exercises without production of pain.  Discussed need for continued compliance to maintain gains made in PT thus far. Will issue laminated aquatic HEP at next land visit.  Pt to have land reassessment  on 1/29 to determine further need for therapy. Pt will continue to benefit from skilled PT intervention to maximize mobility with less pain.   OBJECTIVE IMPAIRMENTS cardiopulmonary status limiting activity, difficulty walking, decreased ROM, decreased strength, impaired flexibility, impaired sensation, and pain.   ACTIVITY LIMITATIONS carrying, lifting, bending, sitting, standing, squatting, stairs, and reach over head  PARTICIPATION LIMITATIONS: meal prep, cleaning, laundry, shopping, community activity, occupation, and yard work  PERSONAL FACTORS HTN, aortic aneurism  are also affecting patient's functional outcome.   REHAB POTENTIAL: Good  CLINICAL DECISION MAKING: Unstable/unpredictable  EVALUATION COMPLEXITY: High   GOALS: Goals reviewed with patient? Yes  SHORT TERM GOALS: Target date: 12/23/21  Pt reports compliance with HEP Baseline: pt reports inconsistent compliance  Goal status: Partially met -01/18/22  2.  Left shoulder strength to be tested using dynamometer '@land'$  based appointment Baseline: MMT completed Goal status: achived   3.  Pt will have decreased frequency of P&N/paresthesias into left foot. 5/7days Baseline: 0/7 days for 3 wks Goal status: Achieved -improved pain and foot achieved 12 /4  4.  Pt will report decrease in pain sx after aquatic session lasting at least a few hours. Baseline:  Goal status: Achieved -improved pain with aquatic therapy 12/4   LONG TERM GOALS: Target date: 01/20/22  Pt to meet FOTO goal of 55 Baseline: 47 Goal status: 49 and 55  2.  Pt to be indep with final aquatic/land based HEP Baseline:  Goal status: Progressing towards final HEP 12/4  3.  Pt will be able to complete standing ADL's without pain Baseline: 4/10 pain with ironing and grocery shopping  Goal status: Ongoing -   4.  Max pain to decrease to 5/10 or< to demonstrate improved management of and controlled pain Baseline: 6/10 max  Goal status:Ongoing -  01/18/22    PLAN: PT FREQUENCY: 1-2x/week  PT DURATION: 8 weeks  PLANNED INTERVENTIONS: Therapeutic exercises, Therapeutic activity, Neuromuscular re-education, Balance training, Gait training, Patient/Family education, Self Care, Joint mobilization, Joint manipulation, Stair training, Aquatic Therapy, Dry Needling, Electrical stimulation, Spinal manipulation, Spinal mobilization, Cryotherapy, Moist heat, Taping, Manual therapy, and Re-evaluation.  PLAN FOR NEXT SESSION: plan for land reassessment 1/29 for potential D/C depending on exam findings  Kerin Perna, PTA 03/23/22 6:02 PM [facility] [address] [place], [place], [address] Phone: [phone]   Fax:  [phone]

## 2022-03-27 ENCOUNTER — Ambulatory Visit (HOSPITAL_BASED_OUTPATIENT_CLINIC_OR_DEPARTMENT_OTHER): Payer: BC Managed Care – PPO | Admitting: Physical Therapy

## 2022-03-27 ENCOUNTER — Encounter (HOSPITAL_BASED_OUTPATIENT_CLINIC_OR_DEPARTMENT_OTHER): Payer: Self-pay | Admitting: Physical Therapy

## 2022-03-27 DIAGNOSIS — M6281 Muscle weakness (generalized): Secondary | ICD-10-CM

## 2022-03-27 DIAGNOSIS — M542 Cervicalgia: Secondary | ICD-10-CM

## 2022-03-27 NOTE — Therapy (Signed)
OUTPATIENT PHYSICAL THERAPY THORACOLUMBAR TREATMENT PHYSICAL THERAPY DISCHARGE SUMMARY  Visits from Start of Care: 18  Plan: Patient agrees to discharge.  Patient goals were not met. Patient is being discharged due to meeting the stated rehab goals.        Patient Name: Gregory Klein MRN: [id number] DOB:1966/01/13, 57 y.o., male Today's Date: 03/27/2022          Past Medical History:  Diagnosis Date   Achilles tendonitis, bilateral 2016   Gregory Klein, DPM   Acute medial meniscus tear of right knee    Acute myocarditis 2015   viral vs d/t strep infection, resolved w/o sequela   Allergy    Ascending aortic aneurysm ([place]) 11/02/2021   Deformity of metatarsal    Bilateral, mild DJD at the right 1st MTP joint and bilateral decrease in calcaneal inclination angle   DVT (deep venous thrombosis) (HCC)    GERD (gastroesophageal reflux disease)    Hemorrhoids    hx of   Hepatitis B infection    hx of dx 2005 aprox   Hyperglycemia 02/22/2015   Hypertension    Neck pain    local injections x 2, las summer 2011 (Dr.Vortek)   Plantar fasciitis, bilateral 2016   Gregory Klein, DPM   Sleep apnea    on CPAP    Past Surgical History:  Procedure Laterality Date   INGUINAL HERNIA REPAIR     KNEE ARTHROSCOPY  6-15   Dr Mardelle Matte   KNEE ARTHROSCOPY WITH MEDIAL MENISECTOMY Right 09/26/2018   LEFT HEART CATHETERIZATION WITH CORONARY ANGIOGRAM N/A 12/29/2013   Procedure: LEFT HEART CATHETERIZATION WITH CORONARY ANGIOGRAM;  Surgeon: [name], MD;  Location: [facility] CATH LAB;  Service: Cardiovascular;  Laterality: N/A;   Patient Active Problem List   Diagnosis Date Noted   Ascending aortic aneurysm ([place]) 11/02/2021   Hyperglycemia 02/22/2015   PCP NOTES >>> 12/28/2014   OSA on CPAP 07/21/2014   Myocarditis ([place])    Hypertension    GERD (gastroesophageal reflux disease)    Hepatitis B infection    Neck pain--Chronic 11/10/2013   Annual physical exam 03/29/2011    PCP: Colon Branch, MD  REFERRING PROVIDER: [name], MD  REFERRING DIAG:  I10 (ICD-10-CM) - Primary hypertension  I71.21 (ICD-10-CM) - Aneurysm of ascending aorta without rupture [facility])    Rationale for Evaluation and Treatment Rehabilitation  THERAPY DIAG:  No diagnosis found.  ONSET DATE: 2 yrs  SUBJECTIVE:  SUBJECTIVE STATEMENT: Pt states the back is better but the neck still tenses up with work related things. Pt is now a member of the facility.   PERTINENT HISTORY:  58 y.o. male with a hx of acute myocarditis, DVT, hypertension, prediabetes, GERD, and sleep apnea on CPAP,  enlarged aorta .  Low back with sciatica pain and cervical neck pain.  Cervical stenosis;  KNEE ARTHROSCOPY WITH MEDIAL MENISECTOMY Right 09/26/2018     PAIN:  Are you having pain? No  : NPRS scale: 0/10 Pain location: Pain description:  Aggravating factors: Sitting for periods of time Relieving factors: lying down/sleeping   PRECAUTIONS: Cervical  WEIGHT BEARING RESTRICTIONS No  FALLS:  Has patient fallen in last 6 months? 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 Mild progression of central disc protrusion C3-4. Mild spinal stenosis and mild foraminal stenosis bilaterally similar to the prior study 2. Moderate spinal stenosis with moderate foraminal stenosis bilaterally at C4-5 and C5-6 unchanged 3. Mild foraminal narrowing bilaterally C6-7 unchanged. 03/29/21 Lumbar spine MRI: Mild degenerative changes as detailed above  PATIENT SURVEYS:   FOTO 47 with goal of 55  FOTO 1/29  64%   COGNITION:  Overall cognitive status: Within functional limits for tasks assessed     UE ROM/STRENGTH ROM: Left shoulder WNL  STR: Left shoulder 4/5        LOWER EXTREMITY ROM:      Active  Right eval R Left eval L  Hip flexion 80 110 70 in supine 110  Hip extension 10 10 5d 10  Hip abduction        Hip adduction        Hip internal rotation wfl  wfl   Hip external rotation Limited 25% 45 Limited 50% 45  Knee flexion        Knee extension        Ankle dorsiflexion        Ankle plantarflexion        Ankle inversion        Ankle eversion         (Blank rows = WFL)   LOWER EXTREMITY MMT:     MMT Right eval Left eval Right  12/4 R  1/29 Left 12/4 L  1/29  Hip flexion 4 4 5/ 38.3 5/5 4+/ 31.4 5/5  Hip extension 4- 4-      Hip abduction 4 4 5/48.3 5/5 5/49.7 5/5  Hip adduction 5 5  5/5  5/5  Hip internal rotation wfl wfl  5/5  5/5  Hip external rotation wfl Limited no P!      Knee flexion 5 5      Knee extension 5 5 53.4  62.8   Ankle dorsiflexion          Ankle plantarflexion          Ankle inversion          Ankle eversion           (Blank rows = WFL)   Flexion 65 Cervical rotation L 70 Right 70   TODAY'S TREATMENT   1/29 Transition to personal training, exercise progression expectations, daily postural mods, continuation with aquatic and land based self rehab  Exercises - Gentle Levator Scapulae Stretch  -  2 x daily - 7 x weekly - 1 sets - 3 reps - 30 hold - Seated Cervical Retraction  - 1 x daily - 7 x weekly - 1 sets - 15 reps - 2 hold - Child's Pose Stretch  - 1 x daily - 7 x weekly - 1 sets - 10 reps - 5 hold - Cat Cow  - 1 x daily - 7 x weekly - 3 sets - 10 reps - 5 hold - Supine Lower Trunk Rotation  - 1 x daily - 7 x weekly - 1 sets - 10 reps - 2 hold - Standing Anti-Rotation Press with Anchored Resistance  - 1 x daily - 2 x weekly - 2 sets - 10 reps - Shoulder External Rotation and Scapular  Retraction with Resistance  - 1 x daily - 2 x weekly - 2 sets - 10 reps - Standing Shoulder Diagonal Horizontal Abduction 60/120 Degrees with Resistance  - 1 x daily - 2 x weekly - 1 sets - 5 reps  03/23/22: Pt seen for aquatic therapy today.  Treatment took place in water 3.25-4.5 ft in depth at the [facility]. Temp of water was 92.  Pt entered/exited the pool via stairs with hand rail.  -Walking forward, backward for warm up - seated piriformis stretch on stairs;  standing hamstring stretch with foot on 2nd step x 45-60sec x 2 each LE -TrA set with short blue noodle -> kick board -> rainbow hand floats   - side stepping with shoulder addct with rainbow -> yellow hand floats R/L x 3 laps - staggered stance with unilateral/ bilat shoulder horiz abdct/add with yellow hand floats - tricep press down with blue hand floats, eccentric return to surface x 10, yellow x 10  - single yellow hand float at side under water with forward/ backward walk- 4 laps total - light jog forward  - staggered stance with kick board row x 10 each LE forward followed by gentle trunk rotation for stretch - hip hinge with forward arm reach with hands on kick board x 10  - SLS with thin squoodle leg  press R/L 10 slow, 15 quick. With/ without UE support, 2 sets - L stretch at stairs with knees flexed (modified childs pose) with gentle wag the tail x 2 reps  Pt requires the buoyancy and hydrostatic pressure of water for support, and to offload joints by unweighting joint load by at least 50 % in navel deep water and by at least 75-80% in chest to neck deep water.  Viscosity of the water is needed for resistance of strengthening. Water current perturbations provides challenge to standing balance requiring increased core activation. 1/22  L UT STM  Chin nod 3s 15x Seated chin tuck 2s 10x Levator stretch bilat 30s 2x each Seated RTB ER 3x10 Wall pec stretch 30s 2s  03/17/22: Pt seen for aquatic therapy  today.  Treatment took place in water 3.25-4.5 ft in depth at the [facility]. Temp of water was 92.  Pt entered/exited the pool via stairs with hand rail.  -Walking forward, backward, and side stepping  - side stepping with shoulder addct with yellow hand floats R/L x 2 laps - unilateral flex/ext (to neutral0-90) with yellow hand floats x 10 - staggered stance with unilateral/ bilat shoulder horiz abdct/add with yellow hand floats - tricep press down with eccentric return to surface x 15 - forward/backward jogging x 6 widths - SLS with thin squoodle leg  press  R/L 10 slow, 15 quick. No UE support, 2 sets -  TrA set with thin squoodle push down to thighs, 4 sec eccentric return x 15 - high knee marching with reciprocal arm swing backwards/forwards  - paloff press, Rider band,(hands on single band), x 10 each side (side step, press out/in, step to neutral) - R/L hamstring stretch with foot on 2nd step, 15s x 2 each LE - L stretch at stairs with knees flexed (modified childs pose) with gentle wag the tail x 2 reps  Pt requires the buoyancy and hydrostatic pressure of water for support, and to offload joints by unweighting joint load by at least 50 % in navel deep water and by at least 75-80% in chest to neck deep water.  Viscosity of the water is needed for resistance of strengthening. Water current perturbations provides challenge to standing balance requiring increased core activation.   Previous: land 02/28/22 Cat/cow 2x10 2s hold Child's pose 5s 10x  Paloff press GTB 2x10  Clamshell 2x15 green  Supine bridge 2x15   Leg press 20 pounds 10x lifestyles Row machine 10x 20 pounds Cable extension 10x 10 pounds Cable row 10x 15 pounds  Gym recovery, exercise set up, strengthening technique Patient educated on proper set up of all machines  PATIENT EDUCATION:  Education details: exam findings, anatomy, exercise progression, DOMS expectations, D/C episode,  envelope of  function, HEP, POC   Person educated: Patient Education method: Explanation Education comprehension: verbalized understanding   HOME EXERCISE PROGRAM:  Access Code: [email or web address] URL: [email or web address] Date: 12/23/2021 Prepared by: Daleen Bo  ASSESSMENT:  CLINICAL IMPRESSION: Pt has met all functional goals with therapy at this time and has good understanding of self progression of exercise and transition to personal trianing. Pt is now a member of [facility] and will be referred to personal training program for integrated transition to independent exercise and pain management. Pt has exceeded FOTO outcome measure expectations at this time and is independent with exercise. Pt's LBP has greatly improved and will continue with C/S and postural progression.   OBJECTIVE IMPAIRMENTS cardiopulmonary status limiting activity, difficulty walking, decreased ROM, decreased strength, impaired flexibility, impaired sensation, and pain.   ACTIVITY LIMITATIONS carrying, lifting, bending, sitting, standing, squatting, stairs, and reach over head  PARTICIPATION LIMITATIONS: meal prep, cleaning, laundry, shopping, community activity, occupation, and yard work  PERSONAL FACTORS HTN, aortic aneurism  are also affecting patient's functional outcome.   REHAB POTENTIAL: Good  CLINICAL DECISION MAKING: Unstable/unpredictable  EVALUATION COMPLEXITY: High   GOALS: Goals reviewed with patient? Yes  SHORT TERM GOALS: Target date: 12/23/21  Pt reports compliance with HEP Baseline: pt reports inconsistent compliance  Goal status: Partially met -01/18/22  2.  Left shoulder strength to be tested using dynamometer '@land'$  based appointment Baseline: MMT completed Goal status: achived   3.  Pt will have decreased frequency of P&N/paresthesias into left foot. 5/7days Baseline: 0/7 days for 3 wks Goal status: Achieved -improved pain and foot achieved 12 /4  4.  Pt will report decrease in  pain sx after aquatic session lasting at least a few hours. Baseline:  Goal status: Achieved -improved pain with aquatic therapy 12/4   LONG TERM GOALS: Target date: 01/20/22  Pt to meet FOTO goal of 55 Baseline: 47 Goal status: MET  2.  Pt to be indep with final aquatic/land based HEP Baseline:  Goal status: MET  3.  Pt will be able to complete standing ADL's without pain Baseline: 4/10 pain with ironing and grocery shopping  Goal status: MET  4.  Max pain to decrease to 5/10 or< to demonstrate improved management of and controlled pain Baseline: 6/10 max  Goal status:Ongoing -MET    PLAN: PT FREQUENCY: 1-2x/week  PT DURATION: 8 weeks  PLANNED INTERVENTIONS: Therapeutic exercises, Therapeutic activity, Neuromuscular re-education, Balance training, Gait training, Patient/Family education, Self Care, Joint mobilization, Joint manipulation, Stair training, Aquatic Therapy, Dry Needling, Electrical stimulation, Spinal manipulation, Spinal mobilization, Cryotherapy, Moist heat, Taping, Manual therapy, and Re-evaluation.   Daleen Bo PT, DPT 03/27/22 8:06 AM

## 2022-03-30 ENCOUNTER — Ambulatory Visit (HOSPITAL_BASED_OUTPATIENT_CLINIC_OR_DEPARTMENT_OTHER): Payer: BC Managed Care – PPO | Admitting: Physical Therapy

## 2022-04-26 ENCOUNTER — Other Ambulatory Visit: Payer: Self-pay | Admitting: Internal Medicine

## 2022-05-16 ENCOUNTER — Encounter (HOSPITAL_BASED_OUTPATIENT_CLINIC_OR_DEPARTMENT_OTHER): Payer: Self-pay | Admitting: Cardiovascular Disease

## 2022-05-16 ENCOUNTER — Ambulatory Visit (INDEPENDENT_AMBULATORY_CARE_PROVIDER_SITE_OTHER): Payer: BC Managed Care – PPO | Admitting: Cardiovascular Disease

## 2022-05-16 VITALS — BP 118/72 | Ht 71.0 in | Wt 205.2 lb

## 2022-05-16 DIAGNOSIS — G4733 Obstructive sleep apnea (adult) (pediatric): Secondary | ICD-10-CM

## 2022-05-16 DIAGNOSIS — R739 Hyperglycemia, unspecified: Secondary | ICD-10-CM

## 2022-05-16 DIAGNOSIS — I7121 Aneurysm of the ascending aorta, without rupture: Secondary | ICD-10-CM

## 2022-05-16 DIAGNOSIS — I10 Essential (primary) hypertension: Secondary | ICD-10-CM

## 2022-05-16 NOTE — Addendum Note (Signed)
Addended by: Alvina Filbert B on: 05/16/2022 06:31 PM   Modules accepted: Orders

## 2022-05-16 NOTE — Patient Instructions (Addendum)
Medication Instructions:  Your physician recommends that you continue on your current medications as directed. Please refer to the Current Medication list given to you today.   *If you need a refill on your cardiac medications before your next appointment, please call your pharmacy*  Lab Work: LP/CMET/A1C TODAY   FASTING LP/CMET IN 4 MONTHS ABOUT 1 WEEK PRIOR TO FOLLOW UP   If you have labs (blood work) drawn today and your tests are completely normal, you will receive your results only by: [place] (if you have MyChart) OR A paper copy in the mail If you have any lab test that is abnormal or we need to change your treatment, we will call you to review the results.  Testing/Procedures: Your physician has requested that you have an echocardiogram. Echocardiography is a painless test that uses sound waves to create images of your heart. It provides your doctor with information about the size and shape of your heart and how well your heart's chambers and valves are working. This procedure takes approximately one hour. There are no restrictions for this procedure. Please do NOT wear cologne, perfume, aftershave, or lotions (deodorant is allowed). Please arrive 15 minutes prior to your appointment time.  IN 4 MONTHS ABOUT 1 WEEK PRIOR TO FOLLOW UP   Follow-Up: At [facility], you and your health needs are our priority.  As part of our continuing mission to provide you with exceptional heart care, we have created designated Provider Care Teams.  These Care Teams include your primary Cardiologist (physician) and Advanced Practice Providers (APPs -  Physician Assistants and Nurse Practitioners) who all work together to provide you with the care you need, when you need it.  We recommend signing up for the patient portal called "MyChart".  Sign up information is provided on this After Visit Summary.  MyChart is used to connect with patients for Virtual Visits (Telemedicine).  Patients  are able to view lab/test results, encounter notes, upcoming appointments, etc.  Non-urgent messages can be sent to your provider as well.   To learn more about what you can do with MyChart, go to [email or web address].    Your next appointment:   4 month(s)  Provider:   [name], MD or Laurann Montana, NP    Other Instructions  Website for the student white coat event:  [email or web address]

## 2022-05-16 NOTE — Progress Notes (Signed)
Cardiology Office Note:    Date:  05/16/2022   ID:  Doyne Keel, DOB 05-05-1965, MRN [id number]  PCP:  Colon Branch, MD   [facility] Providers Cardiologist:  None     Referring MD: Colon Branch, MD   No chief complaint on file.   History of Present Illness:    Gregory Klein is a 57 y.o. male with a hx of acute myocarditis, DVT, hypertension, prediabetes, GERD, and sleep apnea on CPAP, here for follow-up. He saw his PCP 07/14/2021 and his A1c was 6.1 with dietary changes. His blood pressure was typically in the 140's on losartan 50 mg. His cardiovascular risk was calculated at 11.7%, and he agreed to proceed with elected calcium score for a more objective evaluation of his heart. He had a calcium score 09/20/2021 that revealed a coronary calcium score of 0. There was also aneurysmal dilatation of the ascending thoracic aorta (4.0 cm). Annual follow-up imaging was recommended. After discussion with his PCP, he also noted that his BP had remained in the 140's. He preferred to maintain his regimen until consulting with cardiology. He had an Echo in 2015 with LVEF 50-55% and grade 1 diastolic dysfunction. His aortic root was 4.0 cm at that time.  At his last appointment he reported having hypertension since he was in his 32s. In the prior months his blood pressure has been higher in the 140s. Periodically he checks it with the machine in [facility] and it is usually 140/85, possibly increasing to he 0000000 for diastolic. In clinic it was 154/100 and went down to 144/92 on recheck. He reported lower bac pain and sciatica that radiates to LLE as well as inflammation in his neck from a herniated disc. This pain had been limiting his exercise. His diet has included a lot of ordering out but he has attempted to start meal prepping to address this. He occasionally has 1-2 beers but not often. He reported snoring and sleep apnea, but is not as consistent with his CPAP as he should be. At his visit 10/2021 blood  pressure was uncontrolled and he was started on carvedilol. Follow up with pharm 11/2021, and BP was 130s/80s. They recommended increase losartan but he declined.    Today, he reports that he is doing well over all. His blood pressure today was good in office. He had been taking the carvedilol only in the morning lately, but he will be going back to taking it BID.  He reports that his PCP is concerned he may be developing diabetes and in interested in getting tested today. His PT has helped and he wants to start going to the pool here to help him be more active. He will be trying to add more treadmill work and cardio now that he is feeling better. He has not yet been able to implement his His diet is still not what it needs to be. It still includes a lot of take out and large portions. His schedule makes it difficult to cook due to him getting home around 8 or later at night. He acknowledges that he has started drinking beer again and is still struggling with drinking soda.   He denies any palpitations, chest pain, shortness of breath, or peripheral edema. No lightheadedness, headaches, syncope, orthopnea, or PND.    Past Medical History:  Diagnosis Date   Achilles tendonitis, bilateral 2016   [name], DPM   Acute medial meniscus tear of right knee    Acute  myocarditis 2015   viral vs d/t strep infection, resolved w/o sequela   Allergy    Ascending aortic aneurysm ([place]) 11/02/2021   Deformity of metatarsal    Bilateral, mild DJD at the right 1st MTP joint and bilateral decrease in calcaneal inclination angle   DVT (deep venous thrombosis) (HCC)    GERD (gastroesophageal reflux disease)    Hemorrhoids    hx of   Hepatitis B infection    hx of dx 2005 aprox   Hyperglycemia 02/22/2015   Hypertension    Neck pain    local injections x 2, las summer 2011 (Dr.Vortek)   Plantar fasciitis, bilateral 2016   [name], DPM   Sleep apnea    on CPAP     Past Surgical History:   Procedure Laterality Date   INGUINAL HERNIA REPAIR     KNEE ARTHROSCOPY  6-15   Dr Mardelle Matte   KNEE ARTHROSCOPY WITH MEDIAL MENISECTOMY Right 09/26/2018   LEFT HEART CATHETERIZATION WITH CORONARY ANGIOGRAM N/A 12/29/2013   Procedure: LEFT HEART CATHETERIZATION WITH CORONARY ANGIOGRAM;  Surgeon: [name], MD;  Location: [facility] CATH LAB;  Service: Cardiovascular;  Laterality: N/A;    Current Medications: Current Meds  Medication Sig   aspirin EC 81 MG tablet Take 81 mg by mouth daily. Swallow whole.   carvedilol (COREG) 12.5 MG tablet Take 1 tablet (12.5 mg total) by mouth 2 (two) times daily.   cetirizine (ZYRTEC) 10 MG chewable tablet Chew 10 mg by mouth daily.   Cholecalciferol (VITAMIN D) 50 MCG (2000 UT) tablet Take 2,000 Units by mouth daily.   losartan (COZAAR) 50 MG tablet TAKE 1 TABLET(50 MG) BY MOUTH DAILY   OVER THE COUNTER MEDICATION Place 1-2 drops into both eyes as needed (for dry eyes). "[place] for Dry Eyes"   polyethylene glycol (MIRALAX / GLYCOLAX) packet Take 17 g by mouth daily.   Probiotic Product (PROBIOTIC DAILY PO) Take by mouth.   psyllium (METAMUCIL) 58.6 % powder Take 1 packet by mouth as needed (for constipation). Reported on 05/07/2015   valACYclovir (VALTREX) 1000 MG tablet TAKE 2 TABLETS BY MOUTH TWICE DAILY AS NEEDED FOR OUTBREAK     Allergies:   Amoxicillin and Vancomycin   Social History   Socioeconomic History   Marital status: Single    Spouse name: Not on file   Number of children: 0   Years of education: Not on file   Highest education level: Not on file  Occupational History   Occupation: Principal    Employer: [employer]  Tobacco Use   Smoking status: Never   Smokeless tobacco: Never  Vaping Use   Vaping Use: Never used  Substance and Sexual Activity   Alcohol use: No    Alcohol/week: 0.0 standard drinks of alcohol   Drug use: No   Sexual activity: Not on file  Other Topics Concern   Not on file  Social History  Narrative    Lives by himself   Social Determinants of Health   Financial Resource Strain: Not on file  Food Insecurity: Not on file  Transportation Needs: Not on file  Physical Activity: Not on file  Stress: Not on file  Social Connections: Not on file     Family History: The patient's family history includes Breast cancer in his paternal grandmother; Coronary artery disease in his maternal grandmother; Diabetes in his paternal grandfather; Heart attack in his maternal grandmother; Hypertension in his father, maternal grandmother, and mother; Kidney Stones  in his father; Pancreatic cancer in his paternal aunt; Prostate cancer (age of onset: 41) in his father; Prostate cancer (age of onset: 56) in his maternal grandfather; Stroke in his mother. There is no history of Colon cancer, Esophageal cancer, Kidney disease, Liver cancer, Rectal cancer, or Stomach cancer.  ROS:   Please see the history of present illness.    All other systems reviewed and are negative.  EKGs/Labs/Other Studies Reviewed:    The following studies were reviewed today:  Coronary CT  09/20/2021: IMPRESSION: Coronary calcium score of 0 Agatston units. This suggests low risk for future cardiac events.  OVER-READ INTERPRETATION  CT CHEST FINDINGS: Aneurysmal dilatation ascending thoracic aorta 4.0 cm transverse image 10. Heart size upper normal. No pericardial effusion.   Esophagus unremarkable. Visualized upper abdomen unremarkable. No thoracic adenopathy.   5 mm focus of nodular thickening at RIGHT major fissure image 12. 5 mm focus of nodular pleural thickening LEFT major fissure image 14. Minimal subsegmental atelectasis LEFT lower lobe. Remaining lungs clear. No acute osseous findings.   IMPRESSION: Aneurysmal dilatation ascending thoracic aorta 4.0 cm transverse; Recommend annual imaging followup by CTA or MRA. This recommendation follows 2010 ACCF/AHA/AATS/ACR/ASA/SCA/SCAI/SIR/STS/SVM Guidelines for  the Diagnosis and Management of Patients with Thoracic Aortic Disease. Circulation. 2010; [id number]. Aortic aneurysm NOS (ICD10-I71.9)   Nodular foci of pleural thickening at the major fissures bilaterally; no routine follow-up recommended.   EKG:  EKG is personally reviewed. 05/16/2022: EKG was not ordered.  11/02/2021: Sinus rhythm. Rate 72 bpm. LVH.  Recent Labs: 12/02/2021: ALT 15; BUN 18; Creatinine, Ser 1.15; Hemoglobin 13.4; Platelets 218; Potassium 4.9; Sodium 144 01/10/2022: TSH 1.18   Recent Lipid Panel    Component Value Date/Time   CHOL 196 01/10/2022 1100   CHOL 175 04/23/2019 0919   TRIG 47.0 01/10/2022 1100   HDL 45.50 01/10/2022 1100   HDL 56 04/23/2019 0919   CHOLHDL 4 01/10/2022 1100   VLDL 9.4 01/10/2022 1100   LDLCALC 141 (H) 01/10/2022 1100   LDLCALC 112 (H) 04/23/2019 0919   LDLDIRECT 135.8 03/29/2011 1043     Risk Assessment/Calculations:           Physical Exam:    Wt Readings from Last 3 Encounters:  05/16/22 205 lb 3.2 oz (93.1 kg)  01/10/22 196 lb 8 oz (89.1 kg)  12/02/21 201 lb 12.8 oz (91.5 kg)     VS:  BP 118/72   Ht 5\' 11"  (1.803 m)   Wt 205 lb 3.2 oz (93.1 kg)   BMI 28.62 kg/m  , BMI Body mass index is 28.62 kg/m. GENERAL:  Well appearing HEENT: Pupils equal round and reactive, fundi not visualized, oral mucosa unremarkable NECK:  No jugular venous distention, waveform within normal limits, carotid upstroke brisk and symmetric, no bruits, no thyromegaly LUNGS:  Clear to auscultation bilaterally HEART:  RRR.  PMI not displaced or sustained,S1 and S2 within normal limits, no S3, no S4, no clicks, no rubs, no murmurs ABD:  Flat, positive bowel sounds normal in frequency in pitch, no bruits, no rebound, no guarding, no midline pulsatile mass, no hepatomegaly, no splenomegaly EXT:  2 plus pulses throughout, no edema, no cyanosis no clubbing SKIN:  No rashes no nodules NEURO:  Cranial nerves II through XII grossly intact, motor  grossly intact throughout PSYCH:  Cognitively intact, oriented to person place and time   ASSESSMENT:    1. Primary hypertension   2. Aneurysm of ascending aorta without rupture ([place])   3. OSA  on CPAP   4. Hyperglycemia     PLAN:    No problem-specific Assessment & Plan notes found for this encounter.  # Pre-diabetes: The patient's last A1C was 6.1 in October, expressing concern over his current A1C level and family history of diabetes.  Plan: - Check A1C today and schedule a retest in four months. - Advise the patient on dietary improvements and increased physical activity, emphasizing portion control, meal preparation, and reducing intake of high-calorie beverages. - Arrange a follow-up visit to evaluate progress.  # Obesity: The patient weighs 205 lbs, marking his highest recorded weight, and faces challenges with portion control and making healthy food choices.  Plan: - Recommend regular physical activity, including treadmill workouts and therapy pool sessions. - Suggest meal preparation and portion control as dietary strategies. - Reevaluate the patient's weight and dietary progress in four months.  # Hypertension: The patient is on Losartan and Carvedilol, with blood pressure readings indicating good control.  # Hyperlipidemia: The patient's LDL cholesterol level was 141 in November, and he is not taking statin medication.  He had no coronary calcium on calcium score in 2023.   Plan: - Perform lipid profile testing today and repeat in four months. - Encourage lifestyle modifications focusing on diet and exercise to lower LDL cholesterol. - Assess the necessity for statin therapy at the next visit.  # Ascending aorta aneurysm: 4.0 cm on CT 08/2021.  BP well-controlled.  Echo 08/2021.  Continue carvedilol.  Disposition: FU with Donavyn Fecher C. Oval Linsey, MD, [facility] in 4 months after echocardiogram and lab work.   Medication Adjustments/Labs and Tests Ordered: Current  medicines are reviewed at length with the patient today.  Concerns regarding medicines are outlined above.   Orders Placed This Encounter  Procedures   Lipid panel   Comprehensive metabolic panel   HgB [id number]   ECHOCARDIOGRAM COMPLETE   No orders of the defined types were placed in this encounter.  Patient Instructions  Medication Instructions:  Your physician recommends that you continue on your current medications as directed. Please refer to the Current Medication list given to you today.   *If you need a refill on your cardiac medications before your next appointment, please call your pharmacy*  Lab Work: LP/CMET/A1C TODAY   FASTING LP/CMET IN 4 MONTHS ABOUT 1 WEEK PRIOR TO FOLLOW UP   If you have labs (blood work) drawn today and your tests are completely normal, you will receive your results only by: [place] (if you have MyChart) OR A paper copy in the mail If you have any lab test that is abnormal or we need to change your treatment, we will call you to review the results.  Testing/Procedures: Your physician has requested that you have an echocardiogram. Echocardiography is a painless test that uses sound waves to create images of your heart. It provides your doctor with information about the size and shape of your heart and how well your heart's chambers and valves are working. This procedure takes approximately one hour. There are no restrictions for this procedure. Please do NOT wear cologne, perfume, aftershave, or lotions (deodorant is allowed). Please arrive 15 minutes prior to your appointment time.  IN 4 MONTHS ABOUT 1 WEEK PRIOR TO FOLLOW UP   Follow-Up: At [facility], you and your health needs are our priority.  As part of our continuing mission to provide you with exceptional heart care, we have created designated Provider Care Teams.  These Care Teams include your primary Cardiologist (  physician) and Advanced Practice Providers (APPs -  Physician  Assistants and Nurse Practitioners) who all work together to provide you with the care you need, when you need it.  We recommend signing up for the patient portal called "MyChart".  Sign up information is provided on this After Visit Summary.  MyChart is used to connect with patients for Virtual Visits (Telemedicine).  Patients are able to view lab/test results, encounter notes, upcoming appointments, etc.  Non-urgent messages can be sent to your provider as well.   To learn more about what you can do with MyChart, go to [email or web address].    Your next appointment:   4 month(s)  Provider:   [name], MD or Laurann Montana, NP    Other Instructions  Website for the student white coat event:  [email or web address]  I,Jessica Ford,acting as a scribe for [name], MD.,have documented all relevant documentation on the behalf of [name], MD,as directed by  [name], MD while in the presence of [name], MD.   I, Johnston Oval Linsey, MD have reviewed all documentation for this visit.  The documentation of the exam, diagnosis, procedures, and orders on 05/16/2022 are all accurate and complete.    Signed, [name], MD  05/16/2022 8:50 AM    [place]

## 2022-05-17 LAB — LIPID PANEL
Chol/HDL Ratio: 3.7 ratio (ref 0.0–5.0)
Cholesterol, Total: 198 mg/dL (ref 100–199)
HDL: 53 mg/dL (ref >39)
LDL Chol Calc (NIH): 135 mg/dL — ABNORMAL HIGH (ref 0–99)
Triglycerides: 52 mg/dL (ref 0–149)
VLDL Cholesterol Cal: 10 mg/dL (ref 5–40)

## 2022-05-17 LAB — COMPREHENSIVE METABOLIC PANEL
ALT: 22 IU/L (ref 0–44)
AST: 19 IU/L (ref 0–40)
Albumin/Globulin Ratio: 1.4 (ref 1.2–2.2)
Albumin: 4.1 g/dL (ref 3.8–4.9)
Alkaline Phosphatase: 60 IU/L (ref 44–121)
BUN/Creatinine Ratio: 16 (ref 9–20)
BUN: 19 mg/dL (ref 6–24)
Bilirubin Total: 0.5 mg/dL (ref 0.0–1.2)
CO2: 24 mmol/L (ref 20–29)
Calcium: 8.7 mg/dL (ref 8.7–10.2)
Chloride: 101 mmol/L (ref 96–106)
Creatinine, Ser: 1.21 mg/dL (ref 0.76–1.27)
Globulin, Total: 2.9 g/dL (ref 1.5–4.5)
Glucose: 109 mg/dL — ABNORMAL HIGH (ref 70–99)
Potassium: 4.6 mmol/L (ref 3.5–5.2)
Sodium: 138 mmol/L (ref 134–144)
Total Protein: 7 g/dL (ref 6.0–8.5)
eGFR: 71 mL/min/{1.73_m2} (ref >59)

## 2022-05-17 LAB — HEMOGLOBIN A1C
Est. average glucose Bld gHb Est-mCnc: 131 mg/dL
Hgb A1c MFr Bld: 6.2 % — ABNORMAL HIGH (ref 4.8–5.6)

## 2022-06-08 NOTE — Progress Notes (Signed)
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

## 2022-06-15 ENCOUNTER — Encounter: Payer: Self-pay | Admitting: *Deleted

## 2022-06-15 NOTE — Progress Notes (Signed)
Pt seen at 06/08/22 screening event where screening b/p was 16109. Pt did not identify any SDOH insecurities at the event and RN notes at the event stated "No screening needed. 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 Chart review indicates pt has PCP, Dr. Drue Novel, with whom he has ongoing care, and has a future appt. 5/24. No additional health equity team support indicated at this time.

## 2022-07-13 ENCOUNTER — Encounter: Payer: Self-pay | Admitting: Gastroenterology

## 2022-07-17 ENCOUNTER — Ambulatory Visit: Payer: BC Managed Care – PPO | Admitting: Internal Medicine

## 2022-07-17 ENCOUNTER — Encounter: Payer: Self-pay | Admitting: Internal Medicine

## 2022-07-17 VITALS — BP 126/68 | HR 68 | Temp 98.0°F | Resp 18 | Ht 71.0 in | Wt 203.1 lb

## 2022-07-17 DIAGNOSIS — R739 Hyperglycemia, unspecified: Secondary | ICD-10-CM

## 2022-07-17 DIAGNOSIS — I10 Essential (primary) hypertension: Secondary | ICD-10-CM | POA: Diagnosis not present

## 2022-07-17 DIAGNOSIS — G4733 Obstructive sleep apnea (adult) (pediatric): Secondary | ICD-10-CM

## 2022-07-17 NOTE — Assessment & Plan Note (Signed)
Hyperglycemia: Last A1c 6.2.  Encouraged healthy diet HTN: Last BMP satisfactory, currently on Losartan, carvedilol, no recent ambulatory BPs.  BP today is very good.  Encouraged to check ambulatory BPs and continue same medicines. OSA: Patient educated regarding the need to use a CPAP every night, benefits discussed including better BP, decrease CV risk, increased energy. [place]: Has a lump at the right side of the chest, since benign, observation. Preventive care: Recommend to think about a COVID booster.  Due for colonoscopy, encouraged to contact GI. RTC 12-2022 CPX

## 2022-07-17 NOTE — Progress Notes (Signed)
Subjective:    Patient ID: Gregory Klein, male    DOB: 07/15/1965, 57 y.o.   MRN: [id number]  DOS:  07/17/2022 Type of visit - description: rov  Since the last office visit is doing okay. No ambulatory BPs. No consistent use of CPAP. Still has a bump at the right chest, no change in size, no pain or discharge but would like it to be checked.   BP Readings from Last 3 Encounters:  07/17/22 126/68  06/08/22 (!) 140/79  05/16/22 118/72    Review of Systems See above   Past Medical History:  Diagnosis Date   Achilles tendonitis, bilateral 2016   [name], DPM   Acute medial meniscus tear of right knee    Acute myocarditis 2015   viral vs d/t strep infection, resolved w/o sequela   Allergy    Ascending aortic aneurysm (HCC) 11/02/2021   Deformity of metatarsal    Bilateral, mild DJD at the right 1st MTP joint and bilateral decrease in calcaneal inclination angle   DVT (deep venous thrombosis) (HCC)    GERD (gastroesophageal reflux disease)    Hemorrhoids    hx of   Hepatitis B infection    hx of dx 2005 aprox   Hyperglycemia 02/22/2015   Hypertension    Neck pain    local injections x 2, las summer 2011 (Dr.Vortek)   Plantar fasciitis, bilateral 2016   [name], DPM   Sleep apnea    on CPAP     Past Surgical History:  Procedure Laterality Date   INGUINAL HERNIA REPAIR     KNEE ARTHROSCOPY  6-15   Dr Dion Saucier   KNEE ARTHROSCOPY WITH MEDIAL MENISECTOMY Right 09/26/2018   LEFT HEART CATHETERIZATION WITH CORONARY ANGIOGRAM N/A 12/29/2013   Procedure: LEFT HEART CATHETERIZATION WITH CORONARY ANGIOGRAM;  Surgeon: Wendall Stade, MD;  Location: [facility] CATH LAB;  Service: Cardiovascular;  Laterality: N/A;    Current Outpatient Medications  Medication Instructions   aspirin EC 81 mg, Oral, Daily, Swallow whole.    carvedilol (COREG) 12.5 mg, Oral, 2 times daily   cetirizine (ZYRTEC) 10 mg, Oral, Daily   losartan (COZAAR) 50 MG tablet TAKE 1 TABLET(50 MG) BY MOUTH  DAILY   OVER THE COUNTER MEDICATION 1-2 drops, Both Eyes, As needed, "[employer] for Dry Eyes"    polyethylene glycol (MIRALAX / GLYCOLAX) 17 g, Oral, Daily   Probiotic Product (PROBIOTIC DAILY PO) Oral   psyllium (METAMUCIL) 58.6 % powder 1 packet, Oral, As needed, Reported on 05/07/2015   valACYclovir (VALTREX) 1000 MG tablet TAKE 2 TABLETS BY MOUTH TWICE DAILY AS NEEDED FOR OUTBREAK   Vitamin D 2,000 Units, Oral, Daily       Objective:   Physical Exam Chest:      BP 126/68   Pulse 68   Temp 98 F (36.7 C) (Oral)   Resp 18   Ht 5\' 11"  (1.803 m)   Wt 203 lb 2 oz (92.1 kg)   SpO2 98%   BMI 28.33 kg/m  General:   Well developed, NAD, BMI noted. HEENT:  Normocephalic . Face symmetric, atraumatic Lungs:  CTA B Normal respiratory effort, no intercostal retractions, no accessory muscle use. Heart: RRR,  no murmur.  Lower extremities: no pretibial edema bilaterally  Skin: Not pale. Not jaundice Neurologic:  alert & oriented X3.  Speech normal, gait appropriate for age and unassisted Psych--  Cognition and judgment appear intact.  Cooperative with normal attention span and concentration.  Behavior appropriate. No anxious or depressed appearing.      Assessment     ASSESSMENT Prediabetes A1C 5.9 (12-2014) HTN OSA on CPAP Vitamin D deficiency --DVT: 09/2018: R leg DVT 3 weeks after R knee arthoscopy. --Post thrombotic syndrome: had R leg edema ~ 09/2019 after a trip, [place]: chronic thrombus R popliteal, rec ASA and compression  stockings x life (see vascular note 10-10-2019) June 2023: Calcium coronary score: 0.  + Ascending aorta dilatation (4.0 cm).  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 Lanny Hurst), occ numbness R thumb,  Injection 01/2019 H/o Urolithiasis ER  04-30-25   PLAN Hyperglycemia: Last A1c 6.2.  Encouraged healthy diet HTN: Last BMP satisfactory, currently on Losartan, carvedilol, no recent ambulatory BPs.  BP today is very good.  Encouraged to check ambulatory BPs and continue same medicines. OSA: Patient educated regarding the need to use a CPAP every night, benefits discussed including better BP, decrease CV risk, increased energy. [place]: Has a lump at the right side of the chest, since benign, observation. Preventive care: Recommend to think about a COVID booster.  Due for colonoscopy, encouraged to contact GI. RTC 12-2022 CPX

## 2022-07-17 NOTE — Patient Instructions (Addendum)
Call gastroenterology to set up a colonoscopy [phone]  Vaccines I recommend: Covid booster   Check the  blood pressure regularly BP GOAL is between 110/65 and  135/85. If it is consistently higher or lower, let me know   GO TO THE FRONT DESK, PLEASE SCHEDULE YOUR APPOINTMENTS Come back for  a physical exam by 12-2022

## 2022-08-11 ENCOUNTER — Encounter: Payer: Self-pay | Admitting: Gastroenterology

## 2022-08-29 ENCOUNTER — Telehealth: Payer: Self-pay

## 2022-08-29 NOTE — Telephone Encounter (Signed)
Checking patient chart in preparation for upcoming colonoscopy; last recorded echo was 2015 with EF 50-55%. Patient needs clearance from anesthesia. RN directing clearance request to CRNA.

## 2022-09-04 ENCOUNTER — Other Ambulatory Visit: Payer: Self-pay

## 2022-09-04 ENCOUNTER — Ambulatory Visit (HOSPITAL_COMMUNITY)
Admission: EM | Admit: 2022-09-04 | Discharge: 2022-09-04 | Disposition: A | Discharge status: Home / Self Care | Payer: BC Managed Care – PPO | Attending: Internal Medicine | Admitting: Internal Medicine

## 2022-09-04 ENCOUNTER — Encounter (HOSPITAL_COMMUNITY): Payer: Self-pay

## 2022-09-04 ENCOUNTER — Ambulatory Visit (AMBULATORY_SURGERY_CENTER): Payer: BC Managed Care – PPO

## 2022-09-04 VITALS — Ht 71.0 in | Wt 205.0 lb

## 2022-09-04 DIAGNOSIS — L299 Pruritus, unspecified: Secondary | ICD-10-CM | POA: Insufficient documentation

## 2022-09-04 DIAGNOSIS — Z86010 Personal history of colonic polyps: Secondary | ICD-10-CM

## 2022-09-04 LAB — COMPREHENSIVE METABOLIC PANEL
ALT: 18 U/L (ref 0–44)
AST: 19 U/L (ref 15–41)
Albumin: 3.5 g/dL (ref 3.5–5.0)
Alkaline Phosphatase: 49 U/L (ref 38–126)
Anion gap: 5 (ref 5–15)
BUN: 10 mg/dL (ref 6–20)
CO2: 28 mmol/L (ref 22–32)
Calcium: 8.6 mg/dL — ABNORMAL LOW (ref 8.9–10.3)
Chloride: 102 mmol/L (ref 98–111)
Creatinine, Ser: 1.09 mg/dL (ref 0.61–1.24)
GFR, Estimated: >60 mL/min (ref >60)
Glucose, Bld: 103 mg/dL — ABNORMAL HIGH (ref 70–99)
Potassium: 4.3 mmol/L (ref 3.5–5.1)
Sodium: 135 mmol/L (ref 135–145)
Total Bilirubin: 1 mg/dL (ref 0.3–1.2)
Total Protein: 6.8 g/dL (ref 6.5–8.1)

## 2022-09-04 LAB — CBC
HCT: 41.2 % (ref 39.0–52.0)
Hemoglobin: 13.3 g/dL (ref 13.0–17.0)
MCH: 26.8 pg (ref 26.0–34.0)
MCHC: 32.3 g/dL (ref 30.0–36.0)
MCV: 82.9 fL (ref 80.0–100.0)
Platelets: 236 10*3/uL (ref 150–400)
RBC: 4.97 MIL/uL (ref 4.22–5.81)
RDW: 14.7 % (ref 11.5–15.5)
WBC: 5.1 10*3/uL (ref 4.0–10.5)
nRBC: 0 % (ref 0.0–0.2)

## 2022-09-04 MED ORDER — NA SULFATE-K SULFATE-MG SULF 17.5-3.13-1.6 GM/177ML PO SOLN: 1.0000 | Freq: Once | ORAL | 0 refills | Status: AC

## 2022-09-04 MED ORDER — CYPROHEPTADINE HCL 4 MG PO TABS: 4.0000 mg | ORAL_TABLET | Freq: Three times a day (TID) | ORAL | 0 refills | Status: DC

## 2022-09-04 NOTE — ED Triage Notes (Signed)
Patient here today c/o itching all over X 2-3 weeks. Patient states that he has been sleeping in hotels a lot lately. No rash or bumps. Feels like something is crawling on him.

## 2022-09-04 NOTE — Progress Notes (Signed)
Denies allergies to eggs or soy products. Denies complication of anesthesia or sedation. Denies use of weight loss medication. Denies use of O2.   Emmi instructions given for colonoscopy.  

## 2022-09-04 NOTE — ED Provider Notes (Signed)
[facility]    CSN: [id number] Arrival date & time: 09/04/22  0919      History   Chief Complaint Chief Complaint  Patient presents with   Pruritis    HPI Gregory Klein is a 57 y.o. male who presents with itching all over his body x 2-3 weeks, but does not have a rash. States he has been sleeping in hotels lately due to traveling for work. The only thing new is a hair product, but no other changes in foods, cleaning products have taken place. His skin has not been more dried than usual. He has to tried anything for itching, and seems the itching is worse when he goes to bed, even when he is home. Denies having any other symptoms.     Past Medical History:  Diagnosis Date   Achilles tendonitis, bilateral 2016   [name], DPM   Acute medial meniscus tear of right knee    Acute myocarditis 2015   viral vs d/t strep infection, resolved w/o sequela   Allergy    Ascending aortic aneurysm (HCC) 11/02/2021   Deformity of metatarsal    Bilateral, mild DJD at the right 1st MTP joint and bilateral decrease in calcaneal inclination angle   DVT (deep venous thrombosis) (HCC)    GERD (gastroesophageal reflux disease)    Hemorrhoids    hx of   Hepatitis B infection    hx of dx 2005 aprox   Hyperglycemia 02/22/2015   Hypertension    Neck pain    local injections x 2, las summer 2011 (Dr.Vortek)   Plantar fasciitis, bilateral 2016   [name], DPM   Sleep apnea    on CPAP     Patient Active Problem List   Diagnosis Date Noted   Ascending aortic aneurysm (HCC) 11/02/2021   Hyperglycemia 02/22/2015   PCP NOTES >>> 12/28/2014   OSA on CPAP 07/21/2014   Myocarditis (HCC)    Hypertension    GERD (gastroesophageal reflux disease)    Hepatitis B infection    Neck pain--Chronic 11/10/2013   Annual physical exam 03/29/2011    Past Surgical History:  Procedure Laterality Date   INGUINAL HERNIA REPAIR     KNEE ARTHROSCOPY  6-15   Dr Dion Saucier   KNEE ARTHROSCOPY  WITH MEDIAL MENISECTOMY Right 09/26/2018   LEFT HEART CATHETERIZATION WITH CORONARY ANGIOGRAM N/A 12/29/2013   Procedure: LEFT HEART CATHETERIZATION WITH CORONARY ANGIOGRAM;  Surgeon: Wendall Stade, MD;  Location: [facility] CATH LAB;  Service: Cardiovascular;  Laterality: N/A;       Home Medications    Prior to Admission medications   Medication Sig Start Date End Date Taking? Authorizing Provider  carvedilol (COREG) 12.5 MG tablet Take 1 tablet (12.5 mg total) by mouth 2 (two) times daily. 11/02/21  Yes Chilton Si, MD  cyproheptadine (PERIACTIN) 4 MG tablet Take 1 tablet (4 mg total) by mouth 3 (three) times daily. Prn itching 09/04/22  Yes Rodriguez-Southworth, Nettie Elm, PA-C  losartan (COZAAR) 50 MG tablet TAKE 1 TABLET(50 MG) BY MOUTH DAILY 04/26/22  Yes [name], MD  valACYclovir (VALTREX) 1000 MG tablet TAKE 2 TABLETS BY MOUTH TWICE DAILY AS NEEDED FOR OUTBREAK 12/05/21  Yes [name], MD  aspirin EC 81 MG tablet Take 81 mg by mouth daily. Swallow whole.    [provider]  cetirizine (ZYRTEC) 10 MG chewable tablet Chew 10 mg by mouth daily.    [provider]  Cholecalciferol (VITAMIN D) 50 MCG (2000  UT) tablet Take 2,000 Units by mouth daily.    [provider]  OVER THE COUNTER MEDICATION Place 1-2 drops into both eyes as needed (for dry eyes). "Chevis Pretty for Dry Eyes"    [provider]  polyethylene glycol (MIRALAX / GLYCOLAX) packet Take 17 g by mouth daily.    [provider]  Probiotic Product (PROBIOTIC DAILY PO) Take by mouth.    [provider]  psyllium (METAMUCIL) 58.6 % powder Take 1 packet by mouth as needed (for constipation). Reported on 05/07/2015    [provider]    Family History Family History  Problem Relation Age of Onset   Hypertension Mother    Stroke Mother        Judie Petit, GF   Hypertension Father    Kidney Stones Father    Prostate cancer Father 22   Pancreatic cancer Paternal Aunt    Heart  attack Maternal Grandmother    Coronary artery disease Maternal Grandmother    Hypertension Maternal Grandmother    Prostate cancer Maternal Grandfather 42   Breast cancer Paternal Grandmother    Diabetes Paternal Grandfather    Colon cancer Neg Hx    Esophageal cancer Neg Hx    Kidney disease Neg Hx    Liver cancer Neg Hx    Rectal cancer Neg Hx    Stomach cancer Neg Hx     Social History Social History   Tobacco Use   Smoking status: Never   Smokeless tobacco: Never  Vaping Use   Vaping Use: Never used  Substance Use Topics   Alcohol use: No    Alcohol/week: 0.0 standard drinks of alcohol   Drug use: No     Allergies   Amoxicillin and Vancomycin   Review of Systems Review of Systems  As noted in HPI Physical Exam Triage Vital Signs ED Triage Vitals  Enc Vitals Group     BP 09/04/22 1010 (!) 153/87     Pulse Rate 09/04/22 1010 67     Resp 09/04/22 1010 17     Temp 09/04/22 1010 98.2 F (36.8 C)     Temp Source 09/04/22 1010 Oral     SpO2 09/04/22 1010 98 %     Weight 09/04/22 1009 205 lb (93 kg)     Height 09/04/22 1009 5\' 11"  (1.803 m)     Head Circumference --      Peak Flow --      Pain Score 09/04/22 1009 0     Pain Loc --      Pain Edu? --      Excl. in GC? --    No data found.  Updated Vital Signs BP (!) 153/87 (BP Location: Right Arm)   Pulse 67   Temp 98.2 F (36.8 C) (Oral)   Resp 17   Ht 5\' 11"  (1.803 m)   Wt 205 lb (93 kg)   SpO2 98%   BMI 28.59 kg/m   Visual Acuity Right Eye Distance:   Left Eye Distance:   Bilateral Distance:    Right Eye Near:   Left Eye Near:    Bilateral Near:     Physical Exam Vitals and nursing note reviewed.  Constitutional:      General: He is not in acute distress.    Appearance: He is not toxic-appearing.  HENT:     Right Ear: External ear normal.     Left Ear: External ear normal.  Eyes:  General: No scleral icterus.    Conjunctiva/sclera: Conjunctivae normal.  Pulmonary:      Effort: Pulmonary effort is normal.  Musculoskeletal:        General: Normal range of motion.     Cervical back: Neck supple.  Skin:    General: Skin is warm and dry.     Findings: No bruising, erythema or rash.  Neurological:     Mental Status: He is alert and oriented to person, place, and time.     Gait: Gait normal.  Psychiatric:        Mood and Affect: Mood normal.        Behavior: Behavior normal.        Thought Content: Thought content normal.        Judgment: Judgment normal.      UC Treatments / Results  Labs (all labs ordered are listed, but only abnormal results are displayed) Labs Reviewed  CBC  COMPREHENSIVE METABOLIC PANEL    EKG   Radiology No results found.  Procedures Procedures (including critical care time)  Medications Ordered in UC Medications - No data to display  Initial Impression / Assessment and Plan / UC Course  I have reviewed the triage vital signs and the nursing notes.  Intermittent pruritus  I ordered CBC and CMP and we will inform him when results area back. In the mean time I placed him on Periactin as noted for itching.  Asked to FU with PCP if labs are normal and symptoms persist.  Final Clinical Impressions(s) / UC Diagnoses   Final diagnoses:  Pruritus     Discharge Instructions      Make sure you follow up with your primary care if the labs are normal and the itching persists without taking medication.      ED Prescriptions     Medication Sig Dispense Auth. Provider   cyproheptadine (PERIACTIN) 4 MG tablet Take 1 tablet (4 mg total) by mouth 3 (three) times daily. Prn itching 30 tablet Rodriguez-Southworth, Nettie Elm, PA-C      PDMP not reviewed this encounter.   Garey Ham, PA-C 09/04/22 1050

## 2022-09-04 NOTE — Discharge Instructions (Signed)
Make sure you follow up with your primary care if the labs are normal and the itching persists without taking medication.

## 2022-09-05 ENCOUNTER — Encounter: Payer: Self-pay | Admitting: Gastroenterology

## 2022-09-11 ENCOUNTER — Ambulatory Visit (HOSPITAL_BASED_OUTPATIENT_CLINIC_OR_DEPARTMENT_OTHER): Payer: BC Managed Care – PPO

## 2022-09-11 ENCOUNTER — Other Ambulatory Visit (HOSPITAL_BASED_OUTPATIENT_CLINIC_OR_DEPARTMENT_OTHER): Payer: BC Managed Care – PPO

## 2022-09-11 DIAGNOSIS — I7121 Aneurysm of the ascending aorta, without rupture: Secondary | ICD-10-CM | POA: Diagnosis not present

## 2022-09-11 DIAGNOSIS — I10 Essential (primary) hypertension: Secondary | ICD-10-CM

## 2022-09-11 LAB — ECHOCARDIOGRAM COMPLETE
AR max vel: 3.82 cm2
AV Area VTI: 3.68 cm2
AV Area mean vel: 4.09 cm2
AV Mean grad: 2 mmHg
AV Peak grad: 4.7 mmHg
Ao pk vel: 1.08 m/s
Area-P 1/2: 2.72 cm2
Calc EF: 62.3 %
S' Lateral: 2.81 cm
Single Plane A2C EF: 68.9 %
Single Plane A4C EF: 57.6 %

## 2022-09-14 ENCOUNTER — Encounter: Payer: Self-pay | Admitting: Gastroenterology

## 2022-09-14 ENCOUNTER — Ambulatory Visit (AMBULATORY_SURGERY_CENTER): Payer: BC Managed Care – PPO | Admitting: Gastroenterology

## 2022-09-14 VITALS — BP 119/77 | HR 52 | Temp 99.3°F | Resp 10 | Ht 71.0 in | Wt 205.0 lb

## 2022-09-14 DIAGNOSIS — Z86010 Personal history of colonic polyps: Secondary | ICD-10-CM

## 2022-09-14 DIAGNOSIS — Z09 Encounter for follow-up examination after completed treatment for conditions other than malignant neoplasm: Secondary | ICD-10-CM

## 2022-09-14 MED ORDER — SODIUM CHLORIDE 0.9 % IV SOLN: 500.0000 mL | Freq: Once | INTRAVENOUS | Status: DC

## 2022-09-14 NOTE — Progress Notes (Signed)
Pt's states no medical or surgical changes since previsit or office visit. 

## 2022-09-14 NOTE — Op Note (Signed)
[facility] Patient Name: Gregory Klein Procedure Date: 09/14/2022 3:47 PM MRN: [id number] Endoscopist: Meryl Dare , MD, [phone] Age: 57 Referring MD:  Date of Birth: 07-23-1965 Gender: Male Account #: [id number] Procedure:                Colonoscopy Indications:              Surveillance: Personal history of adenomatous                            polyps on last colonoscopy 5 years ago Medicines:                Monitored Anesthesia Care Procedure:                Pre-Anesthesia Assessment:                           - Prior to the procedure, a History and Physical                            was performed, and patient medications and                            allergies were reviewed. The patient's tolerance of                            previous anesthesia was also reviewed. The risks                            and benefits of the procedure and the sedation                            options and risks were discussed with the patient.                            All questions were answered, and informed consent                            was obtained. Prior Anticoagulants: The patient has                            taken no anticoagulant or antiplatelet agents. ASA                            Grade Assessment: II - A patient with mild systemic                            disease. After reviewing the risks and benefits,                            the patient was deemed in satisfactory condition to                            undergo the procedure.  After obtaining informed consent, the colonoscope                            was passed under direct vision. Throughout the                            procedure, the patient's blood pressure, pulse, and                            oxygen saturations were monitored continuously. The                            Olympus CF-HQ190L [id number]) Colonoscope was                            introduced through the anus  and advanced to the the                            cecum, identified by the appendiceal orifice, IC                            valve and transillumination. The ileocecal valve,                            appendiceal orifice, and rectum were photographed.                            The quality of the bowel preparation was good. The                            colonoscopy was performed without difficulty. The                            patient tolerated the procedure well. Scope In: 3:56:23 PM Scope Out: 4:11:57 PM Scope Withdrawal Time: 0 hours 11 minutes 13 seconds  Total Procedure Duration: 0 hours 15 minutes 34 seconds  Findings:                 The perianal and digital rectal examinations were                            normal.                           External hemorrhoids were found during                            retroflexion. The hemorrhoids were small.                           The exam was otherwise without abnormality on                            direct and retroflexion views. Complications:            No immediate complications. Estimated  blood loss:                            None. Estimated Blood Loss:     Estimated blood loss: none. Impression:               - External hemorrhoids.                           - The examination was otherwise normal on direct                            and retroflexion views.                           - No specimens collected. Recommendation:           - Repeat colonoscopy in 10 years for surveillance.                           - Patient has a contact number available for                            emergencies. The signs and symptoms of potential                            delayed complications were discussed with the                            patient. Return to normal activities tomorrow.                            Written discharge instructions were provided to the                            patient.                           - Resume  previous diet.                           - Continue present medications. Meryl Dare, MD 09/14/2022 4:17:45 PM This report has been signed electronically.

## 2022-09-14 NOTE — Progress Notes (Unsigned)
Sedate, gd SR, tolerated procedure well, VSS, report to RN 

## 2022-09-14 NOTE — Patient Instructions (Signed)
   Repeat Colonoscopy in 10 years    YOU HAD AN ENDOSCOPIC PROCEDURE TODAY AT [facility]:   Refer to the procedure report that was given to you for any specific questions about what was found during the examination.  If the procedure report does not answer your questions, please call your gastroenterologist to clarify.  If you requested that your care partner not be given the details of your procedure findings, then the procedure report has been included in a sealed envelope for you to review at your convenience later.  YOU SHOULD EXPECT: Some feelings of bloating in the abdomen. Passage of more gas than usual.  Walking can help get rid of the air that was put into your GI tract during the procedure and reduce the bloating. If you had a lower endoscopy (such as a colonoscopy or flexible sigmoidoscopy) you may notice spotting of blood in your stool or on the toilet paper. If you underwent a bowel prep for your procedure, you may not have a normal bowel movement for a few days.  Please Note:  You might notice some irritation and congestion in your nose or some drainage.  This is from the oxygen used during your procedure.  There is no need for concern and it should clear up in a day or so.  SYMPTOMS TO REPORT IMMEDIATELY:  Following lower endoscopy (colonoscopy or flexible sigmoidoscopy):  Excessive amounts of blood in the stool  Significant tenderness or worsening of abdominal pains  Swelling of the abdomen that is new, acute  Fever of 100F or higher   For urgent or emergent issues, a gastroenterologist can be reached at any hour by calling [phone]. Do not use MyChart messaging for urgent concerns.    DIET:  We do recommend a small meal at first, but then you may proceed to your regular diet.  Drink plenty of fluids but you should avoid alcoholic beverages for 24 hours.  ACTIVITY:  You should plan to take it easy for the rest of today and you should NOT DRIVE or use  heavy machinery until tomorrow (because of the sedation medicines used during the test).    FOLLOW UP: Our staff will call the number listed on your records the next business day following your procedure.  We will call around 7:15- 8:00 am to check on you and address any questions or concerns that you may have regarding the information given to you following your procedure. If we do not reach you, we will leave a message.     If any biopsies were taken you will be contacted by phone or by letter within the next 1-3 weeks.  Please call us at [phone] if you have not heard about the biopsies in 3 weeks.    SIGNATURES/CONFIDENTIALITY: You and/or your care partner have signed paperwork which will be entered into your electronic medical record.  These signatures attest to the fact that that the information above on your After Visit Summary has been reviewed and is understood.  Full responsibility of the confidentiality of this discharge information lies with you and/or your care-partner.

## 2022-09-14 NOTE — Progress Notes (Signed)
History & Physical  Primary Care Physician:  [name], MD Primary Gastroenterologist: Claudette Head, MD  Impression / Plan:  Personal history of adenomatous colon polyps for surveillance colonoscopy  CHIEF COMPLAINT:  Personal history of colon polyps   HPI: Gregory Klein is a 57 y.o. male with a personal history of adenomatous colon polyps for surveillance colonoscopy.    Past Medical History:  Diagnosis Date   Achilles tendonitis, bilateral 2016   [name], DPM   Acute medial meniscus tear of right knee    Acute myocarditis 2015   viral vs d/t strep infection, resolved w/o sequela   Allergy    Anxiety    Arthritis    Ascending aortic aneurysm (HCC) 11/02/2021   Clotting disorder (HCC)    Deformity of metatarsal    Bilateral, mild DJD at the right 1st MTP joint and bilateral decrease in calcaneal inclination angle   DVT (deep venous thrombosis) (HCC)    GERD (gastroesophageal reflux disease)    Hemorrhoids    hx of   Hepatitis B infection    hx of dx 2005 aprox   Hyperglycemia 02/22/2015   Hypertension    Neck pain    local injections x 2, las summer 2011 (Dr.Vortek)   Plantar fasciitis, bilateral 2016   [name], DPM   Sleep apnea    on CPAP     Past Surgical History:  Procedure Laterality Date   INGUINAL HERNIA REPAIR     KNEE ARTHROSCOPY  6-15   Dr Dion Saucier   KNEE ARTHROSCOPY WITH MEDIAL MENISECTOMY Right 09/26/2018   LEFT HEART CATHETERIZATION WITH CORONARY ANGIOGRAM N/A 12/29/2013   Procedure: LEFT HEART CATHETERIZATION WITH CORONARY ANGIOGRAM;  Surgeon: Wendall Stade, MD;  Location: [facility] CATH LAB;  Service: Cardiovascular;  Laterality: N/A;    Prior to Admission medications   Medication Sig Start Date End Date Taking? Authorizing Provider  carvedilol (COREG) 12.5 MG tablet Take 1 tablet (12.5 mg total) by mouth 2 (two) times daily. 11/02/21  Yes Chilton Si, MD  losartan (COZAAR) 50 MG tablet TAKE 1 TABLET(50 MG) BY MOUTH DAILY 04/26/22   Yes Paz, Nolon Rod, MD  OVER THE COUNTER MEDICATION Place 1-2 drops into both eyes as needed (for dry eyes). "Shellia [employer] for Dry Eyes"   Yes [provider]  aspirin EC 81 MG tablet Take 81 mg by mouth daily. Swallow whole.    [provider]  cetirizine (ZYRTEC) 10 MG chewable tablet Chew 10 mg by mouth daily.    [provider]  Cholecalciferol (VITAMIN D) 50 MCG (2000 UT) tablet Take 2,000 Units by mouth daily.    [provider]  cyproheptadine (PERIACTIN) 4 MG tablet Take 1 tablet (4 mg total) by mouth 3 (three) times daily. Prn itching 09/04/22   Rodriguez-Southworth, Nettie Elm, PA-C  polyethylene glycol (MIRALAX / GLYCOLAX) packet Take 17 g by mouth daily.    [provider]  Probiotic Product (PROBIOTIC DAILY PO) Take by mouth.    [provider]  psyllium (METAMUCIL) 58.6 % powder Take 1 packet by mouth as needed (for constipation). Reported on 05/07/2015    [provider]  valACYclovir (VALTREX) 1000 MG tablet TAKE 2 TABLETS BY MOUTH TWICE DAILY AS NEEDED FOR OUTBREAK 12/05/21   [name], MD    Current Outpatient Medications  Medication Sig Dispense Refill   carvedilol (COREG) 12.5 MG tablet Take 1 tablet (12.5 mg total) by mouth 2 (two) times daily. 180 tablet  3   losartan (COZAAR) 50 MG tablet TAKE 1 TABLET(50 MG) BY MOUTH DAILY 90 tablet 1   OVER THE COUNTER MEDICATION Place 1-2 drops into both eyes as needed (for dry eyes). "Shellia Cle[employer] Lomb for Dry Eyes"     aspirin EC 81 MG tablet Take 81 mg by mouth daily. Swallow whole.     cetirizine (ZYRTEC) 10 MG chewable tablet Chew 10 mg by mouth daily.     Cholecalciferol (VITAMIN D) 50 MCG (2000 UT) tablet Take 2,000 Units by mouth daily.     cyproheptadine (PERIACTIN) 4 MG tablet Take 1 tablet (4 mg total) by mouth 3 (three) times daily. Prn itching 30 tablet 0   polyethylene glycol (MIRALAX / GLYCOLAX) packet Take 17 g by mouth daily.     Probiotic Product (PROBIOTIC DAILY PO)  Take by mouth.     psyllium (METAMUCIL) 58.6 % powder Take 1 packet by mouth as needed (for constipation). Reported on 05/07/2015     valACYclovir (VALTREX) 1000 MG tablet TAKE 2 TABLETS BY MOUTH TWICE DAILY AS NEEDED FOR OUTBREAK 30 tablet 6   Current Facility-Administered Medications  Medication Dose Route Frequency Provider Last Rate Last Admin   0.9 %  sodium chloride infusion  500 mL Intravenous Once Meryl Dare, MD        Allergies as of 09/14/2022 - Review Complete 09/14/2022  Allergen Reaction Noted   Amoxicillin Rash 06/12/2007   Vancomycin Itching 06/07/2021    Family History  Problem Relation Age of Onset   Hypertension Mother    Stroke Mother        Read Drivers   Hypertension Father    Kidney Stones Father    Prostate cancer Father 43   Pancreatic cancer Paternal Aunt    Heart attack Maternal Grandmother    Coronary artery disease Maternal Grandmother    Hypertension Maternal Grandmother    Prostate cancer Maternal Grandfather 4   Breast cancer Paternal Grandmother    Diabetes Paternal Grandfather    Colon cancer Neg Hx    Esophageal cancer Neg Hx    Kidney disease Neg Hx    Liver cancer Neg Hx    Rectal cancer Neg Hx    Stomach cancer Neg Hx     Social History   Socioeconomic History   Marital status: Single    Spouse name: Not on file   Number of children: 0   Years of education: Not on file   Highest education level: Not on file  Occupational History   Occupation: Set designer: [employer]  Tobacco Use   Smoking status: Never   Smokeless tobacco: Never  Vaping Use   Vaping status: Never Used  Substance and Sexual Activity   Alcohol use: Yes    Comment: Occasional beer   Drug use: No   Sexual activity: Not on file  Other Topics Concern   Not on file  Social History Narrative    Lives by himself   Social Determinants of Health   Financial Resource Strain: Not on file  Food Insecurity: Not on file  Transportation Needs:  Not on file  Physical Activity: Not on file  Stress: Not on file  Social Connections: Not on file  Intimate Partner Violence: Not on file    Review of Systems:  All systems reviewed were negative except where noted in HPI.   Physical Exam:  General:  Alert, well-developed, in NAD Head:  Normocephalic and atraumatic. Eyes:  Sclera  clear, no icterus.   Conjunctiva pink. Ears:  Normal auditory acuity. Mouth:  No deformity or lesions.  Neck:  Supple; no masses. Lungs:  Clear throughout to auscultation.   No wheezes, crackles, or rhonchi.  Heart:  Regular rate and rhythm; no murmurs. Abdomen:  Soft, nondistended, nontender. No masses, hepatomegaly. No palpable masses.  Normal bowel sounds.    Rectal:  Deferred   Msk:  Symmetrical without gross deformities. Extremities:  Without edema. Neurologic:  Alert and  oriented x 4; grossly normal neurologically. Skin:  Intact without significant lesions or rashes. Psych:  Alert and cooperative. Normal mood and affect.   Venita Lick. Russella Dar  09/14/2022, 3:47 PM See Loretha Stapler, [facility], to contact our on call provider

## 2022-09-15 ENCOUNTER — Telehealth: Payer: Self-pay | Admitting: *Deleted

## 2022-09-15 NOTE — Telephone Encounter (Signed)
  Follow up Call-     09/14/2022    3:19 PM 06/07/2021    9:03 AM  Call back number  Post procedure Call Back phone  # [phone]  Permission to leave phone message Yes      Patient questions:  Do you have a fever, pain , or abdominal swelling? No. Pain Score  0 *  Have you tolerated food without any problems? Yes.    Have you been able to return to your normal activities? Yes.    Do you have any questions about your discharge instructions: Diet   No. Medications  No. Follow up visit  No.  Do you have questions or concerns about your Care? No.  Actions: * If pain score is 4 or above: No action needed, pain <4.

## 2022-09-18 ENCOUNTER — Ambulatory Visit (HOSPITAL_BASED_OUTPATIENT_CLINIC_OR_DEPARTMENT_OTHER): Payer: BC Managed Care – PPO | Admitting: Family

## 2022-09-18 ENCOUNTER — Encounter (HOSPITAL_BASED_OUTPATIENT_CLINIC_OR_DEPARTMENT_OTHER): Payer: Self-pay | Admitting: Family

## 2022-09-18 VITALS — BP 136/84 | HR 65 | Ht 71.0 in | Wt 212.0 lb

## 2022-09-18 DIAGNOSIS — I10 Essential (primary) hypertension: Secondary | ICD-10-CM | POA: Diagnosis not present

## 2022-09-18 DIAGNOSIS — R7303 Prediabetes: Secondary | ICD-10-CM

## 2022-09-18 DIAGNOSIS — I7121 Aneurysm of the ascending aorta, without rupture: Secondary | ICD-10-CM | POA: Diagnosis not present

## 2022-09-18 DIAGNOSIS — Z6829 Body mass index (BMI) 29.0-29.9, adult: Secondary | ICD-10-CM | POA: Diagnosis not present

## 2022-09-18 DIAGNOSIS — G4733 Obstructive sleep apnea (adult) (pediatric): Secondary | ICD-10-CM

## 2022-09-18 NOTE — Patient Instructions (Addendum)
Medication Instructions:  Continue your current medications.   *If you need a refill on your cardiac medications before your next appointment, please call your pharmacy*   Lab Work: Your physician recommends that you return for lab work today: A1c, lipid panel, CMP  If you have labs (blood work) drawn today and your tests are completely normal, you will receive your results only by: MyChart Message (if you have MyChart) OR A paper copy in the mail If you have any lab test that is abnormal or we need to change your treatment, we will call you to review the results.  Follow-Up: At [facility], you and your health needs are our priority.  As part of our continuing mission to provide you with exceptional heart care, we have created designated Provider Care Teams.  These Care Teams include your primary Cardiologist (physician) and Advanced Practice Providers (APPs -  Physician Assistants and Nurse Practitioners) who all work together to provide you with the care you need, when you need it.  We recommend signing up for the patient portal called "MyChart".  Sign up information is provided on this After Visit Summary.  MyChart is used to connect with patients for Virtual Visits (Telemedicine).  Patients are able to view lab/test results, encounter notes, upcoming appointments, etc.  Non-urgent messages can be sent to your provider as well.   To learn more about what you can do with MyChart, go to [email or web address].    Your next appointment:   4 month(s)  Provider:   Chilton Si, MD or Gillian Shields, NP    Other Instructions  Heart Healthy Diet Recommendations: A low-salt diet is recommended. Meats should be grilled, baked, or boiled. Avoid fried foods. Focus on lean protein sources like fish or chicken with vegetables and fruits. The American Heart Association is a Chief Technology Officer!  American Heart Association Diet and Lifeystyle Recommendations   Exercise  recommendations: The American Heart Association recommends 150 minutes of moderate intensity exercise weekly. Try 30 minutes of moderate intensity exercise 4-5 times per week. This could include walking, jogging, or swimming.

## 2022-09-18 NOTE — Progress Notes (Signed)
Cardiology Office Note:  .   Date:  09/18/2022  ID:  Gregory Klein, DOB Dec 22, 1965, MRN [id number] PCP: [name], MD  [facility] Providers Cardiologist:  None    History of Present Illness: .   Gregory Klein is a 57 y.o. male with history of acute myocarditis, DVT, hypertension, prediabetes, GERD, sleep apnea on CPAP.    Prior echocardiogram 2015 LVEF 50 to 55%, gr1dd, aortic root 4.0cm. Prior calcium score 09/20/2021 of 0.  There was aneurysmal dilation of ascending thoracic aorta 4.8 cm recommended for annual follow-up.   Initially diagnosed with hypertension in his 60s.  At visit 10/2021 he was started on carvedilol.  At visit 11/2021 BP 130s over 80s recommend to increase dose losartan but he declined.  Last seen 05/16/2022 with plan to increase physical activity and adjust diet for weight loss, or reduction in A1c.  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 Weight up 7 pounds from visit 4 mos ago. Just had colonoscopy Thursday with good results. Feels this is a good starting point for diet and exercise. Does eat out often choosing fast food near work. Previously had someone helping him with meal prep and plans to resume.  Reports no shortness of breath nor dyspnea on exertion. Reports no chest pain, pressure, or tightness. No edema, orthopnea, PND. Reports no palpitations.    ROS: Please see the history of present illness.    All other systems reviewed and are negative.   Studies Reviewed: .        Cardiac Studies & Procedures       ECHOCARDIOGRAM  ECHOCARDIOGRAM COMPLETE 09/11/2022  Narrative ECHOCARDIOGRAM REPORT    Patient Name:   Gregory Klein Date of Exam: 09/11/2022 Medical Rec #:  [id number]     Height:       71.0 in Accession #:    [id number]    Weight:       205.0 lb Date of Birth:  [birth year]     BSA:           2.131 m Patient Age:    57 years      BP:           126/68 mmHg Patient Gender: M             HR:           61 bpm. 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 CHF; Risk Factors:Hypertension, Sleep Apnea, Family History of Coronary Artery Disease, Dyslipidemia and Non-Smoker. Patient denies chest pain, SOB and leg edema.  Sonographer:    Carlos American RVT, RDCS (AE), RDMS Referring Phys: [phone] [facility] [place]   Sonographer Comments: Global longitudinal strain was attempted. IMPRESSIONS   1. Left ventricular ejection fraction, by estimation, is 60 to 65%. Left ventricular ejection fraction by 3D volume is 60 %. The left ventricle has normal function. The left ventricle has no regional wall motion abnormalities. Left ventricular diastolic parameters are consistent with Grade I diastolic dysfunction (impaired relaxation). 2. Right ventricular systolic function is normal. The right ventricular size is normal. There is normal pulmonary artery systolic pressure. The estimated right ventricular systolic  pressure is 26.5 mmHg. 3. The mitral valve is abnormal. Trivial mitral valve regurgitation. 4. The aortic valve is tricuspid. Aortic valve regurgitation is not visualized. 5. Aortic dilatation noted. There is mild dilatation of the aortic root, measuring 40 mm. There is mild dilatation of the ascending aorta, measuring 41 mm. 6. The inferior vena cava is normal in size with <50% respiratory variability, suggesting right atrial pressure of 8 mmHg.  Comparison(s): Changes from prior study are noted. 02/17/2014: LVEF 50%, AOR 40mm.  FINDINGS Left Ventricle: Left ventricular ejection fraction, by estimation, is 60 to 65%. Left ventricular ejection fraction by 3D volume is 60 %. The left ventricle has  normal function. The left ventricle has no regional wall motion abnormalities. The left ventricular internal cavity size was normal in size. There is no left ventricular hypertrophy. Left ventricular diastolic parameters are consistent with Grade I diastolic dysfunction (impaired relaxation). Indeterminate filling pressures.  Right Ventricle: The right ventricular size is normal. No increase in right ventricular wall thickness. Right ventricular systolic function is normal. There is normal pulmonary artery systolic pressure. The tricuspid regurgitant velocity is 2.15 m/s, and with an assumed right atrial pressure of 8 mmHg, the estimated right ventricular systolic pressure is 26.5 mmHg.  Left Atrium: Left atrial size was normal in size.  Right Atrium: Right atrial size was normal in size.  Pericardium: There is no evidence of pericardial effusion.  Mitral Valve: The mitral valve is abnormal. There is mild thickening of the anterior and posterior mitral valve leaflet(s). Trivial mitral valve regurgitation.  Tricuspid Valve: The tricuspid valve is grossly normal. Tricuspid valve regurgitation is mild.  Aortic Valve: The aortic valve is tricuspid. Aortic valve regurgitation is not visualized. Aortic valve mean gradient measures 2.0 mmHg. Aortic valve peak gradient measures 4.7 mmHg. Aortic valve area, by VTI measures 3.68 cm.  Pulmonic Valve: The pulmonic valve was grossly normal. Pulmonic valve regurgitation is mild.  Aorta: Aortic dilatation noted. There is mild dilatation of the aortic root, measuring 40 mm. There is mild dilatation of the ascending aorta, measuring 41 mm.  Venous: The inferior vena cava is normal in size with less than 50% respiratory variability, suggesting right atrial pressure of 8 mmHg.  IAS/Shunts: No atrial level shunt detected by color flow Doppler.   LEFT VENTRICLE PLAX 2D LVIDd:         4.36 cm         Diastology LVIDs:         2.81 cm         LV e' medial:     6.47 cm/s LV PW:         1.04 cm         LV E/e' medial:  10.3 LV IVS:        1.10 cm         LV e' lateral:   6.41 cm/s LVOT diam:     2.40 cm         LV E/e' lateral: 10.4 LV SV:         81 LV SV Index:   38 LVOT Area:     4.52 cm        3D Volume EF LV 3D EF:    Left ventricul LV Volumes (MOD)                            ar LV vol d, MOD    85.2 ml  ejection A2C:                                        fraction LV vol d, MOD    84.4 ml                    by 3D A4C:                                        volume is LV vol s, MOD    26.5 ml                    60 %. A2C: LV vol s, MOD    35.8 ml A4C:                           3D Volume EF: LV SV MOD A2C:   58.7 ml       3D EF:        60 % LV SV MOD A4C:   84.4 ml       LV EDV:       97 ml LV SV MOD BP:    52.0 ml       LV ESV:       39 ml LV SV:        58 ml  RIGHT VENTRICLE RV S prime:     14.10 cm/s TAPSE (M-mode): 2.5 cm  LEFT ATRIUM             Index        RIGHT ATRIUM           Index LA diam:        2.70 cm 1.27 cm/m   RA Area:     19.20 cm LA Vol (A2C):   56.2 ml 26.38 ml/m  RA Volume:   57.90 ml  27.17 ml/m LA Vol (A4C):   45.3 ml 21.26 ml/m LA Biplane Vol: 54.3 ml 25.48 ml/m AORTIC VALVE                    PULMONIC VALVE AV Area (Vmax):    3.82 cm     PV Vmax:          0.74 m/s AV Area (Vmean):   4.09 cm     PV Peak grad:     2.2 mmHg AV Area (VTI):     3.68 cm     PR End Diast Vel: 3.82 msec AV Vmax:           108.00 cm/s AV Vmean:          69.300 cm/s AV VTI:            0.221 m AV Peak Grad:      4.7 mmHg AV Mean Grad:      2.0 mmHg LVOT Vmax:         91.30 cm/s LVOT Vmean:        62.700 cm/s LVOT VTI:          0.180 m LVOT/AV VTI ratio: 0.81  AORTA Ao Root diam: 4.00 cm Ao Asc diam:  4.10 cm Ao Arch diam: 3.7 cm  MITRAL VALVE  TRICUSPID VALVE MV Area (PHT): 2.72 cm    TR Peak grad:   18.5 mmHg MV Decel Time: 279 msec    TR Vmax:        215.00 cm/s MV E velocity:  66.50 cm/s MV A velocity: 78.10 cm/s  SHUNTS MV E/A ratio:  0.85        Systemic VTI:  0.18 m Systemic Diam: 2.40 cm  Zoila Shutter MD Electronically signed by Zoila Shutter MD Signature Date/Time: 09/11/2022/3:50:46 PM    Final     CT SCANS  CT CARDIAC SCORING (SELF PAY ONLY) 09/20/2021  Addendum 09/20/2021  4:09 PM ADDENDUM REPORT: 09/20/2021 16:07  EXAM: OVER-READ INTERPRETATION  CT CHEST  The following report is an over-read performed by radiologist Dr. Karle Barr [facility] on 09/20/2021. This over-read does not include interpretation of cardiac or coronary anatomy or pathology. The coronary artery calcium score interpretation by the cardiologist is attached.  COMPARISON:  None.  FINDINGS: Aneurysmal dilatation ascending thoracic aorta 4.0 cm transverse image 10. Heart size upper normal. No pericardial effusion.  Esophagus unremarkable. Visualized upper abdomen unremarkable. No thoracic adenopathy.  5 mm focus of nodular thickening at RIGHT major fissure image 12. 5 mm focus of nodular pleural thickening LEFT major fissure image 14. Minimal subsegmental atelectasis LEFT lower lobe. Remaining lungs clear. No acute osseous findings.  IMPRESSION: Aneurysmal dilatation ascending thoracic aorta 4.0 cm transverse; Recommend annual imaging followup by CTA or MRA. This recommendation follows 2010 ACCF/AHA/AATS/ACR/ASA/SCA/SCAI/SIR/STS/SVM Guidelines for the Diagnosis and Management of Patients with Thoracic Aortic Disease. Circulation. 2010; 121: Z610-R604. Aortic aneurysm NOS (ICD10-I71.9)  Nodular foci of pleural thickening at the major fissures bilaterally; no routine follow-up recommended.   Electronically Signed By: Ulyses Southward M.D. On: 09/20/2021 16:07  Narrative CLINICAL DATA:  Cardiovascular Disease Risk stratification  EXAM: Coronary Calcium Score  TECHNIQUE: A gated, non-contrast computed tomography scan of the heart was performed  using 3mm slice thickness. Axial images were analyzed on a dedicated workstation. Calcium scoring of the coronary arteries was performed using the Agatston method.  MEDICATIONS: MEDICATIONS None  FINDINGS: Coronary arteries: Normal origins.  Coronary Calcium Score:  Left main: 0  Left anterior descending artery: 0  Left circumflex artery: 0  Right coronary artery: 0  Total: 0  Pericardium: Normal.  Ascending Aorta: Mildly dilated, 4.0 cm.  Non-cardiac: See separate report from [facility].  IMPRESSION: Coronary calcium score of 0 Agatston units. This suggests low risk for future cardiac events.  RECOMMENDATIONS: Coronary artery calcium (CAC) score is a strong predictor of incident coronary heart disease (CHD) and provides predictive information beyond traditional risk factors. CAC scoring is reasonable to use in the decision to withhold, postpone, or initiate statin therapy in intermediate-risk or selected borderline-risk asymptomatic adults (age 80-75 years and LDL-C >=70 to <190 mg/dL) who do not have diabetes or established atherosclerotic cardiovascular disease (ASCVD).* In intermediate-risk (10-year ASCVD risk >=7.5% to <20%) adults or selected borderline-risk (10-year ASCVD risk >=5% to <7.5%) adults in whom a CAC score is measured for the purpose of making a treatment decision the following recommendations have been made:  If CAC=0, it is reasonable to withhold statin therapy and reassess in 5 to 10 years, as long as higher risk conditions are absent (diabetes mellitus, family history of premature CHD in first degree relatives (males <55 years; females <65 years), cigarette smoking, or LDL >=190 mg/dL).  If CAC is 1 to 99, it is reasonable to initiate statin therapy for patients >=55 years  of age.  If CAC is >=100 or >=75th percentile, it is reasonable to initiate statin therapy at any age.  Cardiology referral should be considered for  patients with CAC scores >=400 or >=75th percentile.  *2018 AHA/ACC/AACVPR/AAPA/ABC/ACPM/ADA/AGS/APhA/ASPC/NLA/PCNA Guideline on the Management of Blood Cholesterol: A Report of the American College of Cardiology/American Heart Association Task Force on Clinical Practice Guidelines. J Am Coll Cardiol. 2019;73(24):3168-3209.  Marca Ancona, MD  Electronically Signed: By: Marca Ancona M.D. On: 09/20/2021 15:12   CARDIAC MRI  MR CARDIAC MORPHOLOGY W WO CONTRAST 12/29/2013  Narrative CLINICAL DATA:  Myocarditis  EXAM: CARDIAC MRI  TECHNIQUE: The patient was scanned on a 1.5 Tesla GE magnet. A dedicated cardiac coil was used. Functional imaging was done using Fiesta sequences. 2,3, and 4 chamber views were done to assess for RWMA's. Modified Simpson's rule using a short axis stack was used to calculate an ejection fraction on a dedicated work [employer]. The patient received 30 cc of Multihance. After 10 minutes inversion recovery sequences were used to assess for infiltration and scar tissue.  CONTRAST:  30 cc Multihance  FINDINGS: There was mild LVE. No HOCM or LVH. The ascending aorta was normal. The LA/RA/RV were normal in size and function. The mitral, tricuspid and aortic valves were normal There was no ASD/VSD and no significant pericardial effusion. The quantitative EF was 49% with distal septal and apical hypokinesis. Delayed enhancement images showed spotty uptake in the inferobasal myocardium, anterior apical myocardium and pericardium  IMPRESSION: 1) Mild LVE EF 49%  2) Normal cardiac valves  3) No pericardial effusion  4) Delayed gadolinium uptake in the inferobasal wall, apical myocardium and pericardium In the setting of elevated troponin, ECG changes and no hemodynamically significant lesions on cath this would be consistent with myocarditis  Charlton Haws   Electronically Signed By: Charlton Haws M.D. On: 12/29/2013 17:57          Risk Assessment/Calculations:             Physical Exam:   VS:  BP 136/84   Pulse 65   Ht 5\' 11"  (1.803 m)   Wt 212 lb (96.2 kg)   BMI 29.57 kg/m    Wt Readings from Last 3 Encounters:  09/18/22 212 lb (96.2 kg)  09/14/22 205 lb (93 kg)  09/04/22 205 lb (93 kg)    GEN: Well nourished, well developed in no acute distress NECK: No JVD; No carotid bruits CARDIAC: RRR, no murmurs, rubs, gallops RESPIRATORY:  Clear to auscultation without rales, wheezing or rhonchi  ABDOMEN: Soft, non-tender, non-distended EXTREMITIES:  No edema; No deformity   ASSESSMENT AND PLAN: .    HTN - BP mildly elevated in clinic. Requests to check at home for 2 weeks and report back. Continue Coreg 12.5mg  BID, losartan 50mg  every day. If BP not at goal, plan to increase Losartan.   Ascending aortic aneurysm -08/2021 CT 4.0cm. 08/2022 echo 4.1 cm. Repeat echo 1 year for monitoring. BP control, as above. Continue beta blocker.   OSA - CPAP compliance encouraged.   HLD - 04/2022 LDL 135 with plan to make lifestyle changes. Unfortunately, has not made lifestyle changes. Discussed again today. update lipid panel, CMP to assess need for statin.    Obesity / Prediabetes - Weight loss via diet and exercise encouraged. Discussed the impact being overweight would have on cardiovascular risk. Update A1c. Encouraged to participate in Right [employer] exercise program.       Dispo: follow up in  4 months  Signed, Alver Sorrow, NP

## 2022-09-19 ENCOUNTER — Telehealth: Payer: Self-pay | Admitting: Student in an Organized Health Care Education/Training Program

## 2022-09-19 LAB — COMPREHENSIVE METABOLIC PANEL
ALT: 22 IU/L (ref 0–44)
AST: 21 IU/L (ref 0–40)
Albumin: 4.1 g/dL (ref 3.8–4.9)
Alkaline Phosphatase: 74 IU/L (ref 44–121)
BUN/Creatinine Ratio: 10 (ref 9–20)
BUN: 12 mg/dL (ref 6–24)
Bilirubin Total: 0.5 mg/dL (ref 0.0–1.2)
CO2: 27 mmol/L (ref 20–29)
Calcium: 9.5 mg/dL (ref 8.7–10.2)
Chloride: 101 mmol/L (ref 96–106)
Creatinine, Ser: 1.24 mg/dL (ref 0.76–1.27)
Globulin, Total: 3 g/dL (ref 1.5–4.5)
Glucose: 113 mg/dL — ABNORMAL HIGH (ref 70–99)
Potassium: 5.9 mmol/L (ref 3.5–5.2)
Sodium: 140 mmol/L (ref 134–144)
Total Protein: 7.1 g/dL (ref 6.0–8.5)
eGFR: 69 mL/min/{1.73_m2} (ref >59)

## 2022-09-19 LAB — LIPID PANEL
Chol/HDL Ratio: 4.2 ratio (ref 0.0–5.0)
Cholesterol, Total: 212 mg/dL — ABNORMAL HIGH (ref 100–199)
HDL: 50 mg/dL (ref >39)
LDL Chol Calc (NIH): 152 mg/dL — ABNORMAL HIGH (ref 0–99)
Triglycerides: 55 mg/dL (ref 0–149)
VLDL Cholesterol Cal: 10 mg/dL (ref 5–40)

## 2022-09-19 LAB — HEMOGLOBIN A1C
Est. average glucose Bld gHb Est-mCnc: 128 mg/dL
Hgb A1c MFr Bld: 6.1 % — ABNORMAL HIGH (ref 4.8–5.6)

## 2022-09-19 NOTE — Telephone Encounter (Signed)
Returned call to patient, reviewed the following recommendations. Patient would like to work on diet and lifestyle changes. We will recheck cholesterol labs at follow up. Patient had already taken BP medications today, he will hold Losartan tomorrow and come check labs later in the week.      "A1c reveals prediabetes.  Cholesterol is elevated compared to 4 months ago. The 10-year ASCVD (cardiovascular) risk score (Arnett DK, et al., 2019) is: 12.4% which is intermediate. He may either add Rosuvastatin 10mg  daily to help lower cholesterol or work on lifestyle changes and repeat labs at follow up visit.   Normal kidney and liver.  However, potassium elevated which given the rest of the labs are unremarkable could be error. Ensure avoiding potassium supplement, salt substitute, electrolyte drinks as these can raise potassium levels. Recommend hold Losartan today and repeat BMP Wednesday or Thursday for monitoring."

## 2022-09-19 NOTE — Telephone Encounter (Signed)
I was notified by [facility] that the patient has an elevated potassium on labs to 5.9. I attempted to call the patient to inform him of these results, but he did not answer. I sent a message to the ordering provider that his potassium came back elevated so that he can be contacted later this morning.

## 2022-09-21 LAB — COMPREHENSIVE METABOLIC PANEL
ALT: 19 IU/L (ref 0–44)
Albumin: 4.1 g/dL (ref 3.8–4.9)
Alkaline Phosphatase: 76 IU/L (ref 44–121)
BUN/Creatinine Ratio: 10 (ref 9–20)
BUN: 11 mg/dL (ref 6–24)
CO2: 26 mmol/L (ref 20–29)
Calcium: 9.3 mg/dL (ref 8.7–10.2)
Chloride: 100 mmol/L (ref 96–106)
Creatinine, Ser: 1.13 mg/dL (ref 0.76–1.27)
Globulin, Total: 2.8 g/dL (ref 1.5–4.5)
Glucose: 103 mg/dL — ABNORMAL HIGH (ref 70–99)
Potassium: 4.3 mmol/L (ref 3.5–5.2)
Sodium: 139 mmol/L (ref 134–144)
Total Protein: 6.9 g/dL (ref 6.0–8.5)
eGFR: 77 mL/min/{1.73_m2} (ref >59)

## 2022-09-21 LAB — LIPID PANEL
Cholesterol, Total: 193 mg/dL (ref 100–199)
HDL: 48 mg/dL (ref >39)
LDL Chol Calc (NIH): 135 mg/dL — ABNORMAL HIGH (ref 0–99)
VLDL Cholesterol Cal: 10 mg/dL (ref 5–40)

## 2022-09-24 ENCOUNTER — Encounter (HOSPITAL_BASED_OUTPATIENT_CLINIC_OR_DEPARTMENT_OTHER): Payer: Self-pay | Admitting: Family

## 2022-09-25 ENCOUNTER — Telehealth: Payer: Self-pay | Admitting: Cardiovascular Disease

## 2022-09-25 NOTE — Telephone Encounter (Signed)
Pt called in about getting sleep supplies and states the sleep company is requiring a new order from Dr. Tresa Endo. Offered to make him an appt since he hasn't been seen since 2019 but he states he cannot wait until October for new supplies. He wants to know if he can have sleep supplies ordered without making a f/u appt. Please advise.

## 2022-09-27 ENCOUNTER — Telehealth: Payer: Self-pay | Admitting: Cardiovascular Disease

## 2022-09-28 NOTE — Telephone Encounter (Signed)
Left VM, per DPR, notified patient he will need a Sleep Compliance appointment for an order to be placed for new supplies. Left callback number for further assistance with supplies.

## 2022-10-25 ENCOUNTER — Other Ambulatory Visit: Payer: Self-pay | Admitting: Internal Medicine

## 2022-10-27 ENCOUNTER — Other Ambulatory Visit (HOSPITAL_BASED_OUTPATIENT_CLINIC_OR_DEPARTMENT_OTHER): Payer: Self-pay | Admitting: Cardiovascular Disease

## 2022-10-27 NOTE — Telephone Encounter (Signed)
Rx request sent to pharmacy.  

## 2022-12-06 ENCOUNTER — Encounter: Payer: Self-pay | Admitting: Cardiovascular Disease

## 2022-12-06 ENCOUNTER — Ambulatory Visit: Payer: BC Managed Care – PPO | Attending: Cardiovascular Disease | Admitting: Cardiovascular Disease

## 2022-12-06 DIAGNOSIS — I7121 Aneurysm of the ascending aorta, without rupture: Secondary | ICD-10-CM

## 2022-12-06 DIAGNOSIS — Z8679 Personal history of other diseases of the circulatory system: Secondary | ICD-10-CM | POA: Diagnosis not present

## 2022-12-06 DIAGNOSIS — I514 Myocarditis, unspecified: Secondary | ICD-10-CM

## 2022-12-06 DIAGNOSIS — E7800 Pure hypercholesterolemia, unspecified: Secondary | ICD-10-CM

## 2022-12-06 DIAGNOSIS — G4733 Obstructive sleep apnea (adult) (pediatric): Secondary | ICD-10-CM | POA: Diagnosis not present

## 2022-12-06 DIAGNOSIS — R7303 Prediabetes: Secondary | ICD-10-CM

## 2022-12-06 DIAGNOSIS — I10 Essential (primary) hypertension: Secondary | ICD-10-CM | POA: Diagnosis not present

## 2022-12-06 NOTE — Progress Notes (Signed)
Patient ID: Gregory Klein, male   DOB: 1965-10-22, 57 y.o.   MRN: [id number]        HPI: Gregory Klein, is a 57 y.o. male who presents for a 5 year sleep consultation.   [name] is a remote patient of Dr. Jomarie Longs who has a history of myocarditis which developed last year in October 2016.  This has resolved.  He has a history of hypertension and has been taking losartan.  Due to concerns for sleep apnea.  He was referred for a split-night protocol sleep study which was done on 04/02/2014.  This was positive for obstructive sleep apnea with an AHI overall of 21.5, placing him in the moderate sleep apnea category.  However, sleep apnea was severe with REM sleep at 63.8/hr. He had moderate snoring.  His baseline oxygen saturation was 93% and on the diagnostic portion of the study.  The oxygen need tear was 79% during REM sleep.  He had an occasional PAC but was in sinus rhythm.  He was titrated up to a 12 cm water pressure.  He was started on CPAP on 05/18/2014 and has an AirSense 10AutoSet unit which had been set at 12 cm.  He has a Chartered loss adjuster nasal mask.  A 30 day compliance download was obtained from 06/16/2014 through 07/15/2014.  On his present download he was  not meeting compliance standards with only 67% of days of usage an average usage at 4 hours and 8 minutes but only 30% of the days greater than 4 hours.  AHI, however, is excellent at 1.2 with treatment.  I initially saw him in May 2016.  At that time he was often waking up after falling asleep on the couch watching television.  This resulted in his reduced CPAP usage visual daytime sleepiness.  At that time, his Epworth Sleepiness Scale score was increased as shown below.  Epworth Sleepiness Scale: Situation   Chance of Dozing/Sleeping (0 = never , 1 = slight chance , 2 = moderate chance , 3 = high chance )   sitting and reading 2   watching TV 3   sitting inactive in a public place 3   being a passenger in a motor vehicle  for an hour or more 3   lying down in the afternoon 3   sitting and talking to someone 1   sitting quietly after lunch (no alcohol) 1   while stopped for a few minutes in traffic as the driver 1   Total Score  17   I saw him in follow-up on December 03, 2017.  Mr. Hallum admitted to continued use of his CPAP therapy over the last 3+ years.  However, he continues to watch television late at night and falls asleep on the couch.  He may wake up 2 hours later then goes to bed puts his CPAP on.  He typically wakes up between 5 and 5:30 in the morning.  He is an Geophysicist/field seismologist principal at middle school.  He admits to recent increased weight.  He does not routinely exercise.  He has been using the same equipment for the last 3 years.  He has been very satisfied with the Respironics DreamWear nasal mask which is worn out presently.  He also has not had new tubing or filter.  Epworth scale was recalculated in the office today and this was elevated and endorsed at 13.    I have not seen him since 2019.  Presently he is now followed  by Dr. Chilton Si for hypertension and is on carvedilol and losartan.  He last saw Dr. Duke Salvia in March 2024 and more recently was evaluated by Gillian Shields, NP on September 18, 2022.  He had undergone an echo Doppler study on September 11, 2022 showed an EF of 60 to 65%, grade 1 diastolic dysfunction, normal RV function, and mild dilation of his aortic root and ascending aorta.  From a sleep perspective, I have not seen him since 2019.  He has not had any upgrades in his supplies since COVID.  Recently, he has had malfunction of his old CPAP unit which he brought with him to the office today for evaluation.  Apparently his unit is from 2015 and is set at a pressure range of 4 to 20 cm.  He is in need to be set up with a new DME company and obtain a new machine.  He had been compliant with the machine until recently when it has begun to malfunction and not work.  He presents for sleep  consultation and further evaluation.   Past Medical History:  Diagnosis Date   Achilles tendonitis, bilateral 2016   [name], DPM   Acute medial meniscus tear of right knee    Acute myocarditis 2015   viral vs d/t strep infection, resolved w/o sequela   Allergy    Anxiety    Arthritis    Ascending aortic aneurysm (HCC) 11/02/2021   Clotting disorder (HCC)    Deformity of metatarsal    Bilateral, mild DJD at the right 1st MTP joint and bilateral decrease in calcaneal inclination angle   DVT (deep venous thrombosis) (HCC)    GERD (gastroesophageal reflux disease)    Hemorrhoids    hx of   Hepatitis B infection    hx of dx 2005 aprox   Hyperglycemia 02/22/2015   Hypertension    Neck pain    local injections x 2, las summer 2011 (Dr.Vortek)   Plantar fasciitis, bilateral 2016   [name], DPM   Sleep apnea    on CPAP     Past Surgical History:  Procedure Laterality Date   INGUINAL HERNIA REPAIR     KNEE ARTHROSCOPY  6-15   Dr Dion Saucier   KNEE ARTHROSCOPY WITH MEDIAL MENISECTOMY Right 09/26/2018   LEFT HEART CATHETERIZATION WITH CORONARY ANGIOGRAM N/A 12/29/2013   Procedure: LEFT HEART CATHETERIZATION WITH CORONARY ANGIOGRAM;  Surgeon: Wendall Stade, MD;  Location: [facility] CATH LAB;  Service: Cardiovascular;  Laterality: N/A;    Allergies  Allergen Reactions   Amoxicillin Rash   Vancomycin Itching    Pt developed itching while receiving Vancomycin IV. Itching subsided once antibiotic was complete. No interventions needed. AV, RN 06/07/21    Current Outpatient Medications  Medication Sig Dispense Refill   aspirin EC 81 MG tablet Take 81 mg by mouth daily. Swallow whole.     carvedilol (COREG) 12.5 MG tablet TAKE 1 TABLET(12.5 MG) BY MOUTH TWICE DAILY 180 tablet 1   cetirizine (ZYRTEC) 10 MG chewable tablet Chew 10 mg by mouth daily.     losartan (COZAAR) 50 MG tablet Take 1 tablet (50 mg total) by mouth daily. 90 tablet 0   OVER THE COUNTER MEDICATION Place 1-2  drops into both eyes as needed (for dry eyes). "Shellia Cleverly & Lomb for Dry Eyes"     polyethylene glycol (MIRALAX / GLYCOLAX) packet Take 17 g by mouth daily.     psyllium (METAMUCIL) 58.6 % powder Take 1 packet by mouth  as needed (for constipation). Reported on 05/07/2015     valACYclovir (VALTREX) 1000 MG tablet TAKE 2 TABLETS BY MOUTH TWICE DAILY AS NEEDED FOR OUTBREAK 30 tablet 6   Cholecalciferol (VITAMIN D) 50 MCG (2000 UT) tablet Take 2,000 Units by mouth daily. (Patient not taking: Reported on 12/06/2022)     cyproheptadine (PERIACTIN) 4 MG tablet Take 1 tablet (4 mg total) by mouth 3 (three) times daily. Prn itching (Patient not taking: Reported on 12/06/2022) 30 tablet 0   Probiotic Product (PROBIOTIC DAILY PO) Take by mouth. (Patient not taking: Reported on 12/06/2022)     No current facility-administered medications for this visit.    Social History   Socioeconomic History   Marital status: Single    Spouse name: Not on file   Number of children: 0   Years of education: Not on file   Highest education level: Not on file  Occupational History   Occupation: Principal    Employer: [employer]  Tobacco Use   Smoking status: Never   Smokeless tobacco: Never  Vaping Use   Vaping status: Never Used  Substance and Sexual Activity   Alcohol use: Yes    Comment: Occasional beer   Drug use: No   Sexual activity: Not on file  Other Topics Concern   Not on file  Social History Narrative    Lives by himself   Social Determinants of Health   Financial Resource Strain: Not on file  Food Insecurity: Not on file  Transportation Needs: Not on file  Physical Activity: Not on file  Stress: Not on file  Social Connections: Not on file  Intimate Partner Violence: Not on file   Additional social history is notable that he graduated from [employer].  He receives in his advanced degree from [employer].  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  Has begun to start walking on a track for 40 to 60 minutes 2 to 3 days/week.  He drinks occasional beer on weekends socially.  Family History  Problem Relation Age of Onset   Hypertension Mother    Stroke Mother        Judie Petit, GF   Hypertension Father    Kidney Stones Father    Prostate cancer Father 73   Pancreatic cancer Paternal Aunt    Heart attack Maternal Grandmother    Coronary artery disease Maternal Grandmother    Hypertension Maternal Grandmother    Prostate cancer Maternal Grandfather 16   Breast cancer Paternal Grandmother    Diabetes Paternal Grandfather    Colon cancer Neg Hx    Esophageal cancer Neg Hx    Kidney disease Neg Hx    Liver cancer Neg Hx    Rectal cancer Neg Hx    Stomach cancer Neg Hx    His mother is 19 and has hypertension, hyperlipidemia and diabetes.  Father is 33 and has a history of prostate cancer, diabetes and hypertension.  He has a sister age 74 who is alive and well.  Grandmother died with a heart attack and grandfather had prostate cancer.  His paternal grandmother had breast cancer and asthma and paternal grandfather had COPD and hypertension.   ROS General: Negative; No fevers, chills, or night sweats HEENT: Negative; No changes in vision or hearing, sinus congestion, difficulty swallowing Pulmonary: Negative; No cough, wheezing, shortness of breath, hemoptysis Cardiovascular: Positive for hypertension on losartan GI: Negative; No nausea, vomiting, diarrhea, or abdominal pain  GU: Negative; No dysuria, hematuria, or difficulty voiding Musculoskeletal: Negative; no myalgias, joint pain, or weakness Hematologic: Negative; no easy bruising, bleeding Endocrine: Negative; no heat/cold intolerance Neuro: Negative; no changes in balance, headaches Skin: Negative; No rashes or skin lesions Psychiatric: Negative; No behavioral problems, depression Sleep: OSA on CPAP therapy since 2016.  Initial CPAP set up date May 18, 2014,  AirSense 10 AutoSet unit.  Physical Exam BP 134/82   Pulse (!) 57   Ht 5\' 11"  (1.803 m)   Wt 202 lb 6.4 oz (91.8 kg)   SpO2 100%   BMI 28.23 kg/m    Repeat blood pressure by me was 124/80  Wt Readings from Last 3 Encounters:  12/06/22 202 lb 6.4 oz (91.8 kg)  09/18/22 212 lb (96.2 kg)  09/14/22 205 lb (93 kg)   General: Alert, oriented, no distress.  Skin: normal turgor, no rashes, warm and dry HEENT: Normocephalic, atraumatic. Pupils equal round and reactive to light; sclera anicteric; extraocular muscles intact;  Nose without nasal septal hypertrophy Mouth/Parynx benign; Mallinpatti scale 3 Neck: No JVD, no carotid bruits; normal carotid upstroke Lungs: clear to ausculatation and percussion; no wheezing or rales Chest wall: without tenderness to palpitation Heart: PMI not displaced, RRR, s1 s2 normal, 1/6 systolic murmur, no diastolic murmur, no rubs, gallops, thrills, or heaves Abdomen: soft, nontender; no hepatosplenomehaly, BS+; abdominal aorta nontender and not dilated by palpation. Back: no CVA tenderness Pulses 2+ Musculoskeletal: full range of motion, normal strength, no joint deformities Extremities: no clubbing cyanosis or edema, Homan's sign negative  Neurologic: grossly nonfocal; Cranial nerves grossly wnl Psychologic: Normal mood and affect    LABS:     Latest Ref Rng & Units 09/20/2022    2:27 PM 09/18/2022   10:40 AM 09/04/2022   10:36 AM  BMP  Glucose 70 - 99 mg/dL 161  096  045   BUN 6 - 24 mg/dL 11  12  10    Creatinine 0.76 - 1.27 mg/dL 4.09  8.11  9.14   BUN/Creat Ratio 9 - 20 10  10     Sodium 134 - 144 mmol/L 139  140  135   Potassium 3.5 - 5.2 mmol/L 4.3  5.9  4.3   Chloride 96 - 106 mmol/L 100  101  102   CO2 20 - 29 mmol/L 26  27  28    Calcium 8.7 - 10.2 mg/dL 9.3  9.5  8.6         Latest Ref Rng & Units 09/20/2022    2:27 PM 09/18/2022   10:40 AM 09/04/2022   10:36 AM  Hepatic Function  Total Protein 6.0 - 8.5 g/dL 6.9  7.1  6.8    Albumin 3.8 - 4.9 g/dL 4.1  4.1  3.5   AST 0 - 40 IU/L 17  21  19    ALT 0 - 44 IU/L 19  22  18    Alk Phosphatase 44 - 121 IU/L 76  74  49   Total Bilirubin 0.0 - 1.2 mg/dL 0.7  0.5  1.0         Latest Ref Rng & Units 09/04/2022   10:36 AM 12/02/2021    9:02 AM 01/06/2021    8:52 AM  CBC  WBC 4.0 - 10.5 K/uL 5.1  4.6  4.2   Hemoglobin 13.0 - 17.0 g/dL 78.2  95.6  21.3   Hematocrit 39.0 - 52.0 % 41.2  40.6  41.6   Platelets 150 - 400 K/uL 236  218  252.0      Lipid Panel     Component Value Date/Time   CHOL 193 09/20/2022 1427   TRIG 55 09/20/2022 1427   HDL 48 09/20/2022 1427   CHOLHDL 4.0 09/20/2022 1427   CHOLHDL 4 01/10/2022 1100   VLDL 9.4 01/10/2022 1100   LDLCALC 135 (H) 09/20/2022 1427   LDLDIRECT 135.8 03/29/2011 1043     RADIOLOGY: No results found.  IMPRESSION: 1. OSA on CPAP   2. Primary hypertension   3. Aneurysm of ascending aorta without rupture (HCC)   4. History of myocarditis   5. Prediabetes   6. Pure hypercholesterolemia    ASSESSMENT AND PLAN: Mr. Ladale Sherburn is a 57 year-old African-American male who has a history of hypertension, remote history of myocarditis, hyperlipidemia, and in 2016 was diagnosed with moderate overall obstructive sleep apnea with an AHI of 21.5 but sleep apnea was very severe during REM sleep with an AHI of 63.8/h.  He received a ResMed AirSense 10 AutoSet unit on May 18, 2014 and used a Respironics DreamWear nasal mask.  I initially saw him following his sleep study in 2016 and reevaluated him over 3 years later.  I have not seen him since 2019.  He has continued to use the same AirSense 10 CPAP machine which now over the past several months has begun to malfunction.  There is some days he cannot use the machine because it is not working.  He has [employer].  His previous DME company had been [facility] and we need him to be referred to a new DME company to receive a new ResMed AirSense 11 AutoSet unit.   Presently, he goes to bed around midnight and wakes up at 7 AM.  I interrogated his old machine today which apparently is set at a pressure range of 4-20.  I recommend his new machine to be set at a pressure range at 7 to 18 cm with an EPR of 3.  Based on his insurance he will be set up with a new DME company.  I again reiterated the importance of treatment of obstructive sleep apnea particularly with reference to his cardiovascular health.  He has a history of hypertension.  I discussed potential adverse cardiovascular consequences of untreated sleep apnea including its effect on blood pressure control, development of nocturnal arrhythmias including increased risk for atrial fibrillation.  In addition I discussed its negative effects on insulin resistance, increased inflammatory markers and increased risk for nocturnal GERD.  I discussed the potential for nocturnal hypoxemia contributing to ischemia if underlying coronary or cerebrovascular disease is present.  I also discussed the pathophysiology associated with nocturia.    His blood pressure today was stable and a repeat by me was 124/80 on losartan 50 mg and carvedilol 12.5 mg twice a day.Marland Kitchen  He sees Dr. Drue Novel for his primary care.  He may need initiation of lipid-lowering therapy particularly with LDL cholesterol in July 2020 for elevated at 135.  Once he receives his new ResMed AirSense 11 auto CPAP device, I will need to see him within 90 days for compliance assessment.  I have recommended he be supplied with a ResMed AirFit N30i mask.   Lennette Bihari, MD, [facility], ABSM Diplomate, [place] Board [employer] Sleep Medicine  12/09/2022 4:22 PM

## 2022-12-06 NOTE — Patient Instructions (Signed)
Medication Instructions:  No medication changes *If you need a refill on your cardiac medications before your next appointment, please call your pharmacy*   Lab Work: No labs ordered If you have labs (blood work) drawn today and your tests are completely normal, you will receive your results only by: MyChart Message (if you have MyChart) OR A paper copy in the mail If you have any lab test that is abnormal or we need to change your treatment, we will call you to review the results.    Follow-Up: At [facility], you and your health needs are our priority.  As part of our continuing mission to provide you with exceptional heart care, we have created designated Provider Care Teams.  These Care Teams include your primary Cardiologist (physician) and Advanced Practice Providers (APPs -  Physician Assistants and Nurse Practitioners) who all work together to provide you with the care you need, when you need it.  We recommend signing up for the patient portal called "MyChart".  Sign up information is provided on this After Visit Summary.  MyChart is used to connect with patients for Virtual Visits (Telemedicine).  Patients are able to view lab/test results, encounter notes, upcoming appointments, etc.  Non-urgent messages can be sent to your provider as well.   To learn more about what you can do with MyChart, go to [email or web address].    Your next appointment:   90day(s) for sleep compliance  Provider:   Nicki Guadalajara MD

## 2022-12-09 ENCOUNTER — Encounter: Payer: Self-pay | Admitting: Cardiovascular Disease

## 2023-01-02 NOTE — Addendum Note (Signed)
Addended by: Brunetta Genera on: 01/02/2023 01:53 PM   Modules accepted: Orders

## 2023-01-19 ENCOUNTER — Ambulatory Visit (HOSPITAL_BASED_OUTPATIENT_CLINIC_OR_DEPARTMENT_OTHER): Payer: BC Managed Care – PPO | Admitting: Family

## 2023-01-19 ENCOUNTER — Encounter (HOSPITAL_BASED_OUTPATIENT_CLINIC_OR_DEPARTMENT_OTHER): Payer: Self-pay | Admitting: Family

## 2023-01-19 VITALS — BP 132/70 | HR 73 | Ht 71.0 in | Wt 207.9 lb

## 2023-01-19 DIAGNOSIS — I10 Essential (primary) hypertension: Secondary | ICD-10-CM

## 2023-01-19 DIAGNOSIS — R7303 Prediabetes: Secondary | ICD-10-CM | POA: Diagnosis not present

## 2023-01-19 DIAGNOSIS — E782 Mixed hyperlipidemia: Secondary | ICD-10-CM

## 2023-01-19 NOTE — Progress Notes (Signed)
Cardiology Office Note:  .   Date:  01/19/2023  ID:  Gregory Klein, DOB 1965/03/30, MRN [id number] PCP: [name], MD  [facility] Providers Cardiologist:  Chilton Si, MD    History of Present Illness: .   Gregory Klein is a 57 y.o. male  with history of acute myocarditis, DVT, hypertension, prediabetes, GERD, sleep apnea on CPAP.     Prior echocardiogram 2015 LVEF 50 to 55%, gr1dd, aortic root 4.0cm. Prior calcium score 09/20/2021 of 0.  There was aneurysmal dilation of ascending thoracic aorta 4.8 cm recommended for annual follow-up.    Initially diagnosed with hypertension in his 106s.  At visit 10/2021 he was started on carvedilol.  At visit 11/2021 BP 130s over 80s recommend to increase dose losartan but he declined.  Last seen 05/16/2022 with plan to increase physical activity and adjust diet for weight loss, or reduction in A1c.   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 Weight down 5 lbs over last 4 months. Weight as low as 202 lbs recently. Was exercising regularly at [facility] walking and in pool until one week ago, plans to resume. Was eating more salad but more convenience foods recently, plans to adjust diet.   Reports no shortness of breath nor dyspnea on exertion. Reports no chest pain, pressure, or tightness. No edema, orthopnea, PND. Reports no palpitations.      ROS: Please see the history of present illness.    All other systems reviewed and are negative.   Studies Reviewed: .        Cardiac Studies & Procedures       ECHOCARDIOGRAM  ECHOCARDIOGRAM COMPLETE 09/11/2022  Narrative ECHOCARDIOGRAM REPORT    Patient Name:   Gregory Klein Date of Exam: 09/11/2022 Medical Rec #:  [id number]     Height:       71.0 in Accession #:    [id number]    Weight:       205.0 lb Date of Birth:   [birth year]     BSA:          2.131 m Patient Age:    57 years      BP:           126/68 mmHg Patient Gender: M             HR:           61 bpm. 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 CHF; Risk Factors:Hypertension, Sleep Apnea, Family History of Coronary Artery Disease, Dyslipidemia and Non-Smoker. Patient denies chest pain, SOB and leg edema.  Sonographer:    Carlos American RVT, RDCS (AE), RDMS Referring Phys: [phone] [facility] [place]   Sonographer Comments: Global longitudinal strain was attempted. IMPRESSIONS   1. Left ventricular ejection fraction, by estimation, is 60 to 65%. Left ventricular ejection fraction by 3D volume is 60 %. The left ventricle has normal function. The left ventricle has no regional wall motion abnormalities. Left ventricular diastolic parameters are consistent with Grade I diastolic dysfunction (impaired relaxation). 2. Right ventricular systolic function is normal. The right ventricular size is normal. There is  normal pulmonary artery systolic pressure. The estimated right ventricular systolic pressure is 26.5 mmHg. 3. The mitral valve is abnormal. Trivial mitral valve regurgitation. 4. The aortic valve is tricuspid. Aortic valve regurgitation is not visualized. 5. Aortic dilatation noted. There is mild dilatation of the aortic root, measuring 40 mm. There is mild dilatation of the ascending aorta, measuring 41 mm. 6. The inferior vena cava is normal in size with <50% respiratory variability, suggesting right atrial pressure of 8 mmHg.  Comparison(s): Changes from prior study are noted. 02/17/2014: LVEF 50%, AOR 40mm.  FINDINGS Left Ventricle: Left ventricular ejection fraction, by estimation, is 60 to 65%. Left ventricular ejection fraction by 3D volume is  60 %. The left ventricle has normal function. The left ventricle has no regional wall motion abnormalities. The left ventricular internal cavity size was normal in size. There is no left ventricular hypertrophy. Left ventricular diastolic parameters are consistent with Grade I diastolic dysfunction (impaired relaxation). Indeterminate filling pressures.  Right Ventricle: The right ventricular size is normal. No increase in right ventricular wall thickness. Right ventricular systolic function is normal. There is normal pulmonary artery systolic pressure. The tricuspid regurgitant velocity is 2.15 m/s, and with an assumed right atrial pressure of 8 mmHg, the estimated right ventricular systolic pressure is 26.5 mmHg.  Left Atrium: Left atrial size was normal in size.  Right Atrium: Right atrial size was normal in size.  Pericardium: There is no evidence of pericardial effusion.  Mitral Valve: The mitral valve is abnormal. There is mild thickening of the anterior and posterior mitral valve leaflet(s). Trivial mitral valve regurgitation.  Tricuspid Valve: The tricuspid valve is grossly normal. Tricuspid valve regurgitation is mild.  Aortic Valve: The aortic valve is tricuspid. Aortic valve regurgitation is not visualized. Aortic valve mean gradient measures 2.0 mmHg. Aortic valve peak gradient measures 4.7 mmHg. Aortic valve area, by VTI measures 3.68 cm.  Pulmonic Valve: The pulmonic valve was grossly normal. Pulmonic valve regurgitation is mild.  Aorta: Aortic dilatation noted. There is mild dilatation of the aortic root, measuring 40 mm. There is mild dilatation of the ascending aorta, measuring 41 mm.  Venous: The inferior vena cava is normal in size with less than 50% respiratory variability, suggesting right atrial pressure of 8 mmHg.  IAS/Shunts: No atrial level shunt detected by color flow Doppler.   LEFT VENTRICLE PLAX 2D LVIDd:         4.36 cm         Diastology LVIDs:          2.81 cm         LV e' medial:    6.47 cm/s LV PW:         1.04 cm         LV E/e' medial:  10.3 LV IVS:        1.10 cm         LV e' lateral:   6.41 cm/s LVOT diam:     2.40 cm         LV E/e' lateral: 10.4 LV SV:         81 LV SV Index:   38 LVOT Area:     4.52 cm        3D Volume EF LV 3D EF:    Left ventricul LV Volumes (MOD)                            ar LV  vol d, MOD    85.2 ml                    ejection A2C:                                        fraction LV vol d, MOD    84.4 ml                    by 3D A4C:                                        volume is LV vol s, MOD    26.5 ml                    60 %. A2C: LV vol s, MOD    35.8 ml A4C:                           3D Volume EF: LV SV MOD A2C:   58.7 ml       3D EF:        60 % LV SV MOD A4C:   84.4 ml       LV EDV:       97 ml LV SV MOD BP:    52.0 ml       LV ESV:       39 ml LV SV:        58 ml  RIGHT VENTRICLE RV S prime:     14.10 cm/s TAPSE (M-mode): 2.5 cm  LEFT ATRIUM             Index        RIGHT ATRIUM           Index LA diam:        2.70 cm 1.27 cm/m   RA Area:     19.20 cm LA Vol (A2C):   56.2 ml 26.38 ml/m  RA Volume:   57.90 ml  27.17 ml/m LA Vol (A4C):   45.3 ml 21.26 ml/m LA Biplane Vol: 54.3 ml 25.48 ml/m AORTIC VALVE                    PULMONIC VALVE AV Area (Vmax):    3.82 cm     PV Vmax:          0.74 m/s AV Area (Vmean):   4.09 cm     PV Peak grad:     2.2 mmHg AV Area (VTI):     3.68 cm     PR End Diast Vel: 3.82 msec AV Vmax:           108.00 cm/s AV Vmean:          69.300 cm/s AV VTI:            0.221 m AV Peak Grad:      4.7 mmHg AV Mean Grad:      2.0 mmHg LVOT Vmax:         91.30 cm/s LVOT Vmean:        62.700 cm/s LVOT VTI:          0.180 m LVOT/AV VTI ratio: 0.81  AORTA Ao Root diam:  4.00 cm Ao Asc diam:  4.10 cm Ao Arch diam: 3.7 cm  MITRAL VALVE               TRICUSPID VALVE MV Area (PHT): 2.72 cm    TR Peak grad:   18.5 mmHg MV Decel Time: 279 msec    TR Vmax:         215.00 cm/s MV E velocity: 66.50 cm/s MV A velocity: 78.10 cm/s  SHUNTS MV E/A ratio:  0.85        Systemic VTI:  0.18 m Systemic Diam: 2.40 cm  Zoila Shutter MD Electronically signed by Zoila Shutter MD Signature Date/Time: 09/11/2022/3:50:46 PM    Final     CT SCANS  CT CARDIAC SCORING (SELF PAY ONLY) 09/20/2021  Addendum 09/20/2021  4:09 PM ADDENDUM REPORT: 09/20/2021 16:07  EXAM: OVER-READ INTERPRETATION  CT CHEST  The following report is an over-read performed by radiologist Dr. Karle Barr [facility] on 09/20/2021. This over-read does not include interpretation of cardiac or coronary anatomy or pathology. The coronary artery calcium score interpretation by the cardiologist is attached.  COMPARISON:  None.  FINDINGS: Aneurysmal dilatation ascending thoracic aorta 4.0 cm transverse image 10. Heart size upper normal. No pericardial effusion.  Esophagus unremarkable. Visualized upper abdomen unremarkable. No thoracic adenopathy.  5 mm focus of nodular thickening at RIGHT major fissure image 12. 5 mm focus of nodular pleural thickening LEFT major fissure image 14. Minimal subsegmental atelectasis LEFT lower lobe. Remaining lungs clear. No acute osseous findings.  IMPRESSION: Aneurysmal dilatation ascending thoracic aorta 4.0 cm transverse; Recommend annual imaging followup by CTA or MRA. This recommendation follows 2010 ACCF/AHA/AATS/ACR/ASA/SCA/SCAI/SIR/STS/SVM Guidelines for the Diagnosis and Management of Patients with Thoracic Aortic Disease. Circulation. 2010; 121: Z610-R604. Aortic aneurysm NOS (ICD10-I71.9)  Nodular foci of pleural thickening at the major fissures bilaterally; no routine follow-up recommended.   Electronically Signed By: Ulyses Southward M.D. On: 09/20/2021 16:07  Narrative CLINICAL DATA:  Cardiovascular Disease Risk stratification  EXAM: Coronary Calcium Score  TECHNIQUE: A gated, non-contrast computed tomography  scan of the heart was performed using 3mm slice thickness. Axial images were analyzed on a dedicated workstation. Calcium scoring of the coronary arteries was performed using the Agatston method.  MEDICATIONS: MEDICATIONS None  FINDINGS: Coronary arteries: Normal origins.  Coronary Calcium Score:  Left main: 0  Left anterior descending artery: 0  Left circumflex artery: 0  Right coronary artery: 0  Total: 0  Pericardium: Normal.  Ascending Aorta: Mildly dilated, 4.0 cm.  Non-cardiac: See separate report from [facility].  IMPRESSION: Coronary calcium score of 0 Agatston units. This suggests low risk for future cardiac events.  RECOMMENDATIONS: Coronary artery calcium (CAC) score is a strong predictor of incident coronary heart disease (CHD) and provides predictive information beyond traditional risk factors. CAC scoring is reasonable to use in the decision to withhold, postpone, or initiate statin therapy in intermediate-risk or selected borderline-risk asymptomatic adults (age 45-75 years and LDL-C >=70 to <190 mg/dL) who do not have diabetes or established atherosclerotic cardiovascular disease (ASCVD).* In intermediate-risk (10-year ASCVD risk >=7.5% to <20%) adults or selected borderline-risk (10-year ASCVD risk >=5% to <7.5%) adults in whom a CAC score is measured for the purpose of making a treatment decision the following recommendations have been made:  If CAC=0, it is reasonable to withhold statin therapy and reassess in 5 to 10 years, as long as higher risk conditions are absent (diabetes mellitus, family history of premature CHD in first degree relatives (  males <55 years; females <65 years), cigarette smoking, or LDL >=190 mg/dL).  If CAC is 1 to 99, it is reasonable to initiate statin therapy for patients >=50 years of age.  If CAC is >=100 or >=75th percentile, it is reasonable to initiate statin therapy at any age.  Cardiology  referral should be considered for patients with CAC scores >=400 or >=75th percentile.  *2018 AHA/ACC/AACVPR/AAPA/ABC/ACPM/ADA/AGS/APhA/ASPC/NLA/PCNA Guideline on the Management of Blood Cholesterol: A Report of the American College of Cardiology/American Heart Association Task Force on Clinical Practice Guidelines. J Am Coll Cardiol. 2019;73(24):3168-3209.  Marca Ancona, MD  Electronically Signed: By: Marca Ancona M.D. On: 09/20/2021 15:12   CARDIAC MRI  MR CARDIAC MORPHOLOGY W WO CONTRAST 12/29/2013  Narrative CLINICAL DATA:  Myocarditis  EXAM: CARDIAC MRI  TECHNIQUE: The patient was scanned on a 1.5 Tesla GE magnet. A dedicated cardiac coil was used. Functional imaging was done using Fiesta sequences. 2,3, and 4 chamber views were done to assess for RWMA's. Modified Simpson's rule using a short axis stack was used to calculate an ejection fraction on a dedicated work [employer]. The patient received 30 cc of Multihance. After 10 minutes inversion recovery sequences were used to assess for infiltration and scar tissue.  CONTRAST:  30 cc Multihance  FINDINGS: There was mild LVE. No HOCM or LVH. The ascending aorta was normal. The LA/RA/RV were normal in size and function. The mitral, tricuspid and aortic valves were normal There was no ASD/VSD and no significant pericardial effusion. The quantitative EF was 49% with distal septal and apical hypokinesis. Delayed enhancement images showed spotty uptake in the inferobasal myocardium, anterior apical myocardium and pericardium  IMPRESSION: 1) Mild LVE EF 49%  2) Normal cardiac valves  3) No pericardial effusion  4) Delayed gadolinium uptake in the inferobasal wall, apical myocardium and pericardium In the setting of elevated troponin, ECG changes and no hemodynamically significant lesions on cath this would be consistent with myocarditis  Charlton Haws   Electronically Signed By: Charlton Haws M.D. On: 12/29/2013 17:57         Risk Assessment/Calculations:     The 10-year ASCVD risk score (Arnett DK, et al., 2019) is: 11.6%   Values used to calculate the score:     Age: 65 years     Sex: Male     Is Non-Hispanic African American: Yes     Diabetic: No     Tobacco smoker: No     Systolic Blood Pressure: 132 mmHg     Is BP treated: Yes     HDL Cholesterol: 48 mg/dL     Total Cholesterol: 193 mg/dL          Physical Exam:   VS:  BP 132/70   Pulse 73   Ht 5\' 11"  (1.803 m)   Wt 207 lb 14.4 oz (94.3 kg)   SpO2 95%   BMI 29.00 kg/m    Wt Readings from Last 3 Encounters:  01/19/23 207 lb 14.4 oz (94.3 kg)  12/06/22 202 lb 6.4 oz (91.8 kg)  09/18/22 212 lb (96.2 kg)    GEN: Well nourished, well developed in no acute distress NECK: No JVD; No carotid bruits CARDIAC: RRR, no murmurs, rubs, gallops RESPIRATORY:  Clear to auscultation without rales, wheezing or rhonchi  ABDOMEN: Soft, non-tender, non-distended EXTREMITIES:  No edema; No deformity   ASSESSMENT AND PLAN: .    HTN - BP reasonably controlled. He will begin to monitor at home and  report if consistently >130/80. Continue Coreg 12.5mg  BID, losartan 50mg  every day. If BP not at goal, plan to increase Losartan.    Ascending aortic aneurysm -08/2021 CT 4.0cm. 08/2022 echo 4.1 cm. Repeat echo 1 year for monitoring. Coordinate echo at follow up. BP control, as above. Continue beta blocker.    OSA - CPAP compliance encouraged. Notes some dryness in the morning, plans to discuss with sleep team.    HLD - Update lipid panel, CMP to assess need for statin vs continued lifestyle changes. LDL goal <100.    Obesity / Prediabetes - Weight loss via diet and exercise encouraged. Discussed the impact being overweight would have on cardiovascular risk. Update A1c. Was exercising more at [employer] up until a week ago.Marland Kitchen             Dispo: follow up in 6 months  Signed, Alver Sorrow, NP

## 2023-01-19 NOTE — Patient Instructions (Signed)
Medication Instructions:  Your physician recommends that you continue on your current medications as directed. Please refer to the Current Medication list given to you today.  *If you need a refill on your cardiac medications before your next appointment, please call your pharmacy*  Lab Work: A1C & LIPID today  If you have labs (blood work) drawn today and your tests are completely normal, you will receive your results only by: MyChart Message (if you have MyChart) OR A paper copy in the mail If you have any lab test that is abnormal or we need to change your treatment, we will call you to review the results.   Follow-Up: At [facility], you and your health needs are our priority.  As part of our continuing mission to provide you with exceptional heart care, we have created designated Provider Care Teams.  These Care Teams include your primary Cardiologist (physician) and Advanced Practice Providers (APPs -  Physician Assistants and Nurse Practitioners) who all work together to provide you with the care you need, when you need it.  We recommend signing up for the patient portal called "MyChart".  Sign up information is provided on this After Visit Summary.  MyChart is used to connect with patients for Virtual Visits (Telemedicine).  Patients are able to view lab/test results, encounter notes, upcoming appointments, etc.  Non-urgent messages can be sent to your provider as well.   To learn more about what you can do with MyChart, go to [email or web address].    Your next appointment:   6 month(s)  Provider:   Chilton Si, MD or Gillian Shields, NP

## 2023-01-20 ENCOUNTER — Other Ambulatory Visit: Payer: Self-pay | Admitting: Internal Medicine

## 2023-01-20 LAB — LIPID PANEL
Chol/HDL Ratio: 3.5 ratio (ref 0.0–5.0)
Cholesterol, Total: 191 mg/dL (ref 100–199)
HDL: 54 mg/dL (ref >39)
LDL Chol Calc (NIH): 129 mg/dL — ABNORMAL HIGH (ref 0–99)
Triglycerides: 42 mg/dL (ref 0–149)
VLDL Cholesterol Cal: 8 mg/dL (ref 5–40)

## 2023-01-20 LAB — HEMOGLOBIN A1C
Est. average glucose Bld gHb Est-mCnc: 128 mg/dL
Hgb A1c MFr Bld: 6.1 % — ABNORMAL HIGH (ref 4.8–5.6)

## 2023-01-22 ENCOUNTER — Telehealth (HOSPITAL_BASED_OUTPATIENT_CLINIC_OR_DEPARTMENT_OTHER): Payer: Self-pay

## 2023-01-22 DIAGNOSIS — E782 Mixed hyperlipidemia: Secondary | ICD-10-CM

## 2023-01-22 MED ORDER — ROSUVASTATIN CALCIUM 10 MG PO TABS: 10.0000 mg | ORAL_TABLET | ORAL | 1 refills | Status: DC

## 2023-01-22 NOTE — Telephone Encounter (Signed)
Called patient and discussed results and recommendations. He verbalized understanding. Rx sent to pharmacy and lab orders mailed to patient.  ----- Message from Alver Sorrow sent at 01/22/2023 10:58 AM EST ----- A1c the same as previous in prediabetic range. Cholesterol shows improvement. LDL was previously 135 now 129 with goal being <100. The 10-year ASCVD risk score (Arnett DK, et al., 2019) is: 11.2% which is intermediate risk. Guidelines would recommend addition of statin - could start small with Rosuvastatin 10mg  three times per week. Alternatively, could continue lifestyle changes for 6 months and repeat FLP/LFT a week prior to 6 months f/u.

## 2023-01-22 NOTE — Telephone Encounter (Signed)
-----   Message from Alver Sorrow sent at 01/22/2023 10:58 AM EST ----- A1c the same as previous in prediabetic range. Cholesterol shows improvement. LDL was previously 135 now 129 with goal being <100. The 10-year ASCVD risk score (Arnett DK, et al., 2019) is: 11.2% which is intermediate risk. Guidelines would recommend addition of statin - could start small with Rosuvastatin 10mg  three times per week. Alternatively, could continue lifestyle changes for 6 months and repeat FLP/LFT a week prior to 6 months f/u.

## 2023-02-11 ENCOUNTER — Other Ambulatory Visit: Payer: Self-pay | Admitting: Internal Medicine

## 2023-04-17 ENCOUNTER — Other Ambulatory Visit: Payer: Self-pay | Admitting: Internal Medicine

## 2023-04-17 ENCOUNTER — Telehealth: Payer: Self-pay | Admitting: Internal Medicine

## 2023-04-17 NOTE — Telephone Encounter (Signed)
error 

## 2023-04-18 ENCOUNTER — Telehealth (INDEPENDENT_AMBULATORY_CARE_PROVIDER_SITE_OTHER): Payer: 59 | Admitting: Internal Medicine

## 2023-04-18 ENCOUNTER — Encounter: Payer: Self-pay | Admitting: Internal Medicine

## 2023-04-18 VITALS — Ht 71.0 in

## 2023-04-18 DIAGNOSIS — E559 Vitamin D deficiency, unspecified: Secondary | ICD-10-CM | POA: Diagnosis not present

## 2023-04-18 DIAGNOSIS — I10 Essential (primary) hypertension: Secondary | ICD-10-CM | POA: Diagnosis not present

## 2023-04-18 DIAGNOSIS — G4733 Obstructive sleep apnea (adult) (pediatric): Secondary | ICD-10-CM | POA: Diagnosis not present

## 2023-04-18 MED ORDER — LOSARTAN POTASSIUM 50 MG PO TABS: 50.0000 mg | ORAL_TABLET | Freq: Every day | ORAL | 1 refills | Status: DC

## 2023-04-18 NOTE — Progress Notes (Unsigned)
Subjective:    Patient ID: Gregory Klein, male    DOB: [birth year], 58 y.o.   MRN: [id number]  DOS:  04/18/2023 Type of visit - description: Virtual Visit via Video Note  I connected with the above patient  by a video enabled telemedicine application and verified that I am speaking with the correct person using two identifiers.   Location of patient: home  Location of provider: office  Persons participating in the virtual visit: patient, provider   I discussed the limitations of evaluation and management by telemedicine and the availability of in person appointments. The patient expressed understanding and agreed to proceed.  Routine checkup. Feels well. Denies chest pain or difficulty breathing Needs a refill on losartan. From time to times he sees drops of red blood with bowel movements, typically associated with hard stools. Saw cardiology, note reviewed.  BP Readings from Last 3 Encounters:  01/19/23 132/70  12/06/22 134/82  09/18/22 136/84    Review of Systems See above   Past Medical History:  Diagnosis Date   Achilles tendonitis, bilateral 2016   [name], DPM   Acute medial meniscus tear of right knee    Acute myocarditis 2015   viral vs d/t strep infection, resolved w/o sequela   Allergy    Anxiety    Arthritis    Ascending aortic aneurysm (HCC) 11/02/2021   Clotting disorder (HCC)    Deformity of metatarsal    Bilateral, mild DJD at the right 1st MTP joint and bilateral decrease in calcaneal inclination angle   DVT (deep venous thrombosis) (HCC)    GERD (gastroesophageal reflux disease)    Hemorrhoids    hx of   Hepatitis B infection    hx of dx 2005 aprox   Hyperglycemia 02/22/2015   Hypertension    Neck pain    local injections x 2, las summer 2011 (Dr.Vortek)   Plantar fasciitis, bilateral 2016   [name], DPM   Sleep apnea    on CPAP     Past Surgical History:  Procedure Laterality Date   INGUINAL HERNIA REPAIR     KNEE  ARTHROSCOPY  6-15   Dr Dion Saucier   KNEE ARTHROSCOPY WITH MEDIAL MENISECTOMY Right 09/26/2018   LEFT HEART CATHETERIZATION WITH CORONARY ANGIOGRAM N/A 12/29/2013   Procedure: LEFT HEART CATHETERIZATION WITH CORONARY ANGIOGRAM;  Surgeon: Wendall Stade, MD;  Location[facility] C[facility] LAB;  Service: Cardiovascular;  Laterality: N/A;    Current Outpatient Medications  Medication Instructions   aspirin EC 81 mg, Oral, Daily, Swallow whole.    carvedilol (COREG) 12.5 MG tablet TAKE 1 TABLET(12.5 MG) BY MOUTH TWICE DAILY   cetirizine (ZYRTEC) 10 mg, Oral, Daily   cyproheptadine (PERIACTIN) 4 mg, Oral, 3 times daily, Prn itching   losartan (COZAAR) 50 mg, Oral, Daily, Due for CPX   OVER THE COUNTER MEDICATION 1-2 drops, Both Eyes, As needed, "[employer] for Dry Eyes"    polyethylene glycol (MIRALAX / GLYCOLAX) 17 g, Oral, Daily   Probiotic Product (PROBIOTIC DAILY PO) Take by mouth.   psyllium (METAMUCIL) 58.6 % powder 1 packet, Oral, As needed, Reported on 05/07/2015   rosuvastatin (CRESTOR) 10 mg, Oral, 3 times weekly   valACYclovir (VALTREX) 1,000 mg, Oral, 2 times daily PRN   Vitamin D 2,000 Units, Daily       Objective:   Physical Exam Ht 5\' 11"  (1.803 m)   BMI 29.00 kg/m  This is a virtual video visit, alert oriented x 3, in  no distress, has no recent BPs.     Assessment   ASSESSMENT Prediabetes A1C 5.9 (12-2014) HTN OSA on CPAP Vitamin D deficiency --DVT: 09/2018: R leg DVT 3 weeks after R knee arthoscopy. --Post thrombotic syndrome: had R leg edema ~ 09/2019 after a trip, [place]: chronic thrombus R popliteal, rec ASA and compression  stockings x life (see vascular note 10-10-2019) June 2023: Calcium coronary score: 0.  + Ascending aorta dilatation (4.0 cm).  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 Lanny Hurst), occ numbness R thumb,  Injection 01/2019 H/o Urolithiasis ER 04-30-25   PLAN HTN: Recent ambulatory BPs, well-controlled per chart review, currently on carvedilol, losartan.  Plan: Refill losartan. OSA: Good CPAP compliance Vitamin D deficiency: Not taking supplements consistently, encouraged to do. Hyperlipidemia: Saw cardiology November 2024, they recommended LDL goal of 100.  Patient is on rosuvastatin. Hemorrhoids: reports h/o  hemorrhoids, occasional red blood with BMs, recommend fiber rich diet, Colace if needed.  MiraLAX. RTC scheduled for 05/15/2023    I discussed the assessment and treatment plan with the patient. The patient was provided an opportunity to ask questions and all were answered. The patient agreed with the plan and demonstrated an understanding of the instructions.   The patient was advised to call back or seek an in-person evaluation if the symptoms worsen or if the condition fails to improve as anticipated.

## 2023-04-19 DIAGNOSIS — E559 Vitamin D deficiency, unspecified: Secondary | ICD-10-CM | POA: Insufficient documentation

## 2023-04-19 NOTE — Assessment & Plan Note (Signed)
HTN: Recent ambulatory BPs, well-controlled per chart review, currently on carvedilol, losartan.  Plan: Refill losartan. OSA: Good CPAP compliance Vitamin D deficiency: Not taking supplements consistently, encouraged to do. Hyperlipidemia: Saw cardiology November 2024, they recommended LDL goal of 100.  Patient is on rosuvastatin. Hemorrhoids: reports h/o  hemorrhoids, occasional red blood with BMs, recommend fiber rich diet, Colace if needed.  MiraLAX. RTC scheduled for 05/15/2023

## 2023-05-15 ENCOUNTER — Encounter: Payer: Self-pay | Admitting: Internal Medicine

## 2023-05-15 ENCOUNTER — Ambulatory Visit: Payer: Self-pay | Admitting: Internal Medicine

## 2023-05-15 VITALS — BP 126/76 | HR 68 | Temp 98.2°F | Resp 16 | Ht 71.0 in | Wt 208.0 lb

## 2023-05-15 DIAGNOSIS — L723 Sebaceous cyst: Secondary | ICD-10-CM | POA: Diagnosis not present

## 2023-05-15 DIAGNOSIS — L089 Local infection of the skin and subcutaneous tissue, unspecified: Secondary | ICD-10-CM | POA: Diagnosis not present

## 2023-05-15 DIAGNOSIS — E785 Hyperlipidemia, unspecified: Secondary | ICD-10-CM

## 2023-05-15 DIAGNOSIS — I10 Essential (primary) hypertension: Secondary | ICD-10-CM

## 2023-05-15 DIAGNOSIS — Z0001 Encounter for general adult medical examination with abnormal findings: Secondary | ICD-10-CM

## 2023-05-15 DIAGNOSIS — R739 Hyperglycemia, unspecified: Secondary | ICD-10-CM

## 2023-05-15 DIAGNOSIS — Z0000 Encounter for general adult medical examination without abnormal findings: Secondary | ICD-10-CM

## 2023-05-15 MED ORDER — DOXYCYCLINE HYCLATE 100 MG PO TABS: 100.0000 mg | ORAL_TABLET | Freq: Two times a day (BID) | ORAL | 0 refills | Status: DC

## 2023-05-15 NOTE — Patient Instructions (Addendum)
 Vaccines I recommend: Covid booster Flu shot  Wound care: Take showers as usual Clean the area with soap and water Squeeze as much as you can Cover with the Band-Aid. Doxycycline 100 mg twice daily for 10 days If there is no gradually better come back next week. If you have fever, chills or the area get worse let me know.   Check the  blood pressure regularly Blood pressure goal:  between 110/65 and  135/85. If it is consistently higher or lower, let me know     GO TO THE LAB : Get the blood work     Please go to the front desk: Arrange for a follow-up in 4 months

## 2023-05-15 NOTE — Progress Notes (Unsigned)
 Subjective:    Patient ID: Gregory Klein, male    DOB: [birth year], 58 y.o.   MRN: [id number]  DOS:  05/15/2023 Type of visit - description: CPX  Here for CPX. Chronic medical problems addressed. Also few days ago, well-known lump at the right side of the chest got swelling and tender.  Few days ago he noticed an opening with some white drainage with strong odor. No fever or chills.   Review of Systems See above   Past Medical History:  Diagnosis Date   Achilles tendonitis, bilateral 2016   [name], DPM   Acute medial meniscus tear of right knee    Acute myocarditis 2015   viral vs d/t strep infection, resolved w/o sequela   Allergy    Anxiety    Arthritis    Ascending aortic aneurysm (HCC) 11/02/2021   Clotting disorder (HCC)    Deformity of metatarsal    Bilateral, mild DJD at the right 1st MTP joint and bilateral decrease in calcaneal inclination angle   DVT (deep venous thrombosis) (HCC)    GERD (gastroesophageal reflux disease)    Hemorrhoids    hx of   Hepatitis B infection    hx of dx 2005 aprox   Hyperglycemia 02/22/2015   Hypertension    Neck pain    local injections x 2, las summer 2011 (Dr.Vortek)   Plantar fasciitis, bilateral 2016   [name], DPM   Sleep apnea    on CPAP     Past Surgical History:  Procedure Laterality Date   INGUINAL HERNIA REPAIR     KNEE ARTHROSCOPY  6-15   Dr Dion Saucier   KNEE ARTHROSCOPY WITH MEDIAL MENISECTOMY Right 09/26/2018   LEFT HEART CATHETERIZATION WITH CORONARY ANGIOGRAM N/A 12/29/2013   Procedure: LEFT HEART CATHETERIZATION WITH CORONARY ANGIOGRAM;  Surgeon: Wendall Stade, MD;  Location: [facility] CATH LAB;  Service: Cardiovascular;  Laterality: N/A;    Current Outpatient Medications  Medication Instructions   aspirin EC 81 mg, Daily   carvedilol (COREG) 12.5 MG tablet TAKE 1 TABLET(12.5 MG) BY MOUTH TWICE DAILY   cetirizine (ZYRTEC) 10 mg, Daily   cyproheptadine (PERIACTIN) 4 mg, Oral, 3 times daily, Prn  itching   losartan (COZAAR) 50 mg, Oral, Daily   OVER THE COUNTER MEDICATION 1-2 drops, As needed   polyethylene glycol (MIRALAX / GLYCOLAX) 17 g, Daily   psyllium (METAMUCIL) 58.6 % powder 1 packet, As needed   rosuvastatin (CRESTOR) 10 mg, Oral, 3 times weekly   valACYclovir (VALTREX) 1,000 mg, Oral, 2 times daily PRN   Vitamin D 2,000 Units, Daily       Objective:   Physical Exam Chest:       Comments: At the right side of the chest has a 2 x 1 inches induration, has a opening with white discharge already coming out fluctuance in the center. Skin is slightly red.    BP 126/76   Pulse 68   Temp 98.2 F (36.8 C) (Oral)   Resp 16   Ht 5\' 11"  (1.803 m)   Wt 208 lb (94.3 kg)   SpO2 98%   BMI 29.01 kg/m  General: Well developed, NAD, BMI noted Neck: No  thyromegaly  HEENT:  Normocephalic . Face symmetric, atraumatic Lungs:  CTA B Normal respiratory effort, no intercostal retractions, no accessory muscle use. Heart: RRR,  no murmur.  Abdomen:  Not distended, soft, non-tender. No rebound or rigidity.   Lower extremities: no pretibial edema bilaterally  Skin: Exposed  areas without rash. Not pale. Not jaundice Neurologic:  alert & oriented X3.  Speech normal, gait appropriate for age and unassisted Strength symmetric and appropriate for age.  Psych: Cognition and judgment appear intact.  Cooperative with normal attention span and concentration.  Behavior appropriate. No anxious or depressed appearing.     Assessment   ASSESSMENT Prediabetes A1C 5.9 (12-2014) HTN OSA on CPAP Vitamin D deficiency --DVT: 09/2018: R leg DVT 3 weeks after R knee arthoscopy. --Post thrombotic syndrome: had R leg edema ~ 09/2019 after a trip, [place]: chronic thrombus R popliteal, rec ASA and compression  stockings x life (see vascular note 10-10-2019) June 2023: Calcium coronary score: 0.  + Ascending aorta dilatation (4.0 cm).  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 Lanny Hurst), occ numbness R thumb,  Injection 01/2019 H/o Urolithiasis ER 04-30-25   PLAN Here for CPX - Tdap 2019 - s/p shingrix  x 2  - PNA (23): 12/28/13  - Vaccines I recommend: Flu shot every fall, COVID booster if not done in the last few months.  -CCS- 04/17/02;  cscope 07/2017 + polyp, colonoscopy 09/14/2022, next per GI -Prostate cancer screening: F had prost Ca age 36, no symptoms, check PSA. -Diet,exercise: Room for improvement, counseled. -Labs:  CMP FLP CBC A1c PSA - POA d/w pt   HTN: BP looks good, continue present care.  Checking labs Vitamin D deficiency: Not on supplements recommend to do. Hyperlipidemia: Saw cardiology, LDL 100, good compliance with Rosuvastatin.  Checking labs. Infected sebaceous cyst: I&D today. Prescribe antibiotics See AVS.    Procedure note: Area of infected sebaceous cyst was cleaned with peroxide, about 2 mL of 2% lidocaine without was injected in the skin, I lancet the fluctuant area, obtained abundant purulent, fatty material and blood.  + Other.  RTC 4 months ==== HTN: Recent ambulatory BPs, well-controlled per chart review, currently on carvedilol, losartan.  Plan: Refill losartan. OSA: Good CPAP compliance Vitamin D deficiency: Not taking supplements consistently, encouraged to do. Hyperlipidemia: Saw cardiology November 2024, they recommended LDL goal of 100.  Patient is on rosuvastatin. Hemorrhoids: reports h/o  hemorrhoids, occasional red blood with BMs, recommend fiber rich diet, Colace if needed.  MiraLAX. RTC scheduled for 05/15/2023

## 2023-05-16 ENCOUNTER — Encounter: Payer: Self-pay | Admitting: Internal Medicine

## 2023-05-16 LAB — LIPID PANEL
Cholesterol: 146 mg/dL (ref 0–200)
HDL: 48.2 mg/dL (ref >39.00)
LDL Cholesterol: 90 mg/dL (ref 0–99)
NonHDL: 97.89
Total CHOL/HDL Ratio: 3
Triglycerides: 41 mg/dL (ref 0.0–149.0)
VLDL: 8.2 mg/dL (ref 0.0–40.0)

## 2023-05-16 LAB — TSH: TSH: 1.6 u[IU]/mL (ref 0.35–5.50)

## 2023-05-16 LAB — CBC WITH DIFFERENTIAL/PLATELET
Basophils Absolute: 0 10*3/uL (ref 0.0–0.1)
Basophils Relative: 0.5 % (ref 0.0–3.0)
Eosinophils Absolute: 0 10*3/uL (ref 0.0–0.7)
Eosinophils Relative: 0.2 % (ref 0.0–5.0)
HCT: 41.9 % (ref 39.0–52.0)
Hemoglobin: 13.6 g/dL (ref 13.0–17.0)
Lymphocytes Relative: 14.1 % (ref 12.0–46.0)
Lymphs Abs: 1.2 10*3/uL (ref 0.7–4.0)
MCHC: 32.5 g/dL (ref 30.0–36.0)
MCV: 84.9 fl (ref 78.0–100.0)
Monocytes Absolute: 0.7 10*3/uL (ref 0.1–1.0)
Monocytes Relative: 8.5 % (ref 3.0–12.0)
Neutro Abs: 6.4 10*3/uL (ref 1.4–7.7)
Neutrophils Relative %: 76.7 % (ref 43.0–77.0)
Platelets: 265 10*3/uL (ref 150.0–400.0)
RBC: 4.93 Mil/uL (ref 4.22–5.81)
RDW: 15.3 % (ref 11.5–15.5)
WBC: 8.3 10*3/uL (ref 4.0–10.5)

## 2023-05-16 LAB — COMPREHENSIVE METABOLIC PANEL
ALT: 16 U/L (ref 0–53)
AST: 16 U/L (ref 0–37)
Albumin: 4.1 g/dL (ref 3.5–5.2)
Alkaline Phosphatase: 62 U/L (ref 39–117)
BUN: 13 mg/dL (ref 6–23)
CO2: 30 meq/L (ref 19–32)
Calcium: 9.3 mg/dL (ref 8.4–10.5)
Chloride: 105 meq/L (ref 96–112)
Creatinine, Ser: 1.16 mg/dL (ref 0.40–1.50)
GFR: 70.58 mL/min (ref >60.00)
Glucose, Bld: 91 mg/dL (ref 70–99)
Potassium: 5.4 meq/L — ABNORMAL HIGH (ref 3.5–5.1)
Sodium: 141 meq/L (ref 135–145)
Total Bilirubin: 1.1 mg/dL (ref 0.2–1.2)
Total Protein: 7.2 g/dL (ref 6.0–8.3)

## 2023-05-16 LAB — HEMOGLOBIN A1C: Hgb A1c MFr Bld: 6.2 % (ref 4.6–6.5)

## 2023-05-16 NOTE — Assessment & Plan Note (Signed)
 Here for CPX - Tdap 2019 - s/p shingrix  x 2  - PNA (23): 12/28/13  - Vaccines I recommend: Flu shot every fall, COVID booster if not done in the last few months. -CCS- 04/17/02;  cscope 07/2017 + polyp, colonoscopy 09/14/2022, next per GI -Prostate cancer screening: F had prost Ca age 58, no symptoms, check PSA. -Diet,exercise: Room for improvement, counseled. -Labs:  CMP FLP CBC A1c PSA - POA d/w pt

## 2023-05-16 NOTE — Assessment & Plan Note (Signed)
 Here for CPX  Other issues discussed today: HTN: BP looks good, continue present care.  Checking labs Vitamin D deficiency: Not on supplements recommend to do. Hyperlipidemia: Saw cardiology, LDL goal < 100, good compliance with Rosuvastatin.  Checking labs.. Infected sebaceous cyst: I&D today.  Antibiotics prescribed, wound care discussed.  Procedure note: With the patient in the room concerned, the infected sebaceous cyst was cleaned with alcohol swabs, about 2 mL of 2% lidocaine without epi was injected in the skin, I lancet the fluctuant area, obtained abundant purulent, fatty material and blood.  Patient tolerated well.  Area was cleaned and a Band-Aid applied. RTC 4 months

## 2023-05-18 ENCOUNTER — Encounter: Payer: Self-pay | Admitting: Internal Medicine

## 2023-05-19 LAB — WOUND CULTURE
MICRO NUMBER:: 16215303
RESULT:: NO GROWTH
SPECIMEN QUALITY:: ADEQUATE

## 2023-06-01 ENCOUNTER — Encounter: Payer: Self-pay | Admitting: Internal Medicine

## 2023-06-01 DIAGNOSIS — I10 Essential (primary) hypertension: Secondary | ICD-10-CM

## 2023-06-03 ENCOUNTER — Encounter: Payer: Self-pay | Admitting: Internal Medicine

## 2023-06-03 NOTE — Telephone Encounter (Signed)
 BMP in 1 month, Dx HTN

## 2023-06-04 NOTE — Addendum Note (Signed)
 Addended byConrad Carbon Cliff D on: 06/04/2023 07:47 AM   Modules accepted: Orders

## 2023-06-04 NOTE — Telephone Encounter (Signed)
 BMP ordered

## 2023-10-12 ENCOUNTER — Other Ambulatory Visit: Payer: Self-pay | Admitting: Internal Medicine

## 2023-12-23 ENCOUNTER — Other Ambulatory Visit (HOSPITAL_BASED_OUTPATIENT_CLINIC_OR_DEPARTMENT_OTHER): Payer: Self-pay | Admitting: Cardiovascular Disease

## 2024-03-19 ENCOUNTER — Other Ambulatory Visit: Payer: Self-pay | Admitting: Internal Medicine

## 2024-03-21 ENCOUNTER — Other Ambulatory Visit (HOSPITAL_BASED_OUTPATIENT_CLINIC_OR_DEPARTMENT_OTHER): Payer: Self-pay | Admitting: Family

## 2024-03-21 DIAGNOSIS — E782 Mixed hyperlipidemia: Secondary | ICD-10-CM

## 2024-03-25 NOTE — Telephone Encounter (Signed)
 Refilled with note on Rx and to pharmacy needs to be seen for further refills, call the office for appointment

## 2024-03-28 ENCOUNTER — Other Ambulatory Visit (HOSPITAL_BASED_OUTPATIENT_CLINIC_OR_DEPARTMENT_OTHER): Payer: Self-pay | Admitting: Cardiovascular Disease

## 2024-03-31 ENCOUNTER — Other Ambulatory Visit: Payer: Self-pay | Admitting: Cardiovascular Disease

## 2024-04-01 ENCOUNTER — Encounter (HOSPITAL_COMMUNITY): Payer: Self-pay

## 2024-04-01 ENCOUNTER — Inpatient Hospital Stay (HOSPITAL_COMMUNITY): Admission: RE | Admit: 2024-04-01 | Discharge: 2024-04-01

## 2024-04-01 VITALS — BP 165/95 | HR 74 | Temp 98.2°F | Resp 16

## 2024-04-01 DIAGNOSIS — J069 Acute upper respiratory infection, unspecified: Secondary | ICD-10-CM

## 2024-04-01 LAB — POC SOFIA SARS ANTIGEN FIA: SARS Coronavirus 2 Ag: NEGATIVE

## 2024-04-01 MED ORDER — FLUTICASONE PROPIONATE 50 MCG/ACT NA SUSP: 2.0000 | Freq: Every day | NASAL | 0 refills | Status: AC

## 2024-04-01 MED ORDER — BENZONATATE 200 MG PO CAPS: 200.0000 mg | ORAL_CAPSULE | Freq: Three times a day (TID) | ORAL | 0 refills | Status: AC | PRN

## 2024-04-01 NOTE — Telephone Encounter (Signed)
 Please call office to schedule appt w Dr Raford for continued refills. [phone] Thank you  2nd Attempt

## 2024-04-01 NOTE — Discharge Instructions (Addendum)
 Your exam does not show any signs of bacterial infection. Looks like you have a viral respiratory infection.

## 2024-05-16 ENCOUNTER — Encounter: Admitting: Internal Medicine
